# Patient Record
Sex: Female | Born: 1935 | Race: White | Hispanic: No | Smoking: Former smoker
Health system: Southern US, Community
[De-identification: ages and names within clinical notes are randomized; demographics above are authoritative.]

## PROBLEM LIST (undated history)

## (undated) DIAGNOSIS — D751 Secondary polycythemia: Secondary | ICD-10-CM

## (undated) DIAGNOSIS — T4145XA Adverse effect of unspecified anesthetic, initial encounter: Secondary | ICD-10-CM

## (undated) DIAGNOSIS — I499 Cardiac arrhythmia, unspecified: Secondary | ICD-10-CM

## (undated) DIAGNOSIS — K219 Gastro-esophageal reflux disease without esophagitis: Secondary | ICD-10-CM

## (undated) DIAGNOSIS — I219 Acute myocardial infarction, unspecified: Secondary | ICD-10-CM

## (undated) DIAGNOSIS — C801 Malignant (primary) neoplasm, unspecified: Secondary | ICD-10-CM

## (undated) DIAGNOSIS — T8859XA Other complications of anesthesia, initial encounter: Secondary | ICD-10-CM

## (undated) DIAGNOSIS — M1990 Unspecified osteoarthritis, unspecified site: Secondary | ICD-10-CM

## (undated) HISTORY — PX: EYE SURGERY: SHX253

## (undated) HISTORY — PX: SKIN CANCER EXCISION: SHX779

## (undated) HISTORY — PX: COLONOSCOPY W/ POLYPECTOMY: SHX1380

## (undated) HISTORY — PX: MASTECTOMY: SHX3

## (undated) HISTORY — PX: OTHER SURGICAL HISTORY: SHX169

## (undated) HISTORY — PX: ACHILLES TENDON SURGERY: SHX542

## (undated) HISTORY — PX: TONSILLECTOMY: SUR1361

## (undated) NOTE — *Deleted (*Deleted)
Patient is a 31 year old female presenting today for persistent hand pain in the right hand.  Patient reports she had a fall yesterday and was evaluated in the emergency room.  She had plain films of the hands and forearm which were negative for acute fracture.  She was placed in a Velcro wrist splint but reports she removed it because the pain has become severe.  She took a 47-year-old tramadol at home and reports it did not help with her pain.  Patient's hand is significantly swollen and bruised and appears that there may be an occult fracture.  There is no warmth or signs of compartment syndrome.  She has less than 3-second capillary refill in all fingers and palpable radial pulse.  Patient has no signs of injury elsewhere and denies any new falls.  Will attempt pain control.  Ice was applied hand was elevated and patient was given Voltaren gel, tramadol and Tylenol.  At this time do not feel that patient needs repeat imaging.

---
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
## 1992-08-23 HISTORY — PX: LAMINECTOMY: SHX219

## 1998-08-23 HISTORY — PX: ANKLE FRACTURE SURGERY: SHX122

## 1999-08-24 HISTORY — PX: FASCIOTOMY: SHX132

## 2003-08-24 HISTORY — PX: ABDOMINAL HYSTERECTOMY: SHX81

## 2005-08-23 HISTORY — PX: MENISCUS REPAIR: SHX5179

## 2009-12-05 DIAGNOSIS — H02409 Unspecified ptosis of unspecified eyelid: Secondary | ICD-10-CM

## 2009-12-05 HISTORY — DX: Unspecified ptosis of unspecified eyelid: H02.409

## 2010-08-23 HISTORY — PX: HERNIA REPAIR: SHX51

## 2012-07-11 DIAGNOSIS — R55 Syncope and collapse: Secondary | ICD-10-CM

## 2012-07-11 DIAGNOSIS — Z853 Personal history of malignant neoplasm of breast: Secondary | ICD-10-CM

## 2012-07-11 HISTORY — DX: Personal history of malignant neoplasm of breast: Z85.3

## 2012-07-11 HISTORY — DX: Syncope and collapse: R55

## 2012-08-23 HISTORY — PX: HAMMER TOE SURGERY: SHX385

## 2013-08-23 HISTORY — PX: SHOULDER OPEN ROTATOR CUFF REPAIR: SHX2407

## 2013-11-29 DIAGNOSIS — M75120 Complete rotator cuff tear or rupture of unspecified shoulder, not specified as traumatic: Secondary | ICD-10-CM | POA: Insufficient documentation

## 2013-11-29 HISTORY — DX: Complete rotator cuff tear or rupture of unspecified shoulder, not specified as traumatic: M75.120

## 2013-12-06 DIAGNOSIS — M7500 Adhesive capsulitis of unspecified shoulder: Secondary | ICD-10-CM | POA: Insufficient documentation

## 2013-12-06 HISTORY — DX: Adhesive capsulitis of unspecified shoulder: M75.00

## 2016-08-23 HISTORY — PX: FEMUR SURGERY: SHX943

## 2018-07-17 DIAGNOSIS — I4891 Unspecified atrial fibrillation: Secondary | ICD-10-CM

## 2018-07-17 DIAGNOSIS — D45 Polycythemia vera: Secondary | ICD-10-CM | POA: Insufficient documentation

## 2018-07-17 HISTORY — DX: Polycythemia vera: D45

## 2018-07-17 HISTORY — DX: Unspecified atrial fibrillation: I48.91

## 2018-07-24 DIAGNOSIS — I10 Essential (primary) hypertension: Secondary | ICD-10-CM

## 2018-07-24 DIAGNOSIS — L570 Actinic keratosis: Secondary | ICD-10-CM | POA: Insufficient documentation

## 2018-07-24 DIAGNOSIS — Z86006 Personal history of melanoma in-situ: Secondary | ICD-10-CM | POA: Insufficient documentation

## 2018-07-24 DIAGNOSIS — L111 Transient acantholytic dermatosis [Grover]: Secondary | ICD-10-CM | POA: Insufficient documentation

## 2018-07-24 DIAGNOSIS — Z85828 Personal history of other malignant neoplasm of skin: Secondary | ICD-10-CM

## 2018-07-24 DIAGNOSIS — Z9889 Other specified postprocedural states: Secondary | ICD-10-CM

## 2018-07-24 DIAGNOSIS — E785 Hyperlipidemia, unspecified: Secondary | ICD-10-CM

## 2018-07-24 HISTORY — DX: Actinic keratosis: L57.0

## 2018-07-24 HISTORY — DX: Personal history of melanoma in-situ: Z86.006

## 2018-07-24 HISTORY — DX: Other specified postprocedural states: Z98.890

## 2018-07-24 HISTORY — DX: Hyperlipidemia, unspecified: E78.5

## 2018-07-24 HISTORY — DX: Transient acantholytic dermatosis (grover): L11.1

## 2018-07-24 HISTORY — DX: Essential (primary) hypertension: I10

## 2018-07-24 HISTORY — DX: Personal history of other malignant neoplasm of skin: Z85.828

## 2019-01-30 DIAGNOSIS — S62327A Displaced fracture of shaft of fifth metacarpal bone, left hand, initial encounter for closed fracture: Secondary | ICD-10-CM | POA: Insufficient documentation

## 2019-03-06 ENCOUNTER — Ambulatory Visit (INDEPENDENT_AMBULATORY_CARE_PROVIDER_SITE_OTHER): Payer: Medicare Other | Admitting: Osteopathic Medicine

## 2019-03-06 ENCOUNTER — Other Ambulatory Visit: Payer: Self-pay

## 2019-03-06 ENCOUNTER — Encounter: Payer: Self-pay | Admitting: Osteopathic Medicine

## 2019-03-06 VITALS — BP 142/72 | HR 74 | Temp 98.1°F | Wt 139.1 lb

## 2019-03-06 DIAGNOSIS — D45 Polycythemia vera: Secondary | ICD-10-CM

## 2019-03-06 DIAGNOSIS — I252 Old myocardial infarction: Secondary | ICD-10-CM

## 2019-03-06 DIAGNOSIS — L821 Other seborrheic keratosis: Secondary | ICD-10-CM

## 2019-03-06 DIAGNOSIS — L603 Nail dystrophy: Secondary | ICD-10-CM | POA: Insufficient documentation

## 2019-03-06 DIAGNOSIS — L814 Other melanin hyperpigmentation: Secondary | ICD-10-CM | POA: Insufficient documentation

## 2019-03-06 DIAGNOSIS — Z8679 Personal history of other diseases of the circulatory system: Secondary | ICD-10-CM

## 2019-03-06 HISTORY — DX: Old myocardial infarction: I25.2

## 2019-03-06 HISTORY — DX: Other seborrheic keratosis: L82.1

## 2019-03-06 HISTORY — DX: Nail dystrophy: L60.3

## 2019-03-06 HISTORY — DX: Other melanin hyperpigmentation: L81.4

## 2019-03-06 NOTE — Patient Instructions (Signed)
  DR. Redgie Grayer IMPORTANT  INFORMATION FOR NEW PATIENTS!  PLEASE REVIEW CAREFULLY!    FOLLOW-UP!   Let's plan to follow-up in the office in 6 months for Medicare Wellness exam.   Long term, let's plan to follow-up every 6 months for management/monitoring of chronic medical issues, as well as management of your prescription medications.   Please don't hesitate to make an appointment sooner if you're having any acute concerns or problems!  REGARDING PRESCRIPTION MEDICATIONS  Please let your pharmacy know when you are running low on medications/refills (do not wait until you are out of medicines). Your pharmacy will send our office a request for the appropriate medications. Please allow our office 2-3 business days to process the needed refills.    REGARDING ANY CARE YOU RECEIVE OUTSIDE OUR OFFICE  At any visits to any specialists, or if you receive vaccines anywhere outside our office, please provide our clinic information so that they can forward [place] any records, including any tests which are done, changes to your medications, or new vaccinations.   Also, if you are ever treated in an emergency room or if you are admitted to the hospital, please contact our office after your discharge. We encourage our patients to schedule a follow-up visit with their PCP any time they are treated for a serious illness or injury.   This allows all your physicians to communicate effectively, putting your primary care doctor at the center of your medical care and allowing [place] to effectively coordinate your care.  Specialists can forward records to [place] at fax # [phone].    Please let us know if there is anything else we can do for you. Take care! -Dr. Loni Muse.

## 2019-03-06 NOTE — Progress Notes (Signed)
HPI: [name] is a 85 y.o. female who  has no past medical history on file.  she presents to [facility] today, 03/06/19,  for chief complaint of: New to establish care - see headings below    CARDIOVASCULAR Hx:   Atrial fibrillation  Was following w/ Dr Sheppard Plumber in [place], last visit 05/2018, goal BP <150/90, reported patient was in normal sinus rhythm at that point.  She says she has upcoming appointment with a new local cardiologist. 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 Hydroxyurea initiated June 2017 secondary to rising platelet count and leukocytosis. Current dosing 500 mg twice daily Monday through Friday, daily on Saturday and Sunday. Goal hematocrit less than 42%."  S/p bilateral mastectomy for breast cancer  Meds:   Hydroxyurea        Past medical, surgical, social and family history reviewed:  Patient Active Problem List   Diagnosis Date Noted  . Brittle nails 03/06/2019  . Lentigines 03/06/2019  . Past myocardial infarction 03/06/2019  . Seborrheic keratosis 03/06/2019  . Closed fracture of shaft of fifth metacarpal bone of left hand 01/30/2019  . Actinic keratoses 07/24/2018  . Benign essential HTN 07/24/2018  . 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 Hx of basal cell carcinoma excision 07/24/2018  . 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 Hx of squamous cell carcinoma of skin 07/24/2018  . Hyperlipidemia 07/24/2018  . Atrial fibrillation ([place]) 07/17/2018  . Polycythemia vera ([place]) 07/17/2018  . Adhesive capsulitis of shoulder 12/06/2013  . Complete rupture of rotator cuff 11/29/2013  . History of breast cancer 07/11/2012  . 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 Ptosis of eyelid 12/05/2009    History reviewed. No  pertinent surgical history.  Social History   Tobacco Use  . Smoking status: Not on file  Substance Use Topics  . Alcohol use: Not on file    No family history on file.   Current medication list and allergy/intolerance information reviewed:    No current outpatient medications on file.   No current facility-administered medications for this visit.     Not on File    Review of Systems:  Constitutional:  No  fever, no chills, No recent illness, No unintentional weight changes. No significant fatigue.   HEENT: No  headache, no vision change, no hearing change, No sore throat, No  sinus pressure  Cardiac: No  chest pain, No  pressure, No palpitations, No  Orthopnea  Respiratory:  No  shortness of breath. No  Cough  Gastrointestinal: No  abdominal pain, No  nausea, No  vomiting,  No  blood in stool, No  diarrhea, No  constipation   Musculoskeletal: No new myalgia/arthralgia  Skin: No  Rash, No other wounds/concerning lesions  Genitourinary: No  incontinence, No  abnormal genital bleeding, No abnormal genital discharge  Hem/Onc: No  easy bruising/bleeding, No  abnormal lymph node  Endocrine: No cold intolerance,  No heat intolerance. No polyuria/polydipsia/polyphagia   Neurologic: No  weakness, No  dizziness, No  slurred speech/focal weakness/facial droop  Psychiatric: No  concerns with depression, No  concerns with anxiety, No sleep problems, No mood problems  Exam:  BP (!) 142/72 (BP Location: Left Arm, Patient Position: Sitting, Cuff Size: Normal)   Pulse 74   Temp 98.1  F (36.7 C) (Oral)   Wt 139 lb 1.6 oz (63.1 kg)   SpO2 98%   Constitutional: VS see above. General Appearance: alert, well-developed, well-nourished, NAD  Eyes: Normal lids and conjunctive, non-icteric sclera  Neck: No masses, trachea midline. No thyroid enlargement. No tenderness/mass appreciated. No lymphadenopathy  Respiratory: Normal respiratory effort. no wheeze, no rhonchi, no  rales  Cardiovascular: S1/S2 normal, no murmur, no rub/gallop auscultated. RRR. No lower extremity edema.  Gastrointestinal: Nontender, no masses. No hepatomegaly, no splenomegaly. No hernia appreciated. Bowel sounds normal. Rectal exam deferred.   Musculoskeletal: Gait normal. No clubbing/cyanosis of digits.   Neurological: Normal balance/coordination. No tremor. No cranial nerve deficit on limited exam. Motor and sensation intact and symmetric. Cerebellar reflexes intact.   Skin: warm, dry, intact. No rash/ulcer. No concerning nevi or subq nodules on limited exam.    Psychiatric: Normal judgment/insight. Normal mood and affect. Oriented x3.    No results found for this or any previous visit (from the past 72 hour(s)).  No results found.   ASSESSMENT/PLAN: The primary encounter diagnosis was Polycythemia vera ([place]). A diagnosis of History of atrial fibrillation was also pertinent to this visit.  Patient doing fairly well on current medications, following with several different specialists and seems like everything is pretty well under control.  She has no complaints today.  Advised follow-up with me every 6 months, sooner if needed.    Patient Instructions   DR. Octave Montrose'S IMPORTANT  INFORMATION FOR NEW PATIENTS!  PLEASE REVIEW CAREFULLY!    FOLLOW-UP!   Let's plan to follow-up in the office in 6 months for Medicare Wellness exam.   Long term, let's plan to follow-up every 6 months for management/monitoring of chronic medical issues, as well as management of your prescription medications.   Please don't hesitate to make an appointment sooner if you're having any acute concerns or problems!  REGARDING PRESCRIPTION MEDICATIONS  Please let your pharmacy know when you are running low on medications/refills (do not wait until you are out of medicines). Your pharmacy will send our office a request for the appropriate medications. Please allow our office 2-3 business days to  process the needed refills.    REGARDING ANY CARE YOU RECEIVE OUTSIDE OUR OFFICE  At any visits to any specialists, or if you receive vaccines anywhere outside our office, please provide our clinic information so that they can forward [place] any records, including any tests which are done, changes to your medications, or new vaccinations.   Also, if you are ever treated in an emergency room or if you are admitted to the hospital, please contact our office after your discharge. We encourage our patients to schedule a follow-up visit with their PCP any time they are treated for a serious illness or injury.   This allows all your physicians to communicate effectively, putting your primary care doctor at the center of your medical care and allowing [place] to effectively coordinate your care.  Specialists can forward records to [place] at fax # [phone].    Please let us know if there is anything else we can do for you. Take care! -Dr. Loni Muse.        Visit summary with medication list and pertinent instructions was printed for patient to review. All questions at time of visit were answered - patient instructed to contact office with any additional concerns or updates. ER/RTC precautions were reviewed with the patient.   Note: Total time spent 45 minutes, greater than 50% of the visit  was spent face-to-face counseling and coordinating care for the above diagnoses listed in assessment/plan.   Please note: voice recognition software was used to produce this document, and typos may escape review. Please contact Dr. Sheppard Coil for any needed clarifications.     Follow-up plan: Return in about 6 months (around 09/06/2019) for [place] .

## 2019-03-09 ENCOUNTER — Encounter: Payer: Self-pay | Admitting: Osteopathic Medicine

## 2019-06-26 ENCOUNTER — Telehealth: Payer: Self-pay | Admitting: Osteopathic Medicine

## 2019-06-26 NOTE — Telephone Encounter (Signed)
If records are being requested, that's fine, get appropriate releases from patient and ok to send.  If needs surgical clearance, will need appt. Sounds like shes just asking for records.

## 2019-06-26 NOTE — Telephone Encounter (Signed)
Pt called. She is having surgery for a knee replacement on December 1. She is requesting that we send off for her records from her previous pcp. Do you need to do surgical clearance? Thanks.

## 2019-06-27 ENCOUNTER — Other Ambulatory Visit: Payer: Self-pay | Admitting: Orthopaedic Surgery

## 2019-06-27 NOTE — Telephone Encounter (Signed)
I shouldn't need records, usually everything the surgeon would need to know needs to be done within a few weeks of surgery anyway (updated labs etc). She should check with her surgeon - if they are requesting surgical clearance, she will need appointment with me, and we will need forms from her surgeon (she can bring forms with her to appointment or request that her surgeon fax them to [place])

## 2019-06-27 NOTE — Telephone Encounter (Signed)
Okay. Patient is aware.  Thank  You.

## 2019-06-27 NOTE — Telephone Encounter (Signed)
I apologize, I didn't word the message correctly.  She is requesting records so that  you can have them to do her surgical clearance for her knee replacement. My question is, do you need records to do surgical clearance for knee replacement?  Her appointment is on December 1st.

## 2019-06-28 NOTE — Telephone Encounter (Signed)
[facility] Pre-Op form for pt placed in provider's box.

## 2019-06-29 NOTE — Telephone Encounter (Signed)
Great.  Thanks

## 2019-07-11 ENCOUNTER — Other Ambulatory Visit: Payer: Self-pay | Admitting: Orthopaedic Surgery

## 2019-07-11 NOTE — Care Plan (Signed)
Multiple conversations with patient prior to surgery. She plans to discharge to the [place] facility at [facility] for several days prior to returning to her apartment there. I have spoken with Luellen Pucker, admissions, [phone], and arrangements have been made for this. Please contact her for transportation at discharge.  Patient will need a rolling walker but this will be obtained at the facility. Orders for therapy have been sent to [facility] at [facility].  Patient and MD in agreement with plan. Choice offered.    Ladell Heads, [place]

## 2019-07-17 NOTE — Patient Instructions (Signed)
DUE TO COVID-19 ONLY ONE VISITOR IS ALLOWED TO COME WITH YOU AND STAY IN THE WAITING ROOM ONLY DURING PRE OP AND PROCEDURE DAY OF SURGERY. THE 1 VISITOR MAY VISIT WITH YOU AFTER SURGERY IN YOUR PRIVATE ROOM DURING VISITING HOURS ONLY!  YOU NEED TO HAVE A COVID 19 TEST ON___11-27____ @_______ , THIS TEST MUST BE DONE BEFORE SURGERY, COME  [place] , [id number].  ([place]) ONCE YOUR COVID TEST IS COMPLETED, PLEASE BEGIN THE QUARANTINE INSTRUCTIONS AS OUTLINED IN YOUR HANDOUT.                [name]    Your procedure is scheduled on: 12-1   Report to [facility]  Entrance    Report to admitting at 7:30AM     Call this number if you have problems the morning of surgery [phone]    NO SOLID FOOD AFTER MIDNIGHT THE NIGHT PRIOR TO SURGERY. YOU MAY DRINK CLEAR LIQUIDS.   STOP CLEAR LIQUIDS AT ___7:00AM______ AND THEN DRINK THE ENSURE PRE-SURGERY [place]. NOTHING BY MOUTH AFTER THE ENSURE DRINK!    CLEAR LIQUID DIET   Foods Allowed                                                                     Foods Excluded  Coffee and tea, regular and decaf                             liquids that you cannot  Plain Jell-O any favor except red or purple                                           see through such as: Fruit ices (not with fruit pulp)                                     milk, soups, orange juice  Iced Popsicles                                    All solid food Carbonated beverages, regular and diet                                    Cranberry, grape and apple juices Sports drinks like Gatorade Lightly seasoned clear broth or consume(fat free) Sugar, honey syrup  Sample Menu Breakfast                                Lunch                                     Supper Cranberry juice  Beef broth                            Chicken broth Jell-O                                     Grape juice                            Apple juice Coffee or tea                        Jell-O                                      Popsicle                                                Coffee or tea                        Coffee or tea  _____________________________________________________________________  . BRUSH YOUR TEETH MORNING OF SURGERY AND RINSE YOUR MOUTH OUT, NO CHEWING GUM CANDY OR MINTS.     Take these medicines the morning of surgery with A SIP OF WATER:                                  You may not have any metal on your body including hair pins and              piercings  Do not wear jewelry, make-up, lotions, powders or perfumes, deodorant             Do not wear nail polish on your fingernails.  Do not shave  48 hours prior to surgery.              Do not bring valuables to the hospital. [place].  Contacts, dentures or bridgework may not be worn into surgery.  YOU MAY BRING A SMALL OVERNIGHT BAG.                  Please read over the following fact sheets you were given: _____________________________________________________________________             [facility] - Preparing for Surgery Before surgery, you can play an important role.  Because skin is not sterile, your skin needs to be as free of germs as possible.  You can reduce the number of germs on your skin by washing with CHG (chlorahexidine gluconate) soap before surgery.  CHG is an antiseptic cleaner which kills germs and bonds with the skin to continue killing germs even after washing. Please DO NOT use if you have an allergy to CHG or antibacterial soaps.  If your skin becomes reddened/irritated stop using the CHG and inform your nurse when you arrive at Short Stay. Do not shave (including legs and underarms) for at least 48 hours prior to the first  CHG shower.  You may shave your face/neck. Please follow these instructions carefully:  1.  Shower with CHG Soap the night before surgery  and the  morning of Surgery.  2.  If you choose to wash your hair, wash your hair first as usual with your  normal  shampoo.  3.  After you shampoo, rinse your hair and body thoroughly to remove the  shampoo.                           4.  Use CHG as you would any other liquid soap.  You can apply chg directly  to the skin and wash                       Gently with a scrungie or clean washcloth.  5.  Apply the CHG Soap to your body ONLY FROM THE NECK DOWN.   Do not use on face/ open                           Wound or open sores. Avoid contact with eyes, ears mouth and genitals (private parts).                       Wash face,  Genitals (private parts) with your normal soap.             6.  Wash thoroughly, paying special attention to the area where your surgery  will be performed.  7.  Thoroughly rinse your body with warm water from the neck down.  8.  DO NOT shower/wash with your normal soap after using and rinsing off  the CHG Soap.                9.  Pat yourself dry with a clean towel.            10.  Wear clean pajamas.            11.  Place clean sheets on your bed the night of your first shower and do not  sleep with pets. Day of Surgery : Do not apply any lotions/deodorants the morning of surgery.  Please wear clean clothes to the hospital/surgery center.  FAILURE TO FOLLOW THESE INSTRUCTIONS MAY RESULT IN THE CANCELLATION OF YOUR SURGERY PATIENT SIGNATURE_________________________________  NURSE SIGNATURE__________________________________  ________________________________________________________________________   [name]  An incentive spirometer is a tool that can help keep your lungs clear and active. This tool measures how well you are filling your lungs with each breath. Taking long deep breaths may help reverse or decrease the chance of developing breathing (pulmonary) problems (especially infection) following:  A long period of time when you are unable to move or  be active. BEFORE THE PROCEDURE   If the spirometer includes an indicator to show your best effort, your nurse or respiratory therapist will set it to a desired goal.  If possible, sit up straight or lean slightly forward. Try not to slouch.  Hold the incentive spirometer in an upright position. INSTRUCTIONS FOR USE  1. Sit on the edge of your bed if possible, or sit up as far as you can in bed or on a chair. 2. Hold the incentive spirometer in an upright position. 3. Breathe out normally. 4. Place the mouthpiece in your mouth and seal your lips tightly around it. 5. Breathe in slowly and as  deeply as possible, raising the piston or the ball toward the top of the column. 6. Hold your breath for 3-5 seconds or for as long as possible. Allow the piston or ball to fall to the bottom of the column. 7. Remove the mouthpiece from your mouth and breathe out normally. 8. Rest for a few seconds and repeat Steps 1 through 7 at least 10 times every 1-2 hours when you are awake. Take your time and take a few normal breaths between deep breaths. 9. The spirometer may include an indicator to show your best effort. Use the indicator as a goal to work toward during each repetition. 10. After each set of 10 deep breaths, practice coughing to be sure your lungs are clear. If you have an incision (the cut made at the time of surgery), support your incision when coughing by placing a pillow or rolled up towels firmly against it. Once you are able to get out of bed, walk around indoors and cough well. You may stop using the incentive spirometer when instructed by your caregiver.  RISKS AND COMPLICATIONS  Take your time so you do not get dizzy or light-headed.  If you are in pain, you may need to take or ask for pain medication before doing incentive spirometry. It is harder to take a deep breath if you are having pain. AFTER USE  Rest and breathe slowly and easily.  It can be helpful to keep track of a log  of your progress. Your caregiver can provide you with a simple table to help with this. If you are using the spirometer at home, follow these instructions: [place] IF:   You are having difficultly using the spirometer.  You have trouble using the spirometer as often as instructed.  Your pain medication is not giving enough relief while using the spirometer.  You develop fever of 100.5 F (38.1 C) or higher. SEEK IMMEDIATE MEDICAL CARE IF:   You cough up bloody sputum that had not been present before.  You develop fever of 102 F (38.9 C) or greater.  You develop worsening pain at or near the incision site. MAKE SURE YOU:   Understand these instructions.  Will watch your condition.  Will get help right away if you are not doing well or get worse. Document Released: 12/20/2006 Document Revised: 11/01/2011 Document Reviewed: 02/20/2007 [facility] Patient Information 2014 [place], [place].   ________________________________________________________________________

## 2019-07-18 ENCOUNTER — Encounter (HOSPITAL_COMMUNITY)
Admission: RE | Admit: 2019-07-18 | Discharge: 2019-07-18 | Disposition: A | Discharge status: Home / Self Care | Payer: Medicare Other | Source: Ambulatory Visit | Attending: Osteopathic Medicine | Admitting: Osteopathic Medicine

## 2019-07-24 ENCOUNTER — Encounter (HOSPITAL_COMMUNITY): Admission: RE | Payer: Self-pay | Source: Home / Self Care

## 2019-07-24 ENCOUNTER — Ambulatory Visit (HOSPITAL_COMMUNITY): Admission: RE | Admit: 2019-07-24 | Payer: Medicare Other | Source: Home / Self Care | Admitting: Orthopaedic Surgery

## 2019-07-24 SURGERY — ARTHROPLASTY, KNEE, TOTAL
Anesthesia: Spinal | Site: Knee | Laterality: Right

## 2019-08-07 ENCOUNTER — Telehealth: Payer: Self-pay

## 2019-08-07 NOTE — Telephone Encounter (Signed)
NOTES FROM [place]

## 2019-08-07 NOTE — Telephone Encounter (Signed)
NOTES ON FILE FROM [facility] IN [place] , SENT REFERRAL TO SCHEDULING

## 2019-08-09 ENCOUNTER — Telehealth: Payer: Self-pay

## 2019-08-09 NOTE — Telephone Encounter (Signed)
NOTES ON FILE FROM [facility] [phone] SENT REFERRAL TO [place]

## 2019-08-20 ENCOUNTER — Other Ambulatory Visit: Payer: Self-pay | Admitting: Orthopaedic Surgery

## 2019-08-28 ENCOUNTER — Encounter: Payer: Medicare Other | Admitting: Osteopathic Medicine

## 2019-08-28 NOTE — Patient Instructions (Addendum)
DUE TO COVID-19 ONLY ONE VISITOR IS ALLOWED TO COME WITH YOU AND STAY IN THE WAITING ROOM ONLY DURING PRE OP AND PROCEDURE DAY OF SURGERY. THE 1 VISITOR MAY VISIT WITH YOU AFTER SURGERY IN YOUR PRIVATE ROOM DURING VISITING HOURS ONLY!  YOU NEED TO HAVE A COVID 19 TEST ON 08-31-19 @ 11:15 AM, THIS TEST MUST BE DONE BEFORE SURGERY, COME  [name], [name] , [address].  ([place]) ONCE YOUR COVID TEST IS COMPLETED, PLEASE BEGIN THE QUARANTINE INSTRUCTIONS AS OUTLINED IN YOUR HANDOUT.                [name]  08/28/2019   Your procedure is scheduled on: 09-04-19   Report to [facility] Main  Entrance    Report to Admitting at 9:53 AM     Call this number if you have problems the morning of surgery [phone]    Remember: NO SOLID FOOD AFTER MIDNIGHT THE NIGHT PRIOR TO SURGERY. NOTHING BY MOUTH EXCEPT CLEAR LIQUIDS UNTIL 9:23 AM . PLEASE FINISH ENSURE DRINK PER SURGEON ORDER  WHICH NEEDS TO BE COMPLETED AT 9:23 AM .   CLEAR LIQUID DIET   Foods Allowed                                                                     Foods Excluded  Coffee and tea, regular and decaf                             liquids that you cannot  Plain Jell-O any favor except red or purple                                           see through such as: Fruit ices (not with fruit pulp)                                     milk, soups, orange juice  Iced Popsicles                                    All solid food Carbonated beverages, regular and diet                                    Cranberry, grape and apple juices Sports drinks like Gatorade Lightly seasoned clear broth or consume(fat free) Sugar, honey syrup  Sample Menu Breakfast                                Lunch                                     Supper Cranberry juice  Beef broth                            Chicken broth Jell-O                                     Grape juice                            Apple juice Coffee or tea                        Jell-O                                      Popsicle                                                Coffee or tea                        Coffee or tea  _____________________________________________________________________        Take these medicines the morning of surgery with A SIP OF WATER: None  BRUSH YOUR TEETH MORNING OF SURGERY AND RINSE YOUR MOUTH OUT, NO CHEWING GUM CANDY OR MINTS.                              You may not have any metal on your body including hair pins and              piercings     Do not wear jewelry, make-up, lotions, powders or perfumes, deodorant              Do not wear nail polish on your fingernails.  Do not shave  48 hours prior to surgery.              Do not bring valuables to the hospital. [place].  Contacts, dentures or bridgework may not be worn into surgery.  You may bring an overnight bag     Special Instructions: N/A              Please read over the following fact sheets you were given: _____________________________________________________________________             [facility] - Preparing for Surgery Before surgery, you can play an important role.  Because skin is not sterile, your skin needs to be as free of germs as possible.  You can reduce the number of germs on your skin by washing with CHG (chlorahexidine gluconate) soap before surgery.  CHG is an antiseptic cleaner which kills germs and bonds with the skin to continue killing germs even after washing. Please DO NOT use if you have an allergy to CHG or antibacterial soaps.  If your skin becomes reddened/irritated stop using the CHG and inform your nurse when you arrive at Short Stay. Do not shave (including legs and underarms) for at least 48  hours prior to the first CHG shower.  You may shave your face/neck. Please follow these instructions carefully:  1.  Shower with  CHG Soap the night before surgery and the  morning of Surgery.  2.  If you choose to wash your hair, wash your hair first as usual with your  normal  shampoo.  3.  After you shampoo, rinse your hair and body thoroughly to remove the  shampoo.                           4.  Use CHG as you would any other liquid soap.  You can apply chg directly  to the skin and wash                       Gently with a scrungie or clean washcloth.  5.  Apply the CHG Soap to your body ONLY FROM THE NECK DOWN.   Do not use on face/ open                           Wound or open sores. Avoid contact with eyes, ears mouth and genitals (private parts).                       Wash face,  Genitals (private parts) with your normal soap.             6.  Wash thoroughly, paying special attention to the area where your surgery  will be performed.  7.  Thoroughly rinse your body with warm water from the neck down.  8.  DO NOT shower/wash with your normal soap after using and rinsing off  the CHG Soap.                9.  Pat yourself dry with a clean towel.            10.  Wear clean pajamas.            11.  Place clean sheets on your bed the night of your first shower and do not  sleep with pets. Day of Surgery : Do not apply any lotions/deodorants the morning of surgery.  Please wear clean clothes to the hospital/surgery center.  FAILURE TO FOLLOW THESE INSTRUCTIONS MAY RESULT IN THE CANCELLATION OF YOUR SURGERY PATIENT SIGNATURE_________________________________  NURSE SIGNATURE__________________________________  ________________________________________________________________________   [name]  An incentive spirometer is a tool that can help keep your lungs clear and active. This tool measures how well you are filling your lungs with each breath. Taking long deep breaths may help reverse or decrease the chance of developing breathing (pulmonary) problems (especially infection) following:  A long period of  time when you are unable to move or be active. BEFORE THE PROCEDURE   If the spirometer includes an indicator to show your best effort, your nurse or respiratory therapist will set it to a desired goal.  If possible, sit up straight or lean slightly forward. Try not to slouch.  Hold the incentive spirometer in an upright position. INSTRUCTIONS FOR USE  1. Sit on the edge of your bed if possible, or sit up as far as you can in bed or on a chair. 2. Hold the incentive spirometer in an upright position. 3. Breathe out normally. 4. Place the mouthpiece in your mouth and seal your lips tightly around it. 5.  Breathe in slowly and as deeply as possible, raising the piston or the ball toward the top of the column. 6. Hold your breath for 3-5 seconds or for as long as possible. Allow the piston or ball to fall to the bottom of the column. 7. Remove the mouthpiece from your mouth and breathe out normally. 8. Rest for a few seconds and repeat Steps 1 through 7 at least 10 times every 1-2 hours when you are awake. Take your time and take a few normal breaths between deep breaths. 9. The spirometer may include an indicator to show your best effort. Use the indicator as a goal to work toward during each repetition. 10. After each set of 10 deep breaths, practice coughing to be sure your lungs are clear. If you have an incision (the cut made at the time of surgery), support your incision when coughing by placing a pillow or rolled up towels firmly against it. Once you are able to get out of bed, walk around indoors and cough well. You may stop using the incentive spirometer when instructed by your caregiver.  RISKS AND COMPLICATIONS  Take your time so you do not get dizzy or light-headed.  If you are in pain, you may need to take or ask for pain medication before doing incentive spirometry. It is harder to take a deep breath if you are having pain. AFTER USE  Rest and breathe slowly and easily.  It can  be helpful to keep track of a log of your progress. Your caregiver can provide you with a simple table to help with this. If you are using the spirometer at home, follow these instructions: [place] IF:   You are having difficultly using the spirometer.  You have trouble using the spirometer as often as instructed.  Your pain medication is not giving enough relief while using the spirometer.  You develop fever of 100.5 F (38.1 C) or higher. SEEK IMMEDIATE MEDICAL CARE IF:   You cough up bloody sputum that had not been present before.  You develop fever of 102 F (38.9 C) or greater.  You develop worsening pain at or near the incision site. MAKE SURE YOU:   Understand these instructions.  Will watch your condition.  Will get help right away if you are not doing well or get worse. Document Released: 12/20/2006 Document Revised: 11/01/2011 Document Reviewed: 02/20/2007 ExitCare Patient Information 2014 ExitCare, [place].   ________________________________________________________________________  WHAT IS A BLOOD TRANSFUSION? Blood Transfusion Information  A transfusion is the replacement of blood or some of its parts. Blood is made up of multiple cells which provide different functions.  Red blood cells carry oxygen and are used for blood loss replacement.  White blood cells fight against infection.  Platelets control bleeding.  Plasma helps clot blood.  Other blood products are available for specialized needs, such as hemophilia or other clotting disorders. BEFORE THE TRANSFUSION  Who gives blood for transfusions?   Healthy volunteers who are fully evaluated to make sure their blood is safe. This is blood bank blood. Transfusion therapy is the safest it has ever been in the practice of medicine. Before blood is taken from a donor, a complete history is taken to make sure that person has no history of diseases nor engages in risky social behavior (examples are  intravenous drug use or sexual activity with multiple partners). The donor's travel history is screened to minimize risk of transmitting infections, such as malaria. The donated blood is  tested for signs of infectious diseases, such as HIV and hepatitis. The blood is then tested to be sure it is compatible with you in order to minimize the chance of a transfusion reaction. If you or a relative donates blood, this is often done in anticipation of surgery and is not appropriate for emergency situations. It takes many days to process the donated blood. RISKS AND COMPLICATIONS Although transfusion therapy is very safe and saves many lives, the main dangers of transfusion include:   Getting an infectious disease.  Developing a transfusion reaction. This is an allergic reaction to something in the blood you were given. Every precaution is taken to prevent this. The decision to have a blood transfusion has been considered carefully by your caregiver before blood is given. Blood is not given unless the benefits outweigh the risks. AFTER THE TRANSFUSION  Right after receiving a blood transfusion, you will usually feel much better and more energetic. This is especially true if your red blood cells have gotten low (anemic). The transfusion raises the level of the red blood cells which carry oxygen, and this usually causes an energy increase.  The nurse administering the transfusion will monitor you carefully for complications. HOME CARE INSTRUCTIONS  No special instructions are needed after a transfusion. You may find your energy is better. Speak with your caregiver about any limitations on activity for underlying diseases you may have. SEEK MEDICAL CARE IF:   Your condition is not improving after your transfusion.  You develop redness or irritation at the intravenous (IV) site. SEEK IMMEDIATE MEDICAL CARE IF:  Any of the following symptoms occur over the next 12 hours:  Shaking chills.  You have a  temperature by mouth above 102 F (38.9 C), not controlled by medicine.  Chest, back, or muscle pain.  People around you feel you are not acting correctly or are confused.  Shortness of breath or difficulty breathing.  Dizziness and fainting.  You get a rash or develop hives.  You have a decrease in urine output.  Your urine turns a dark color or changes to pink, red, or brown. Any of the following symptoms occur over the next 10 days:  You have a temperature by mouth above 102 F (38.9 C), not controlled by medicine.  Shortness of breath.  Weakness after normal activity.  The white part of the eye turns yellow (jaundice).  You have a decrease in the amount of urine or are urinating less often.  Your urine turns a dark color or changes to pink, red, or brown. Document Released: 08/06/2000 Document Revised: 11/01/2011 Document Reviewed: 03/25/2008 [facility] Patient Information 2014 [place], [place].  _______________________________________________________________________

## 2019-08-29 ENCOUNTER — Encounter (HOSPITAL_COMMUNITY)
Admission: RE | Admit: 2019-08-29 | Discharge: 2019-08-29 | Disposition: A | Discharge status: Home / Self Care | Payer: Medicare Other | Source: Ambulatory Visit | Attending: Orthopaedic Surgery | Admitting: Orthopaedic Surgery

## 2019-08-29 ENCOUNTER — Encounter (HOSPITAL_COMMUNITY): Payer: Self-pay | Admitting: *Deleted

## 2019-08-29 ENCOUNTER — Other Ambulatory Visit: Payer: Self-pay

## 2019-08-29 DIAGNOSIS — Z01812 Encounter for preprocedural laboratory examination: Secondary | ICD-10-CM | POA: Insufficient documentation

## 2019-08-29 HISTORY — DX: Unspecified osteoarthritis, unspecified site: M19.90

## 2019-08-29 HISTORY — DX: Gastro-esophageal reflux disease without esophagitis: K21.9

## 2019-08-29 HISTORY — DX: Secondary polycythemia: D75.1

## 2019-08-29 HISTORY — DX: Cardiac arrhythmia, unspecified: I49.9

## 2019-08-29 HISTORY — DX: Malignant (primary) neoplasm, unspecified: C80.1

## 2019-08-29 HISTORY — DX: Other complications of anesthesia, initial encounter: T88.59XA

## 2019-08-29 HISTORY — DX: Acute myocardial infarction, unspecified: I21.9

## 2019-08-30 ENCOUNTER — Encounter (HOSPITAL_COMMUNITY): Payer: Self-pay | Admitting: Physician Assistant

## 2019-08-31 ENCOUNTER — Other Ambulatory Visit (HOSPITAL_COMMUNITY): Payer: Medicare Other

## 2019-08-31 ENCOUNTER — Encounter (HOSPITAL_COMMUNITY)
Admission: RE | Admit: 2019-08-31 | Payer: Medicare Other | Source: Ambulatory Visit | Attending: Orthopedic Surgery | Admitting: Orthopedic Surgery

## 2019-09-04 ENCOUNTER — Ambulatory Visit (HOSPITAL_COMMUNITY): Admission: RE | Admit: 2019-09-04 | Payer: Medicare Other | Source: Ambulatory Visit | Admitting: Orthopaedic Surgery

## 2019-09-04 ENCOUNTER — Encounter (HOSPITAL_COMMUNITY): Admission: RE | Payer: Self-pay | Source: Ambulatory Visit

## 2019-09-04 SURGERY — ARTHROPLASTY, KNEE, TOTAL
Anesthesia: Spinal | Site: Knee | Laterality: Right

## 2019-10-01 NOTE — Progress Notes (Signed)
CARDIOLOGY CONSULT NOTE       Patient ID: [name] MRN: [id number] DOB/AGE: [birth year] 86 y.o.  Admit date: (Not on file) Referring Physician: Sheppard Coil  Primary Physician: Emeterio Reeve, DO Primary Cardiologist: New Reason for Consultation: Afib  Active Problems:   * No active hospital problems. *   HPI:  86 y.o. history of HLD, HTN and afib. Referred by Dr Sheppard Coil  Previously seen by [facility] in [place] Dr Sheppard Plumber in [place] until 05/2018 She is on chronic anticoagulation with Eliquis She also has polycythemia currently on Hydroxyurea . Breast Cancer post bilateral mastectomy PAF but has been in sinus since 2015 on flecainide [place] VASC 5 on eliquis Echo done 2018 had mild/moderate MR/TR with normal EF and mild/moderate LVH Cath in 2016 with no CAD Her BP can be labile Associated with anxiety.  she has had episodes of vasovagal syncope in past These may have been precipitated by hydralazine   She is widowed 5 years. Husband worked in Charity fundraiser and they enjoyed going to market in [place] they retired here She has a daughter in [place] and on in [place]. She enjoys golf  No cardiac issues Hct around 42 has not needed phlebotomy in 3 years. Had to have right brachial arterial Repair from stray iv stick at that time  Does not note any palpitations while in room with PVCls   ROS All other systems reviewed and negative except as noted above  Past Medical History:  Diagnosis Date  . Actinic keratoses 07/24/2018  . Adhesive capsulitis of shoulder 12/06/2013  . Arthritis   . Atrial fibrillation ([place]) 07/17/2018  . Benign essential HTN 07/24/2018   Last Assessment & Plan:  She is doing better with adjustments in her blood pressure medication and will continue this monitoring. Last Assessment & Plan:  She is doing better with adjustments in her blood pressure medication and will continue this monitoring.  . Brittle nails 03/06/2019  . Cancer ([place])    Breast  . Complete rupture of rotator cuff 11/29/2013  . Complication of anesthesia   . Dysrhythmia    A-Fib  . GERD (gastroesophageal reflux disease)   . 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 History of breast cancer 07/11/2012   Bilateral mastectomies with reconstruction.  Marland Kitchen Hx of basal cell carcinoma excision 07/24/2018  . 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 Hx of squamous cell carcinoma of skin 07/24/2018  . Hyperlipidemia 07/24/2018  . Lentigines 03/06/2019  . Myocardial infarction ([place])    Silent Heart Attack  . Past myocardial infarction 03/06/2019  . Polycythemia   . Polycythemia vera ([place]) 07/17/2018   Last Assessment & Plan:  The patient's blood counts remain in the desired range on her current dose of Hydrea.  She will continue this medication without dose modification.  She is moving to [facility] next week and we will help her arrange for necessary follow-up there.  She will return here on an as-needed basis.  . Ptosis of eyelid 12/05/2009  . Seborrheic keratosis 03/06/2019  . 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    Family History  Problem Relation Age of Onset  . Heart attack Father     Social History   Socioeconomic History  . Marital status: Widowed    Spouse name: Not on file  . Number of children: Not on file  . Years of education: Not on file  . Highest education level: Not on file  Occupational History  . Not on file  Tobacco Use  . Smoking status: Never Smoker  . Smokeless tobacco: Never Used  Substance and Sexual Activity  . Alcohol use: Not Currently  . Drug use: Never  . Sexual activity: Not Currently  Other Topics Concern  . Not on file  Social History Narrative  . Not on file   Social Determinants of Health   Financial Resource Strain:   . Difficulty of Paying Living Expenses: Not on file  Food Insecurity:   . Worried About Charity fundraiser in the Last Year: Not on file  . Ran Out of Food in the Last Year: Not on file  Transportation Needs:   . Lack  of Transportation (Medical): Not on file  . Lack of Transportation (Non-Medical): Not on file  Physical Activity:   . Days of Exercise per Week: Not on file  . Minutes of Exercise per Session: Not on file  Stress:   . Feeling of Stress : Not on file  Social Connections:   . Frequency of Communication with Friends and Family: Not on file  . Frequency of Social Gatherings with Friends and Family: Not on file  . Attends Religious Services: Not on file  . Active Member of Clubs or Organizations: Not on file  . Attends Archivist Meetings: Not on file  . Marital Status: Not on file  Intimate Partner Violence:   . Fear of Current or Ex-Partner: Not on file  . Emotionally Abused: Not on file  . Physically Abused: Not on file  . Sexually Abused: Not on file    Past Surgical History:  Procedure Laterality Date  . ABDOMINAL HYSTERECTOMY  2005  . ACHILLES TENDON SURGERY Right    2008  . ANKLE FRACTURE SURGERY Left 2000  . Breast Recontstruction Right    2004  . COLONOSCOPY W/ POLYPECTOMY    . EYE SURGERY Bilateral    Cataract  . FASCIOTOMY  2001  . FEMUR SURGERY  2018   Crushed Femur,   . HAMMER TOE SURGERY Bilateral 2014  . HERNIA REPAIR  2012   abdominal  . LAMINECTOMY  1994   L3-L4, L4-L5  (1987 C2-C3, C3-C4)  . MASTECTOMY Bilateral    1997-R, L -2004  . MENISCUS REPAIR Right 2007   arthroscopy  . SHOULDER OPEN ROTATOR CUFF REPAIR Right 2015  . SKIN CANCER EXCISION     basal, squamous, melonoma  . TONSILLECTOMY        Current Outpatient Medications:  .  apixaban (ELIQUIS) 5 MG TABS tablet, Take 5 mg by mouth 2 (two) times daily. , Disp: , Rfl:  .  Ascorbic Acid (VITAMIN C PO), Take 3 tablets by mouth daily., Disp: , Rfl:  .  Cholecalciferol (VITAMIN D3) 50 MCG (2000 UT) TABS, Take 2,000 Units by mouth daily., Disp: , Rfl:  .  Coenzyme Q10 (CO Q-10) 50 MG CAPS, Take 50 mg by mouth daily. , Disp: , Rfl:  .  hydroxyurea (HYDREA) 500 MG capsule, Take 500 mg by  mouth See admin instructions. Take 500 mg by mouth twice daily Monday-Friday and once daily on Saturday and Sunday, Disp: , Rfl:  .  losartan (COZAAR) 25 MG tablet, Take 25 mg by mouth as needed., Disp: , Rfl:  .  MAGNESIUM OXIDE 400 PO, Take 400 mg by mouth daily. , Disp: , Rfl:  .  Melatonin 10 MG TABS, Take 10 mg by mouth at bedtime. , Disp: , Rfl:  .  Multiple Vitamins-Minerals (MULTIVITAMIN ADULT PO), Take  1 tablet by mouth daily. , Disp: , Rfl:  .  Omega-3 Fatty Acids (OMEGA-3 EPA FISH OIL PO), Take 1 capsule by mouth 2 (two) times daily. EPA 300 mg DHA 200 mg, Disp: , Rfl:  .  OVER THE COUNTER MEDICATION, Take 300 mg by mouth daily. Green tea , Disp: , Rfl:     Physical Exam: Blood pressure 140/84, pulse 69, height 5\' 8"  (1.727 m), weight 141 lb 6.4 oz (64.1 kg), SpO2 98 %.    Affect appropriate Elderly female  HEENT: normal Neck supple with no adenopathy JVP normal no bruits no thyromegaly Lungs clear with no wheezing and good diaphragmatic motion Heart:  S1/S2 MR murmur, no rub, gallop or click PMI normal post bilateral mastectomy  Abdomen: benighn, BS positve, no tenderness, no AAA no bruit.  No HSM or HJR Distal pulses intact with no bruits No edema Neuro non-focal Skin warm and dry No muscular weakness   Labs:  No results found for: WBC, HGB, HCT, MCV, PLT No results for input(s): NA, K, CL, CO2, BUN, CREATININE, CALCIUM, PROT, BILITOT, ALKPHOS, ALT, AST, GLUCOSE in the last 168 hours.  Invalid input(s): LABALBU No results found for: CKTOTAL, CKMB, CKMBINDEX, TROPONINI No results found for: CHOL No results found for: HDL No results found for: LDLCALC No results found for: TRIG No results found for: CHOLHDL No results found for: LDLDIRECT    Radiology: No results found.  EKG: SR rate 69 PVC nonspecific ST changes QT 406   ASSESSMENT AND PLAN:   1. PAF:  On chronic eliquis for anticoagulation stable  2. HTN:  Well controlled.  Continue current medications  and low sodium Dash type diet.   3. P-vera:  Continue hydroxeurea labs with primary / hematology  4. Breast Cancer:  History of post bilateral mastectomy  5. MR/TR:  Historically mild/moderate will update echo   Signed: Jenkins Rouge 10/10/2019, 9:58 AM

## 2019-10-10 ENCOUNTER — Ambulatory Visit (INDEPENDENT_AMBULATORY_CARE_PROVIDER_SITE_OTHER): Payer: Medicare Other | Admitting: Cardiovascular Disease

## 2019-10-10 ENCOUNTER — Encounter (INDEPENDENT_AMBULATORY_CARE_PROVIDER_SITE_OTHER): Payer: Self-pay

## 2019-10-10 ENCOUNTER — Encounter: Payer: Self-pay | Admitting: Cardiovascular Disease

## 2019-10-10 ENCOUNTER — Other Ambulatory Visit: Payer: Self-pay

## 2019-10-10 VITALS — BP 140/84 | HR 69 | Ht 68.0 in | Wt 141.4 lb

## 2019-10-10 DIAGNOSIS — I493 Ventricular premature depolarization: Secondary | ICD-10-CM | POA: Diagnosis not present

## 2019-10-10 DIAGNOSIS — I340 Nonrheumatic mitral (valve) insufficiency: Secondary | ICD-10-CM

## 2019-10-10 NOTE — Patient Instructions (Addendum)
Medication Instructions:  *If you need a refill on your cardiac medications before your next appointment, please call your pharmacy*  Lab Work: If you have labs (blood work) drawn today and your tests are completely normal, you will receive your results only by: Marland Kitchen MyChart Message (if you have MyChart) OR . A paper copy in the mail If you have any lab test that is abnormal or we need to change your treatment, we will call you to review the results.  Testing/Procedures: Your physician has requested that you have an echocardiogram. Echocardiography is a painless test that uses sound waves to create images of your heart. It provides your doctor with information about the size and shape of your heart and how well your heart's chambers and valves are working. This procedure takes approximately one hour. There are no restrictions for this procedure.  Follow-Up: At [facility], you and your health needs are our priority.  As part of our continuing mission to provide you with exceptional heart care, we have created designated Provider Care Teams.  These Care Teams include your primary Cardiologist (physician) and Advanced Practice Providers (APPs -  Physician Assistants and Nurse Practitioners) who all work together to provide you with the care you need, when you need it.  Your next appointment:   1 year(s)  The format for your next appointment:   In Person  Provider:   You may see Dr. Johnsie Cancel or one of the following Advanced Practice Providers on your designated Care Team:    Truitt Merle, NP  Cecilie Kicks, NP  Kathyrn Drown, NP

## 2019-10-12 ENCOUNTER — Other Ambulatory Visit: Payer: Self-pay | Admitting: Cardiovascular Disease

## 2019-10-12 MED ORDER — LOSARTAN POTASSIUM 25 MG PO TABS: 25.0000 mg | ORAL_TABLET | ORAL | 3 refills | Status: DC | PRN

## 2019-10-12 MED ORDER — APIXABAN 5 MG PO TABS: 5.0000 mg | ORAL_TABLET | Freq: Two times a day (BID) | ORAL | 9 refills | Status: DC

## 2019-10-12 NOTE — Telephone Encounter (Signed)
Eliquis 5mg  refill request received, pt is 86 years old, weight-64.1kg, Crea-0.79 on 07/16/2019 via [facility]Everywhere, Diagnosis-Afib, and last seen by Dr. Johnsie Cancel on 10/10/2019. Dose is appropriate based on dosing criteria. Will send in refill to requested pharmacy.

## 2019-10-23 ENCOUNTER — Ambulatory Visit (HOSPITAL_COMMUNITY): Payer: Medicare Other | Attending: Cardiovascular Disease

## 2019-10-23 ENCOUNTER — Other Ambulatory Visit: Payer: Self-pay

## 2019-10-23 ENCOUNTER — Encounter (INDEPENDENT_AMBULATORY_CARE_PROVIDER_SITE_OTHER): Payer: Self-pay

## 2019-10-23 DIAGNOSIS — I340 Nonrheumatic mitral (valve) insufficiency: Secondary | ICD-10-CM | POA: Insufficient documentation

## 2019-10-23 DIAGNOSIS — I252 Old myocardial infarction: Secondary | ICD-10-CM | POA: Diagnosis not present

## 2019-10-23 DIAGNOSIS — I10 Essential (primary) hypertension: Secondary | ICD-10-CM | POA: Diagnosis not present

## 2019-10-23 DIAGNOSIS — E119 Type 2 diabetes mellitus without complications: Secondary | ICD-10-CM | POA: Insufficient documentation

## 2019-10-23 DIAGNOSIS — Z9013 Acquired absence of bilateral breasts and nipples: Secondary | ICD-10-CM | POA: Insufficient documentation

## 2019-10-23 DIAGNOSIS — I4891 Unspecified atrial fibrillation: Secondary | ICD-10-CM | POA: Diagnosis not present

## 2019-10-23 DIAGNOSIS — I493 Ventricular premature depolarization: Secondary | ICD-10-CM | POA: Insufficient documentation

## 2019-10-25 ENCOUNTER — Telehealth: Payer: Self-pay | Admitting: Cardiovascular Disease

## 2019-10-25 NOTE — Telephone Encounter (Signed)
Medical records requested from [place]. 10/25/19 vlm

## 2019-12-31 ENCOUNTER — Telehealth: Payer: Self-pay | Admitting: Cardiovascular Disease

## 2019-12-31 NOTE — Telephone Encounter (Signed)
Called patient and her daughter back.  Per Daughter patient has history of Polycythemia and A. FIB. Patient takes losartan 25 mg PRN and is on eliquis 5 mg BID.  Patient's daughter stated patient's BP and HR are fluctuating up and down. Patient has been in [place] at an elevation of 7500 feet for 2 weeks. Patient's SBP is in the 150's in the AM and gets down to 113 in the PM. HR ranges from 57 to 83 and O2 from 90 to 93.  Daughter thought it might be the elevation, but patient stated this is happening back in [place]. Patient has not been taking her losartan and daughter is giving patient a salty snack in the evenings for the low BP. Informed patient's daughter that maybe she can just give patient fluids when BP is low. Patient's daughter wants patient to be seen next week in person. Made patient an appointment on Macarthur Critchley PA schedule on next Monday. Will forward to Dr. Johnsie Cancel for further advisement.

## 2019-12-31 NOTE — Telephone Encounter (Signed)
Not clear what is up most important thing is to make sure she is not in afib again. BP not that low Should have Hb/Hct as well when she is seen with ECG

## 2019-12-31 NOTE — Telephone Encounter (Signed)
   Pt's daughter called, she said her mother is visiting her in [place]. Due to high altitude pt's BP HR and o2 put of control she said she seems like her heart is laboring. She doesn't know what to do if she needs to move her or if there's medication Dr. Johnsie Cancel can prescribe  Please advise

## 2019-12-31 NOTE — Telephone Encounter (Signed)
Called patient and her daughter back. Informed them of Dr. Kyla Balzarine message. Daughter stated she might take her to urgent care to get an ekg. Gave daughter our office fax number so they can send report if they go.

## 2020-01-02 NOTE — Progress Notes (Addendum)
CARDIOLOGY OFFICE NOTE  Date:  01/07/2020    [name] Date of Birth: 1930/02/22 Medical Record #[id number]  PCP:  Emeterio Reeve, DO  Cardiologist:  Johnsie Cancel  Chief Complaint  Patient presents with  . Follow-up    History of Present Illness: [name] is a 86 y.o. female who presents today for a work in visit. Seen for Dr. Johnsie Cancel.   She has a history of HLD, HTN and AF. Other issues include polycythemia - on Hydroxyurea, chronic anticoagulation with Eliquis, prior use of Flecainide, noted valvular heart disease, noted no CAD per prior cath in 2016, prior breast cancer with bilateral mastectomy. Noted labile HTN with anxiety. Has had prior vasovagal syncope in the past - may have been precipitated by hydralazine. She is from [place].   Last seen in February by Dr. Johnsie Cancel - felt to be doing ok. PVCs noted but not aware.   Phone call last week - "Called patient and her daughter back.  Per Daughter patient has history of Polycythemia and A. FIB. Patient takes losartan 25 mg PRN and is on eliquis 5 mg BID.  Patient's daughter stated patient's BP and HR are fluctuating up and down. Patient has been in [place] at an elevation of 7500 feet for 2 weeks. Patient's SBP is in the 150's in the AM and gets down to 113 in the PM. HR ranges from 57 to 83 and O2 from 90 to 93.  Daughter thought it might be the elevation, but patient stated this is happening back in [place]. Patient has not been taking her losartan and daughter is giving patient a salty snack in the evenings for the low BP. Informed patient's daughter that maybe she can just give patient fluids when BP is low. Patient's daughter wants patient to be seen next week in person. Made patient an appointment on Macarthur Critchley PA schedule on next Monday. Will forward to Dr. Johnsie Cancel for further advisemen."  Thus added to my schedule for today.    The patient does not have symptoms concerning for COVID-19 infection (fever, chills, cough, or  new shortness of breath).   Comes in today. Here alone. Her daughter [name] is on the phone. History is a little hard to follow. She has noted significant drops in her BP and pulse - thinks this was happening as far back as to her last visit here. She brings in a log over the last 2 weeks - may low BPs - down in the 44'I systolic - highest in the 160's .  HR variable - some readings in the 40's. Daughter feels like she may need PPM. Has been in [place] in the past 2 weeks with significant altitude. Now back home. She has had what sounds like progressive DOE - she thinks it may be worse. She then notes that she has has had several spells of passing out - while in the shower - does not remember all the details - no injury. This has happened thru the years.  Last spell about 3 weeks ago - while in the shower but multiple spells of reported presyncope. Daughter notes they have found her "shaking and laying on the floor". She has "been hiding her symptoms for a while". She denies chest pain. She has chronic edema.   EKG last week in the ER at [facility] with NSR with PACs - anterior Q's. Labs were fine. Normal kidney function, potassium, H&H, and negative Troponin. Mag level and TSH normal as well.  Past Medical History:  Diagnosis Date  . Actinic keratoses 07/24/2018  . Adhesive capsulitis of shoulder 12/06/2013  . Arthritis   . Atrial fibrillation ([place]) 07/17/2018  . Benign essential HTN 07/24/2018   Last Assessment & Plan:  She is doing better with adjustments in her blood pressure medication and will continue this monitoring. Last Assessment & Plan:  She is doing better with adjustments in her blood pressure medication and will continue this monitoring.  . Brittle nails 03/06/2019  . Cancer ([place])    Breast  . Complete rupture of rotator cuff 11/29/2013  . Complication of anesthesia   . Dysrhythmia    A-Fib  . GERD (gastroesophageal reflux disease)   . 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 History of  breast cancer 07/11/2012   Bilateral mastectomies with reconstruction.  Marland Kitchen Hx of basal cell carcinoma excision 07/24/2018  . 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 Hx of squamous cell carcinoma of skin 07/24/2018  . Hyperlipidemia 07/24/2018  . Lentigines 03/06/2019  . Myocardial infarction ([place])    Silent Heart Attack  . Past myocardial infarction 03/06/2019  . Polycythemia   . Polycythemia vera ([place]) 07/17/2018   Last Assessment & Plan:  The patient's blood counts remain in the desired range on her current dose of Hydrea.  She will continue this medication without dose modification.  She is moving to [facility] next week and we will help her arrange for necessary follow-up there.  She will return here on an as-needed basis.  . Ptosis of eyelid 12/05/2009  . Seborrheic keratosis 03/06/2019  . 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 ABDOMINAL HYSTERECTOMY  2005  . ACHILLES TENDON SURGERY Right    2008  . ANKLE FRACTURE SURGERY Left 2000  . Breast Recontstruction Right    2004  . COLONOSCOPY W/ POLYPECTOMY    . EYE SURGERY Bilateral    Cataract  . FASCIOTOMY  2001  . FEMUR SURGERY  2018   Crushed Femur,   . HAMMER TOE SURGERY Bilateral 2014  . HERNIA REPAIR  2012   abdominal  . LAMINECTOMY  1994   L3-L4, L4-L5  (1987 C2-C3, C3-C4)  . MASTECTOMY Bilateral    1997-R, L -2004  . MENISCUS REPAIR Right 2007   arthroscopy  . SHOULDER OPEN ROTATOR CUFF REPAIR Right 2015  . SKIN CANCER EXCISION     basal, squamous, melonoma  . TONSILLECTOMY       Medications: Current Meds  Medication Sig  . apixaban (ELIQUIS) 5 MG TABS tablet Take 1 tablet (5 mg total) by mouth 2 (two) times daily.  . Ascorbic Acid (VITAMIN C PO) Take 3 tablets by mouth daily.  . Cholecalciferol (VITAMIN D3) 50 MCG (2000 UT) TABS Take 2,000 Units by mouth daily.  . Coenzyme Q10 (CO Q-10) 50 MG CAPS Take 50 mg by mouth daily.   . hydroxyurea (HYDREA) 500 MG capsule  Take 500 mg by mouth See admin instructions. Take 500 mg by mouth twice daily Monday-Friday and once daily on Saturday and Sunday  . MAGNESIUM OXIDE 400 PO Take 400 mg by mouth daily.   . Melatonin 10 MG TABS Take 10 mg by mouth at bedtime.   . Multiple Vitamins-Minerals (MULTIVITAMIN ADULT PO) Take 1 tablet by mouth daily.   . Omega-3 Fatty Acids (OMEGA-3 EPA FISH OIL PO) Take 1 capsule by mouth 2 (two) times daily. EPA 300 mg DHA 200 mg  . OVER THE COUNTER  MEDICATION Take 300 mg by mouth daily. Green tea   . [DISCONTINUED] losartan (COZAAR) 25 MG tablet Take 1 tablet (25 mg total) by mouth as needed.     Allergies: Allergies  Allergen Reactions  . Bee Venom Anaphylaxis  . Sulfa Antibiotics Anaphylaxis  . Sulfamethoxazole Anaphylaxis  . Hydrocodone-Acetaminophen Nausea Only  . Meperidine Nausea And Vomiting  . Morphine Nausea And Vomiting  . Oxycodone Hcl Nausea Only  . Oxycodone-Acetaminophen Nausea Only  . Anesthesia S-I-40 [Propofol] Nausea Only  . Hydrocodone Nausea And Vomiting  . Lactose Nausea And Vomiting  . Oxycodone-Aspirin Nausea And Vomiting  . Propoxyphene   . Soy Allergy Other (See Comments)  . Codeine Nausea And Vomiting  . Latex Rash  . Valdecoxib Nausea Only    Social History: The patient  reports that she has never smoked. She has never used smokeless tobacco. She reports previous alcohol use. She reports that she does not use drugs.   Family History: The patient's family history includes Heart attack in her father.   Review of Systems: Please see the history of present illness.   All other systems are reviewed and negative.   Physical Exam: VS:  BP 138/70   Pulse 76   Ht 5\' 8"  (1.727 m)   Wt 142 lb (64.4 kg)   SpO2 97%   BMI 21.59 kg/m  .  BMI Body mass index is 21.59 kg/m.  Wt Readings from Last 3 Encounters:  01/07/20 142 lb (64.4 kg)  10/10/19 141 lb 6.4 oz (64.1 kg)  08/29/19 139 lb (63 kg)    General: Elderly. Little anxious but alert  alert and in no acute distress.   HEENT: Normal.  Neck: Supple, no JVD, carotid bruits, or masses noted.  Cardiac: Regular rate and rhythm. No murmurs, rubs, or gallops. Legs are full with 1+ chronic edema noted.  Respiratory:  Lungs are clear to auscultation bilaterally with normal work of breathing.  GI: Soft and nontender.  MS: No deformity or atrophy. Gait and ROM intact.  Skin: Warm and dry. Color is normal.  Neuro:  Strength and sensation are intact and no gross focal deficits noted.  Psych: Alert, appropriate and with normal affect.   LABORATORY DATA:  EKG:  EKG is not ordered today.  She brings her EKG from May 15 from the ER - this shows sinus rhythm with PACs noted.   Her labs from last week included a normal H&H. Normal potassium and kidney function. Negative troponin.    BNP (last 3 results) No results for input(s): BNP in the last 8760 hours.  ProBNP (last 3 results) No results for input(s): PROBNP in the last 8760 hours.   Other Studies Reviewed Today:  ECHO IMPRESSIONS 10/2019   1. Left ventricular ejection fraction, by estimation, is 60 to 65%. The  left ventricle has normal function. The left ventricle has no regional  wall motion abnormalities. Left ventricular diastolic parameters are  consistent with Grade I diastolic  dysfunction (impaired relaxation).  2. Right ventricular systolic function is normal. The right ventricular  size is normal. There is mildly elevated pulmonary artery systolic  pressure.  3. Left atrial size was moderately dilated.  4. The mitral valve is abnormal. Mild mitral valve regurgitation.  5. The aortic valve is tricuspid. Aortic valve regurgitation is not  visualized.  6. The inferior vena cava is dilated in size with <50% respiratory  variability, suggesting right atrial pressure of 15 mmHg.     Assessment/Plan:  1.  Labile BP and HR - presyncope - ?syncope - will check orthostatics today - needs 14 day Zio - she is  taking her ARB "prn" - will stop this for now.   Orthostatics showed lying BP of 123/68 with HR 62; sitting BP 119/70 with HR 68; standing BP of 125/72 with HR 70 and standing at 3 minutes is BP 130/76 with HR 76.   2. PAF - in sinus by exam and EKG from last week. See #1.   3. Valvular heart disease - her echo was updated in March - this just showed mild MR noted.   4. Polycythemia Vera - on therapy.   5. HTN - see above. Stopping the ARB for now - she has used this "prn". She has not had any worrisome BP readings that were too high. She will continue to monitor.   6. Chronic anticoagulation - recent labs noted. Remains on Eliquis.   7. COVID-19 Education: The signs and symptoms of COVID-19 were discussed with the patient and how to seek care for testing (follow up with PCP or arrange E-visit).  The importance of social distancing, staying at home, hand hygiene and wearing a mask when out in public were discussed today.  Current medicines are reviewed with the patient today.  The patient does not have concerns regarding medicines other than what has been noted above.  The following changes have been made:  See above.  Labs/ tests ordered today include:    Orders Placed This Encounter  Procedures  . LONG TERM MONITOR (3-14 DAYS)     Disposition:   FU with Dr. Johnsie Cancel after her monitor.     Patient is agreeable to this plan and will call if any problems develop in the interim.   SignedTruitt Merle, NP  01/07/2020 10:30 AM  [facility] [address] [place], [place]  [address] Phone: [phone] Fax: [phone]

## 2020-01-05 LAB — PROTIME-INR

## 2020-01-07 ENCOUNTER — Other Ambulatory Visit: Payer: Self-pay

## 2020-01-07 ENCOUNTER — Encounter: Payer: Self-pay | Admitting: Nurse Practitioner

## 2020-01-07 ENCOUNTER — Encounter: Payer: Self-pay | Admitting: *Deleted

## 2020-01-07 ENCOUNTER — Ambulatory Visit (INDEPENDENT_AMBULATORY_CARE_PROVIDER_SITE_OTHER): Payer: Medicare Other | Admitting: Nurse Practitioner

## 2020-01-07 VITALS — BP 138/70 | HR 76 | Ht 68.0 in | Wt 142.0 lb

## 2020-01-07 DIAGNOSIS — I340 Nonrheumatic mitral (valve) insufficiency: Secondary | ICD-10-CM

## 2020-01-07 DIAGNOSIS — I480 Paroxysmal atrial fibrillation: Secondary | ICD-10-CM

## 2020-01-07 DIAGNOSIS — I493 Ventricular premature depolarization: Secondary | ICD-10-CM | POA: Diagnosis not present

## 2020-01-07 DIAGNOSIS — R55 Syncope and collapse: Secondary | ICD-10-CM | POA: Diagnosis not present

## 2020-01-07 DIAGNOSIS — I950 Idiopathic hypotension: Secondary | ICD-10-CM

## 2020-01-07 DIAGNOSIS — Z79899 Other long term (current) drug therapy: Secondary | ICD-10-CM

## 2020-01-07 NOTE — Progress Notes (Signed)
Patient ID: [name], female   DOB: [birth year], 86 y.o.   MRN: [id number] 14 Day ZIO XT shipped to patients home.

## 2020-01-07 NOTE — Patient Instructions (Addendum)
After Visit Summary:  We will be checking the following labs today - NONE   Medication Instructions:    Continue with your current medicines. BUT   Lets stay off the Losartan for now   If you need a refill on your cardiac medications before your next appointment, please call your pharmacy.     Testing/Procedures To Be Arranged:  14 day Zio  Follow-Up:   See Dr. Johnsie Cancel for discussion of your monitor.     At [facility], you and your health needs are our priority.  As part of our continuing mission to provide you with exceptional heart care, we have created designated Provider Care Teams.  These Care Teams include your primary Cardiologist (physician) and Advanced Practice Providers (APPs -  Physician Assistants and Nurse Practitioners) who all work together to provide you with the care you need, when you need it.  Special Instructions:  . Stay safe, stay home, wash your hands for at least 20 seconds and wear a mask when out in public.  . It was good to talk with you today.  ZIO XT- Long Term Monitor Instructions   Your physician has requested you wear your ZIO patch monitor___14____days.   This is a single patch monitor.  [employer] supplies one patch monitor per enrollment.  Additional stickers are not available.   Please do not apply patch if you will be having a Nuclear Stress Test, Echocardiogram, Cardiac CT, MRI, or Chest Xray during the time frame you would be wearing the monitor. The patch cannot be worn during these tests.  You cannot remove and re-apply the ZIO XT patch monitor.   Your ZIO patch monitor will be sent USPS Priority mail from [facility] directly to your home address. The monitor may also be mailed to a PO BOX if home delivery is not available.   It may take 3-5 days to receive your monitor after you have been enrolled.   Once you have received you monitor, please review enclosed instructions.  Your monitor has already been registered assigning  a specific monitor serial # to you.   Applying the monitor   Shave hair from upper left chest.   Hold abrader disc by orange tab.  Rub abrader in 40 strokes over left upper chest as indicated in your monitor instructions.   Clean area with 4 enclosed alcohol pads .  Use all pads to assure are is cleaned thoroughly.  Let dry.   Apply patch as indicated in monitor instructions.  Patch will be place under collarbone on left side of chest with arrow pointing upward.   Rub patch adhesive wings for 2 minutes.Remove white label marked "1".  Remove white label marked "2".  Rub patch adhesive wings for 2 additional minutes.   While looking in a mirror, press and release button in center of patch.  A small green light will flash 3-4 times .  This will be your only indicator the monitor has been turned on.     Do not shower for the first 24 hours.  You may shower after the first 24 hours.   Press button if you feel a symptom. You will hear a small click.  Record Date, Time and Symptom in the Patient Log Book.   When you are ready to remove patch, follow instructions on last 2 pages of Patient Log Book.  Stick patch monitor onto last page of Patient Log Book.   Place Patient Log Book in [place] box.  Use locking  tab on box and tape box closed securely.  The [employer] and [employer] has [employer] on it.  Please place in mailbox as soon as possible.  Your physician should have your test results approximately 7 days after the monitor has been mailed back to [facility].   Call [place] at [phone] if you have questions regarding your ZIO XT patch monitor.  Call them immediately if you see an orange light blinking on your monitor.   If your monitor falls off in less than 4 days contact our Monitor department at [phone].  If your monitor becomes loose or falls off after 4 days call [employer] at [phone] for suggestions on securing your monitor.    Call the [place] office at [phone] if you have any questions, problems or concerns.

## 2020-01-10 ENCOUNTER — Telehealth: Payer: Self-pay | Admitting: Nurse Practitioner

## 2020-01-10 NOTE — Telephone Encounter (Signed)
   Pt said she received a zio patch and she said she's allergic to the adhesive. She would like to speak with Cecille Rubin gerhardt about it

## 2020-01-10 NOTE — Telephone Encounter (Signed)
Patient has not applied monitor yet. Explained to patient the ZIO patch is considered hypoallergenic.  There is a small chance she could still have reaction, but the patch uses a gel, not an adhesive to stick. Explained instructions to apply monitor. We will attempt to obtain as much data as possible.   Instructed patient, if she does have a reaction, to remove patch and mail it back to [place], and call our office.

## 2020-01-10 NOTE — Telephone Encounter (Signed)
Patient scheduled to come into office 01/15/2020 at 1:00PM to have monitor applied.

## 2020-01-10 NOTE — Telephone Encounter (Signed)
Patient is calling to follow up in regards to ZIO patch. She states she is having trouble applying it. Patient is requesting to speak with [facility]. Please call.

## 2020-01-15 ENCOUNTER — Ambulatory Visit (INDEPENDENT_AMBULATORY_CARE_PROVIDER_SITE_OTHER): Payer: Medicare Other

## 2020-01-15 ENCOUNTER — Other Ambulatory Visit: Payer: Self-pay

## 2020-01-15 DIAGNOSIS — R55 Syncope and collapse: Secondary | ICD-10-CM | POA: Diagnosis not present

## 2020-02-12 NOTE — Progress Notes (Signed)
CARDIOLOGY OFFICE NOTE  Date:  02/14/2020    [name] Date of Birth: March 20, 1930 Medical Record #[id number]  PCP:  Emeterio Reeve, DO  Cardiologist:  Johnsie Cancel  No chief complaint on file.   History of Present Illness: [name] is a 86 y.o. female  With a history of HLD, HTN and AF. Other issues include polycythemia - on Hydroxyurea, chronic anticoagulation with Eliquis, prior use of Flecainide,  Mild MR ,  no CAD per prior cath in 2016, prior breast cancer with bilateral mastectomy. Noted labile HTN with anxiety. Has had prior vasovagal syncope in the past - may have been precipitated by hydralazine. She is from [place].   Traveled to [place] in May at altitude and daughter complained that her BP and HR fluctuating Seen by NP 01/07/20 and ARB stopped ). Daughter [name] concerned about pre syncope in shower pulse in 27'X and some systolic BP's in 70 mmhg systolic. Was not postural during NP visit 01/07/20 Labs in [facility] including TSH , Hct, K, Troponin normal ECG with SR PAC no significant arrhythmia , heart block or PAF.   Echo 10/23/19 normal EF just mild MR  Monitor 02/12/20 SR average Hr 68 5% PAF longest 15 hours   She is widowed for 5 years She has a daughter in [place] and one in [place]  Had interview today with daughter from [place] on phone she works in Adult nurse at [employer] Discussed PAF and Rx strategies. I think a lot of her symptoms are from this as well as her labile BP  She does not want COVID vaccine Long discussion about this   Past Medical History:  Diagnosis Date  . Actinic keratoses 07/24/2018  . Adhesive capsulitis of shoulder 12/06/2013  . Arthritis   . Atrial fibrillation ([place]) 07/17/2018  . Benign essential HTN 07/24/2018   Last Assessment & Plan:  She is doing better with adjustments in her blood pressure medication and will continue this monitoring. Last Assessment & Plan:  She is doing better with adjustments in her blood  pressure medication and will continue this monitoring.  . Brittle nails 03/06/2019  . Cancer ([place])    Breast  . Complete rupture of rotator cuff 11/29/2013  . Complication of anesthesia   . Dysrhythmia    A-Fib  . GERD (gastroesophageal reflux disease)   . 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 History of breast cancer 07/11/2012   Bilateral mastectomies with reconstruction.  Marland Kitchen Hx of basal cell carcinoma excision 07/24/2018  . 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 Hx of squamous cell carcinoma of skin 07/24/2018  . Hyperlipidemia 07/24/2018  . Lentigines 03/06/2019  . Myocardial infarction ([place])    Silent Heart Attack  . Past myocardial infarction 03/06/2019  . Polycythemia   . Polycythemia vera ([place]) 07/17/2018   Last Assessment & Plan:  The patient's blood counts remain in the desired range on her current dose of Hydrea.  She will continue this medication without dose modification.  She is moving to [facility] next week and we will help her arrange for necessary follow-up there.  She will return here on an as-needed basis.  . Ptosis of eyelid 12/05/2009  . Seborrheic keratosis 03/06/2019  . 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 ABDOMINAL HYSTERECTOMY  2005  . ACHILLES TENDON SURGERY Right    2008  . ANKLE FRACTURE SURGERY Left 2000  . Breast Recontstruction Right  2004  . COLONOSCOPY W/ POLYPECTOMY    . EYE SURGERY Bilateral    Cataract  . FASCIOTOMY  2001  . FEMUR SURGERY  2018   Crushed Femur,   . HAMMER TOE SURGERY Bilateral 2014  . HERNIA REPAIR  2012   abdominal  . LAMINECTOMY  1994   L3-L4, L4-L5  (1987 C2-C3, C3-C4)  . MASTECTOMY Bilateral    1997-R, L -2004  . MENISCUS REPAIR Right 2007   arthroscopy  . SHOULDER OPEN ROTATOR CUFF REPAIR Right 2015  . SKIN CANCER EXCISION     basal, squamous, melonoma  . TONSILLECTOMY       Medications: Current Meds  Medication Sig  . apixaban (ELIQUIS) 5 MG TABS tablet Take 1  tablet (5 mg total) by mouth 2 (two) times daily.  . Ascorbic Acid (VITAMIN C PO) Take 3 tablets by mouth daily.  . Cholecalciferol (VITAMIN D3) 50 MCG (2000 UT) TABS Take 2,000 Units by mouth daily.  . Coenzyme Q10 (CO Q-10) 50 MG CAPS Take 50 mg by mouth daily.   . hydroxyurea (HYDREA) 500 MG capsule Take 500 mg by mouth See admin instructions. Take 500 mg by mouth twice daily Monday-Friday and once daily on Saturday and Sunday  . MAGNESIUM OXIDE 400 PO Take 400 mg by mouth daily.   . Melatonin 10 MG TABS Take 10 mg by mouth at bedtime.   . Multiple Vitamins-Minerals (MULTIVITAMIN ADULT PO) Take 1 tablet by mouth daily.   . Omega-3 Fatty Acids (OMEGA-3 EPA FISH OIL PO) Take 1 capsule by mouth 2 (two) times daily. EPA 300 mg DHA 200 mg  . OVER THE COUNTER MEDICATION Take 300 mg by mouth daily. Green tea      Allergies: Allergies  Allergen Reactions  . Bee Venom Anaphylaxis  . Sulfa Antibiotics Anaphylaxis  . Sulfamethoxazole Anaphylaxis  . Hydrocodone-Acetaminophen Nausea Only  . Meperidine Nausea And Vomiting  . Morphine Nausea And Vomiting  . Oxycodone Hcl Nausea Only  . Oxycodone-Acetaminophen Nausea Only  . Anesthesia S-I-40 [Propofol] Nausea Only  . Hydrocodone Nausea And Vomiting  . Lactose Nausea And Vomiting  . Oxycodone-Aspirin Nausea And Vomiting  . Propoxyphene   . Soy Allergy Other (See Comments)  . Codeine Nausea And Vomiting  . Latex Rash  . Valdecoxib Nausea Only    Social History: The patient  reports that she has never smoked. She has never used smokeless tobacco. She reports previous alcohol use. She reports that she does not use drugs.   Family History: The patient's family history includes Heart attack in her father.   Review of Systems: Please see the history of present illness.   All other systems are reviewed and negative.   Physical Exam: VS:  BP (!) 146/72   Pulse 63   Ht 5\' 8"  (1.727 m)   Wt 141 lb (64 kg)   SpO2 97%   BMI 21.44 kg/m  .   BMI Body mass index is 21.44 kg/m.  Wt Readings from Last 3 Encounters:  02/14/20 141 lb (64 kg)  01/07/20 142 lb (64.4 kg)  10/10/19 141 lb 6.4 oz (64.1 kg)    Affect appropriate Healthy:  appears stated age 35: normal Neck supple with no adenopathy JVP normal no bruits no thyromegaly Lungs clear with no wheezing and good diaphragmatic motion Heart:  S1/S2 no murmur, no rub, gallop or click PMI normal  Post bilateral mastectomy Abdomen: benighn, BS positve, no tenderness, no AAA no bruit.  No HSM or  HJR Distal pulses intact with no bruits No edema Neuro non-focal Skin warm and dry No muscular weakness    LABORATORY DATA:  EKG:  01/05/20  from the ER - this shows sinus rhythm with PACs noted.   Labs:  01/05/20  normal H&H. Normal potassium and kidney function. Negative troponin.    Other Studies Reviewed Today:  ECHO IMPRESSIONS 10/2019   1. Left ventricular ejection fraction, by estimation, is 60 to 65%. The  left ventricle has normal function. The left ventricle has no regional  wall motion abnormalities. Left ventricular diastolic parameters are  consistent with Grade I diastolic  dysfunction (impaired relaxation).  2. Right ventricular systolic function is normal. The right ventricular  size is normal. There is mildly elevated pulmonary artery systolic  pressure.  3. Left atrial size was moderately dilated.  4. The mitral valve is abnormal. Mild mitral valve regurgitation.  5. The aortic valve is tricuspid. Aortic valve regurgitation is not  visualized.  6. The inferior vena cava is dilated in size with <50% respiratory  variability, suggesting right atrial pressure of 15 mmHg.     Assessment/Plan:  1. Labile BP and HR - presyncope - not orthostatic ARB held see below  2. PAF - more episodes by monitor  On eliquis start flecainide and low dose cardizem at night  F/U ETT 2 weeks r/o QRS widening and pro arrhythmia. F/U with EP and Afib clinic   3.  Valvular heart disease - TTE March just mild MR   4. Polycythemia Vera - continue hydroxyurea f/u hematology     Current medicines are reviewed with the patient today.  The patient does not have concerns regarding medicines other than what has been noted above.  The following changes have been made:  See above.  Labs/ tests ordered today include:    No orders of the defined types were placed in this encounter.    Disposition:   Refer to EP/Afib clinic  Start cardizem 120 mg daily and flecainide 50 mg bid    Patient is agreeable to this plan and will call if any problems develop in the interim.   Signed: Jenkins Rouge, MD  02/14/2020 10:57 AM  [facility] [address] [place], [place]  [address] Phone: [phone] Fax: [phone]

## 2020-02-14 ENCOUNTER — Ambulatory Visit (INDEPENDENT_AMBULATORY_CARE_PROVIDER_SITE_OTHER): Payer: Medicare Other | Admitting: Cardiovascular Disease

## 2020-02-14 ENCOUNTER — Encounter: Payer: Self-pay | Admitting: Cardiovascular Disease

## 2020-02-14 ENCOUNTER — Telehealth: Payer: Self-pay | Admitting: Internal Medicine

## 2020-02-14 ENCOUNTER — Other Ambulatory Visit: Payer: Self-pay

## 2020-02-14 VITALS — BP 146/72 | HR 63 | Ht 68.0 in | Wt 141.0 lb

## 2020-02-14 DIAGNOSIS — I4891 Unspecified atrial fibrillation: Secondary | ICD-10-CM

## 2020-02-14 DIAGNOSIS — Z79899 Other long term (current) drug therapy: Secondary | ICD-10-CM

## 2020-02-14 MED ORDER — DILTIAZEM HCL ER COATED BEADS 120 MG PO CP24: 120.0000 mg | ORAL_CAPSULE | Freq: Every day | ORAL | 3 refills | Status: DC

## 2020-02-14 MED ORDER — FLECAINIDE ACETATE 50 MG PO TABS: 50.0000 mg | ORAL_TABLET | Freq: Two times a day (BID) | ORAL | 3 refills | Status: DC

## 2020-02-14 NOTE — Telephone Encounter (Signed)
New message  Patient returning a call for an appt with Dr. Rayann Heman. Transferred call to [employer]

## 2020-02-14 NOTE — Patient Instructions (Addendum)
Medication Instructions:  Your physician has recommended you make the following change in your medication:  1-START Fleciniade 50 mg by mouth twice daily 2-START Cardiazem 120 mg by mouth daily at night.  *If you need a refill on your cardiac medications before your next appointment, please call your pharmacy*  Lab Work: If you have labs (blood work) drawn today and your tests are completely normal, you will receive your results only by: Marland Kitchen MyChart Message (if you have MyChart) OR . A paper copy in the mail If you have any lab test that is abnormal or we need to change your treatment, we will call you to review the results.  Testing/Procedures: Your physician has requested that you have an exercise tolerance test in 2 weeks. For further information please visit [email or web address]. Please also follow instruction sheet, as given.  Follow-Up:  You have been referred to Dr. Rayann Heman for Atrial Fib or the [place] [facility], you and your health needs are our priority.  As part of our continuing mission to provide you with exceptional heart care, we have created designated Provider Care Teams.  These Care Teams include your primary Cardiologist (physician) and Advanced Practice Providers (APPs -  Physician Assistants and Nurse Practitioners) who all work together to provide you with the care you need, when you need it.  We recommend signing up for the patient portal called "MyChart".  Sign up information is provided on this After Visit Summary.  MyChart is used to connect with patients for Virtual Visits (Telemedicine).  Patients are able to view lab/test results, encounter notes, upcoming appointments, etc.  Non-urgent messages can be sent to your provider as well.   To learn more about what you can do with MyChart, go to [email or web address].    Your next appointment:   6 month(s)  The format for your next appointment:   In Person  Provider:   You may see Dr. Johnsie Cancel or  one of the following Advanced Practice Providers on your designated Care Team:    Truitt Merle, NP  Cecilie Kicks, NP  Kathyrn Drown, NP

## 2020-02-21 ENCOUNTER — Telehealth (HOSPITAL_COMMUNITY): Payer: Self-pay

## 2020-02-21 NOTE — Telephone Encounter (Signed)
Encounter complete. 

## 2020-02-22 ENCOUNTER — Telehealth (HOSPITAL_COMMUNITY): Payer: Self-pay

## 2020-02-22 NOTE — Telephone Encounter (Signed)
Encounter complete. 

## 2020-02-26 ENCOUNTER — Ambulatory Visit (HOSPITAL_COMMUNITY)
Admission: RE | Admit: 2020-02-26 | Discharge: 2020-02-26 | Disposition: A | Discharge status: Home / Self Care | Payer: Medicare Other | Source: Ambulatory Visit | Attending: Internal Medicine | Admitting: Internal Medicine

## 2020-02-26 ENCOUNTER — Other Ambulatory Visit: Payer: Self-pay

## 2020-02-26 DIAGNOSIS — I4891 Unspecified atrial fibrillation: Secondary | ICD-10-CM

## 2020-02-26 DIAGNOSIS — Z79899 Other long term (current) drug therapy: Secondary | ICD-10-CM

## 2020-02-28 ENCOUNTER — Telehealth: Payer: Self-pay | Admitting: Cardiovascular Disease

## 2020-02-28 LAB — EXERCISE TOLERANCE TEST
Estimated workload: 4.6 METS
Exercise duration (min): 3 min
Exercise duration (sec): 28 s
MPHR: 133 {beats}/min
Peak HR: 120 {beats}/min
Percent HR: 90 %
Rest HR: 62 {beats}/min

## 2020-02-28 NOTE — Telephone Encounter (Signed)
Informed patient of ETT results.

## 2020-02-28 NOTE — Telephone Encounter (Signed)
    Pt is returning call from [facility] about ETT results. She said will be out today and to call her back tomorrow morning.

## 2020-03-25 ENCOUNTER — Telehealth: Payer: Self-pay

## 2020-03-25 ENCOUNTER — Other Ambulatory Visit: Payer: Self-pay

## 2020-03-25 ENCOUNTER — Ambulatory Visit (HOSPITAL_COMMUNITY)
Admission: RE | Admit: 2020-03-25 | Discharge: 2020-03-25 | Disposition: A | Discharge status: Home / Self Care | Payer: Medicare Other | Source: Ambulatory Visit | Attending: Physician Assistant | Admitting: Physician Assistant

## 2020-03-25 VITALS — BP 140/66 | HR 56 | Ht 68.0 in | Wt 139.6 lb

## 2020-03-25 DIAGNOSIS — Z7901 Long term (current) use of anticoagulants: Secondary | ICD-10-CM | POA: Diagnosis not present

## 2020-03-25 DIAGNOSIS — I480 Paroxysmal atrial fibrillation: Secondary | ICD-10-CM | POA: Diagnosis present

## 2020-03-25 DIAGNOSIS — E785 Hyperlipidemia, unspecified: Secondary | ICD-10-CM | POA: Diagnosis not present

## 2020-03-25 DIAGNOSIS — D6869 Other thrombophilia: Secondary | ICD-10-CM | POA: Insufficient documentation

## 2020-03-25 DIAGNOSIS — I252 Old myocardial infarction: Secondary | ICD-10-CM | POA: Diagnosis not present

## 2020-03-25 DIAGNOSIS — I10 Essential (primary) hypertension: Secondary | ICD-10-CM | POA: Insufficient documentation

## 2020-03-25 DIAGNOSIS — Z79899 Other long term (current) drug therapy: Secondary | ICD-10-CM | POA: Diagnosis not present

## 2020-03-25 NOTE — Progress Notes (Signed)
Primary Care Physician: Emeterio Reeve, DO Primary Cardiologist: Dr Johnsie Cancel Primary Electrophysiologist: Dr Rayann Heman Referring Physician: Dr Johnsie Cancel   [name] is a 86 y.o. female with a history of HLD, HTN, breast cancer, mild MR, and paroxysmal atrial fibrillation who presents for consultation in the [facility].  The patient was initially diagnosed with atrial fibrillation remotely and had previously been on flecainide. Patient was seen on 01/07/20 for symptoms of an elevated heart rate, presyncope, and labile BP. She wore a Zio patch which showed 5% afib burden and she was restarted on flecainide. Patient is on Eliquis for a CHADS2VASC score of 4. Patient reports she has done well since starting flecainide with no further presyncope. She denies bleeding issues on anticoagulation.   Today, she denies symptoms of palpitations, chest pain, shortness of breath, orthopnea, PND, lower extremity edema, dizziness, presyncope, syncope, snoring, daytime somnolence, bleeding, or neurologic sequela. The patient is tolerating medications without difficulties and is otherwise without complaint today.    Atrial Fibrillation Risk Factors:  she does not have symptoms or diagnosis of sleep apnea. she does not have a history of rheumatic fever.   she has a BMI of Body mass index is 21.23 kg/m.Marland Kitchen Filed Weights   03/25/20 1103  Weight: 63.3 kg    Family History  Problem Relation Age of Onset  . Heart attack Father      Atrial Fibrillation Management history:  Previous antiarrhythmic drugs: flecainide Previous cardioversions: none Previous ablations: none CHADS2VASC score: 4 Anticoagulation history: Eliquis   Past Medical History:  Diagnosis Date  . Actinic keratoses 07/24/2018  . Adhesive capsulitis of shoulder 12/06/2013  . Arthritis   . Atrial fibrillation ([place]) 07/17/2018  . Benign essential HTN 07/24/2018   Last Assessment & Plan:  She is doing  better with adjustments in her blood pressure medication and will continue this monitoring. Last Assessment & Plan:  She is doing better with adjustments in her blood pressure medication and will continue this monitoring.  . Brittle nails 03/06/2019  . Cancer ([place])    Breast  . Complete rupture of rotator cuff 11/29/2013  . Complication of anesthesia   . Dysrhythmia    A-Fib  . GERD (gastroesophageal reflux disease)   . 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 History of breast cancer 07/11/2012   Bilateral mastectomies with reconstruction.  Marland Kitchen Hx of basal cell carcinoma excision 07/24/2018  . 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 Hx of squamous cell carcinoma of skin 07/24/2018  . Hyperlipidemia 07/24/2018  . Lentigines 03/06/2019  . Myocardial infarction ([place])    Silent Heart Attack  . Past myocardial infarction 03/06/2019  . Polycythemia   . Polycythemia vera ([place]) 07/17/2018   Last Assessment & Plan:  The patient's blood counts remain in the desired range on her current dose of Hydrea.  She will continue this medication without dose modification.  She is moving to [facility] next week and we will help her arrange for necessary follow-up there.  She will return here on an as-needed basis.  . Ptosis of eyelid 12/05/2009  . Seborrheic keratosis 03/06/2019  . 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 ABDOMINAL HYSTERECTOMY  2005  . ACHILLES TENDON SURGERY Right    2008  . ANKLE FRACTURE SURGERY Left 2000  . Breast Recontstruction Right    2004  . COLONOSCOPY W/ POLYPECTOMY    . EYE SURGERY Bilateral    Cataract  .  FASCIOTOMY  2001  . FEMUR SURGERY  2018   Crushed Femur,   . HAMMER TOE SURGERY Bilateral 2014  . HERNIA REPAIR  2012   abdominal  . LAMINECTOMY  1994   L3-L4, L4-L5  (1987 C2-C3, C3-C4)  . MASTECTOMY Bilateral    1997-R, L -2004  . MENISCUS REPAIR Right 2007   arthroscopy  . SHOULDER OPEN ROTATOR CUFF REPAIR Right 2015  . SKIN CANCER  EXCISION     basal, squamous, melonoma  . TONSILLECTOMY      Current Outpatient Medications  Medication Sig Dispense Refill  . apixaban (ELIQUIS) 5 MG TABS tablet Take 1 tablet (5 mg total) by mouth 2 (two) times daily. 60 tablet 9  . Ascorbic Acid (VITAMIN C PO) Take 3 tablets by mouth daily.    . Cholecalciferol (VITAMIN D3) 50 MCG (2000 UT) TABS Take 2,000 Units by mouth daily.    . Coenzyme Q10 (CO Q-10) 50 MG CAPS Take 50 mg by mouth daily.     Marland Kitchen diltiazem (CARDIZEM CD) 120 MG 24 hr capsule Take 1 capsule (120 mg total) by mouth daily. 90 each 3  . flecainide (TAMBOCOR) 50 MG tablet Take 1 tablet (50 mg total) by mouth 2 (two) times daily. 180 tablet 3  . hydroxyurea (HYDREA) 500 MG capsule Take 500 mg by mouth See admin instructions. Take 500 mg by mouth twice daily Monday-Friday and once daily on Saturday and Sunday    . MAGNESIUM OXIDE 400 PO Take 400 mg by mouth daily.     . Melatonin 10 MG TABS Take 10 mg by mouth at bedtime.     . Multiple Vitamins-Minerals (MULTIVITAMIN ADULT PO) Take 1 tablet by mouth daily.     . Omega-3 Fatty Acids (OMEGA-3 EPA FISH OIL PO) Take 1 capsule by mouth daily. EPA 300 mg DHA 200 mg    . OVER THE COUNTER MEDICATION Take 300 mg by mouth daily. Green tea      No current facility-administered medications for this encounter.    Allergies  Allergen Reactions  . Bee Venom Anaphylaxis  . Sulfa Antibiotics Anaphylaxis  . Sulfamethoxazole Anaphylaxis  . Hydrocodone-Acetaminophen Nausea Only  . Meperidine Nausea And Vomiting  . Morphine Nausea And Vomiting  . Oxycodone Hcl Nausea Only  . Oxycodone-Acetaminophen Nausea Only  . Anesthesia S-I-40 [Propofol] Nausea Only  . Hydrocodone Nausea And Vomiting  . Lactose Nausea And Vomiting  . Oxycodone-Aspirin Nausea And Vomiting  . Propoxyphene   . Soy Allergy Other (See Comments)  . Codeine Nausea And Vomiting  . Latex Rash  . Valdecoxib Nausea Only    Social History   Socioeconomic History  .  Marital status: Widowed    Spouse name: Not on file  . Number of children: Not on file  . Years of education: Not on file  . Highest education level: Not on file  Occupational History  . Not on file  Tobacco Use  . Smoking status: Never Smoker  . Smokeless tobacco: Never Used  Vaping Use  . Vaping Use: Never used  Substance and Sexual Activity  . Alcohol use: Not Currently  . Drug use: Never  . Sexual activity: Not Currently  Other Topics Concern  . Not on file  Social History Narrative  . Not on file   Social Determinants of Health   Financial Resource Strain:   . Difficulty of Paying Living Expenses:   Food Insecurity:   . Worried About Charity fundraiser in the  Last Year:   . [place] in the Last Year:   Transportation Needs:   . Film/video editor (Medical):   Marland Kitchen Lack of Transportation (Non-Medical):   Physical Activity:   . Days of Exercise per Week:   . Minutes of Exercise per Session:   Stress:   . Feeling of Stress :   Social Connections:   . Frequency of Communication with Friends and Family:   . Frequency of Social Gatherings with Friends and Family:   . Attends Religious Services:   . Active Member of Clubs or Organizations:   . Attends Archivist Meetings:   Marland Kitchen Marital Status:   Intimate Partner Violence:   . Fear of Current or Ex-Partner:   . Emotionally Abused:   Marland Kitchen Physically Abused:   . Sexually Abused:      ROS- All systems are reviewed and negative except as per the HPI above.  Physical Exam: Vitals:   03/25/20 1103  BP: 140/66  Pulse: (!) 56  Weight: 63.3 kg  Height: 5\' 8"  (1.727 m)    GEN- The patient is well appearing elderly female, alert and oriented x 3 today.   Head- normocephalic, atraumatic Eyes-  Sclera clear, conjunctiva pink Ears- hearing intact Oropharynx- clear Neck- supple  Lungs- Clear to ausculation bilaterally, normal work of breathing Heart- Regular rate and rhythm, no murmurs, rubs or gallops   GI- soft, NT, ND, + BS Extremities- no clubbing, cyanosis, or edema MS- no significant deformity or atrophy Skin- no rash or lesion Psych- euthymic mood, full affect Neuro- strength and sensation are intact  Wt Readings from Last 3 Encounters:  03/25/20 63.3 kg  02/14/20 64 kg  01/07/20 64.4 kg    EKG today demonstrates SB HR 56, 1st degree AV block, PR 226, QRS 98, QTc 422  Echo 10/23/19 demonstrated  1. Left ventricular ejection fraction, by estimation, is 60 to 65%. The  left ventricle has normal function. The left ventricle has no regional  wall motion abnormalities. Left ventricular diastolic parameters are  consistent with Grade I diastolic  dysfunction (impaired relaxation).  2. Right ventricular systolic function is normal. The right ventricular  size is normal. There is mildly elevated pulmonary artery systolic  pressure.  3. Left atrial size was moderately dilated.  4. The mitral valve is abnormal. Mild mitral valve regurgitation.  5. The aortic valve is tricuspid. Aortic valve regurgitation is not  visualized.  6. The inferior vena cava is dilated in size with <50% respiratory  variability, suggesting right atrial pressure of 15 mmHg.   Epic records are reviewed at length today  CHA2DS2-VASc Score = 4  The patient's score is based upon: CHF History: 0 HTN History: 1 Age : 4 Diabetes History: 0 Stroke History: 0 Vascular Disease History: 0 Gender: 1      ASSESSMENT AND PLAN: 1. Paroxysmal Atrial Fibrillation (ICD10:  I48.0) The patient's CHA2DS2-VASc score is 4, indicating a 4.8% annual risk of stroke.   Appears to be maintaining SR. Continue flecainide 50 mg BID Continue diltiazem 120 mg daily Continue Eliquis 5 mg BID We discussed the role of an ILR given her h/o presyncope and variable heart rates. This would also help with her afib management. She is interested and has an appt with Dr Rayann Heman.   2. Secondary Hypercoagulable State (ICD10:   D68.69) The patient is at significant risk for stroke/thromboembolism based upon her CHA2DS2-VASc Score of 4.  Continue Apixaban (Eliquis).    Follow up  with Dr Rayann Heman and Dr Johnsie Cancel as scheduled. AF clinic in 6 months.    [facility] [address] [place], [place] [address] [phone] 03/25/2020 11:46 AM

## 2020-03-25 NOTE — Telephone Encounter (Signed)
Called pt to confirm virtual visit on 03/26/20. Pt was advised to check her vitals prior to appt. Pt agreed and confirmed virtual visit.

## 2020-03-26 ENCOUNTER — Telehealth (INDEPENDENT_AMBULATORY_CARE_PROVIDER_SITE_OTHER): Payer: Medicare Other | Admitting: Internal Medicine

## 2020-03-26 DIAGNOSIS — I480 Paroxysmal atrial fibrillation: Secondary | ICD-10-CM | POA: Diagnosis not present

## 2020-03-26 DIAGNOSIS — R55 Syncope and collapse: Secondary | ICD-10-CM | POA: Diagnosis not present

## 2020-03-26 DIAGNOSIS — D6869 Other thrombophilia: Secondary | ICD-10-CM

## 2020-03-26 NOTE — Progress Notes (Signed)
Electrophysiology TeleHealth Note   Due to national recommendations of social distancing due to [place] 19, Audio/video telehealth visit is felt to be most appropriate for this patient at this time.  See MyChart message from today for patient consent regarding telehealth for [name].   Date:  03/26/2020   ID:  [name], DOB 01-Jul-1930, MRN [id number]  Location: home Provider location: [place] Evaluation Performed: New patient consult  PCP:  Emeterio Reeve, DO  Cardiologist:  Dr Johnsie Cancel Electrophysiologist:  None   Chief Complaint:  afib  History of Present Illness:    [name] is a 86 y.o. female who presents via audio/video conferencing for a telehealth visit today.   The patient is referred for new consultation regarding afib by Dr Johnsie Cancel. She has a h/o afib.  She has recently been started on flecainide by Dr Johnsie Cancel.  She is asymptomatic with her afib.  She has done well since starting flecainide. Her primary concern is with "low blood pressure".  She has fallen and had episodic presyncope.  She attributes this to low BP.  She has had no episodes since starting flecainide.  Today, she denies symptoms of palpitations, chest pain, shortness of breath, orthopnea, PND, lower extremity edema, claudication, dizziness, presyncope, syncope, bleeding, or neurologic sequela. The patient is tolerating medications without difficulties and is otherwise without complaint today.     Past Medical History:  Diagnosis Date  . Actinic keratoses 07/24/2018  . Adhesive capsulitis of shoulder 12/06/2013  . Arthritis   . Atrial fibrillation ([place]) 07/17/2018  . Benign essential HTN 07/24/2018   Last Assessment & Plan:  She is doing better with adjustments in her blood pressure medication and will continue this monitoring. Last Assessment & Plan:  She is doing better with adjustments in her blood pressure medication and will continue this monitoring.  . Brittle nails  03/06/2019  . Cancer ([place])    Breast  . Complete rupture of rotator cuff 11/29/2013  . Complication of anesthesia   . Dysrhythmia    A-Fib  . GERD (gastroesophageal reflux disease)   . 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 History of breast cancer 07/11/2012   Bilateral mastectomies with reconstruction.  Marland Kitchen Hx of basal cell carcinoma excision 07/24/2018  . 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 Hx of squamous cell carcinoma of skin 07/24/2018  . Hyperlipidemia 07/24/2018  . Lentigines 03/06/2019  . Myocardial infarction ([place])    Silent Heart Attack  . Past myocardial infarction 03/06/2019  . Polycythemia   . Polycythemia vera ([place]) 07/17/2018   Last Assessment & Plan:  The patient's blood counts remain in the desired range on her current dose of Hydrea.  She will continue this medication without dose modification.  She is moving to [facility] next week and we will help her arrange for necessary follow-up there.  She will return here on an as-needed basis.  . Ptosis of eyelid 12/05/2009  . Seborrheic keratosis 03/06/2019  . 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 ABDOMINAL HYSTERECTOMY  2005  . ACHILLES TENDON SURGERY Right    2008  . ANKLE FRACTURE SURGERY Left 2000  . Breast Recontstruction Right    2004  . COLONOSCOPY W/ POLYPECTOMY    . EYE SURGERY Bilateral    Cataract  . FASCIOTOMY  2001  . FEMUR SURGERY  2018   Crushed Femur,   . HAMMER TOE SURGERY Bilateral 2014  . HERNIA REPAIR  2012   abdominal  . LAMINECTOMY  1994   L3-L4, L4-L5  (1987 C2-C3, C3-C4)  . MASTECTOMY Bilateral    1997-R, L -2004  . MENISCUS REPAIR Right 2007   arthroscopy  . SHOULDER OPEN ROTATOR CUFF REPAIR Right 2015  . SKIN CANCER EXCISION     basal, squamous, melonoma  . TONSILLECTOMY      Current Outpatient Medications  Medication Sig Dispense Refill  . apixaban (ELIQUIS) 5 MG TABS tablet Take 1 tablet (5 mg total) by mouth 2 (two) times daily. 60 tablet  9  . Ascorbic Acid (VITAMIN C PO) Take 3 tablets by mouth daily.    . Cholecalciferol (VITAMIN D3) 50 MCG (2000 UT) TABS Take 2,000 Units by mouth daily.    . Coenzyme Q10 (CO Q-10) 50 MG CAPS Take 50 mg by mouth daily.     Marland Kitchen diltiazem (CARDIZEM CD) 120 MG 24 hr capsule Take 1 capsule (120 mg total) by mouth daily. 90 each 3  . flecainide (TAMBOCOR) 50 MG tablet Take 1 tablet (50 mg total) by mouth 2 (two) times daily. 180 tablet 3  . hydroxyurea (HYDREA) 500 MG capsule Take 500 mg by mouth See admin instructions. Take 500 mg by mouth twice daily Monday-Friday and once daily on Saturday and Sunday    . MAGNESIUM OXIDE 400 PO Take 400 mg by mouth daily.     . Melatonin 10 MG TABS Take 10 mg by mouth at bedtime.     . Multiple Vitamins-Minerals (MULTIVITAMIN ADULT PO) Take 1 tablet by mouth daily.     . Omega-3 Fatty Acids (OMEGA-3 EPA FISH OIL PO) Take 1 capsule by mouth daily. EPA 300 mg DHA 200 mg    . OVER THE COUNTER MEDICATION Take 300 mg by mouth daily. Green tea      No current facility-administered medications for this visit.    Allergies:   Bee venom, Sulfa antibiotics, Sulfamethoxazole, Hydrocodone-acetaminophen, Meperidine, Morphine, Oxycodone hcl, Oxycodone-acetaminophen, Anesthesia s-i-40 [propofol], Hydrocodone, Lactose, Oxycodone-aspirin, Propoxyphene, Soy allergy, Codeine, Latex, and Valdecoxib   Social History:  The patient  reports that she has never smoked. She has never used smokeless tobacco. She reports previous alcohol use. She reports that she does not use drugs.   Family History:  The patient's  family history includes Heart attack in her father.    ROS:  Please see the history of present illness.   All other systems are personally reviewed and negative.    Exam:    Vital Signs:    Elderly but well appearing, alert and conversant, regular work of breathing,  good skin color Eyes- anicteric, neuro- grossly intact, skin- no apparent rash or lesions or cyanosis,  mouth- oral mucosa is pink   Labs/Other Tests and Data Reviewed:    Recent Labs: No results found for requested labs within last 8760 hours.   Wt Readings from Last 3 Encounters:  03/25/20 139 lb 9.6 oz (63.3 kg)  02/14/20 141 lb (64 kg)  01/07/20 142 lb (64.4 kg)     Other studies personally reviewed: Additional studies/ records that were reviewed today include: prior echo, ekgs, AF clinic notes, Dr Mariana Arn notes  Review of the above records today demonstrates: as above   ASSESSMENT & PLAN:    1.  Presyncope Unclear etiology Though it could be due to hypotension, I share Carmie Kanner concern for brady arrhythmia as a possible cause.  I agree that ILR is indicated.  I have offered to the patient today  but she is very clear in her decision to decline.  She states that if episodes recur, she may reconsider.  2. afib (paroxysmal) Minimally symptomatic Doing well with flecainide Given first degree AV block and advanced age, I would not advise that we increase flecainide or diltiazem in the future. chads2vasc score is 4.  Continue eliquis.  Will need to reduce dose f her weightr decreases below 60 kg Given advanced age, I would not advise ablation    Follow-up:  AF clinic every 3 months and with Dr Johnsie Cancel I will see as needed  Patient Risk:  after full review of this patients clinical status, I feel that they are at moderate risk at this time.   Today, I have spent 15 minutes with the patient with telehealth technology discussing afib and presyncope .    SignedThompson Grayer MD, [place] 03/26/2020 10:19 AM   [facility] [address] [place]  [address] [phone] (office) [phone] (fax)

## 2020-05-22 ENCOUNTER — Other Ambulatory Visit: Payer: Self-pay

## 2020-05-22 DIAGNOSIS — I4891 Unspecified atrial fibrillation: Secondary | ICD-10-CM

## 2020-05-22 MED ORDER — FLECAINIDE ACETATE 50 MG PO TABS: 50.0000 mg | ORAL_TABLET | Freq: Two times a day (BID) | ORAL | 2 refills | Status: DC

## 2020-06-01 ENCOUNTER — Other Ambulatory Visit: Payer: Self-pay

## 2020-06-01 ENCOUNTER — Emergency Department (HOSPITAL_BASED_OUTPATIENT_CLINIC_OR_DEPARTMENT_OTHER): Payer: Medicare Other

## 2020-06-01 ENCOUNTER — Emergency Department (HOSPITAL_BASED_OUTPATIENT_CLINIC_OR_DEPARTMENT_OTHER)
Admission: EM | Admit: 2020-06-01 | Discharge: 2020-06-01 | Disposition: A | Discharge status: Home / Self Care | Payer: Medicare Other | Attending: Emergency Medicine | Admitting: Emergency Medicine

## 2020-06-01 ENCOUNTER — Encounter (HOSPITAL_BASED_OUTPATIENT_CLINIC_OR_DEPARTMENT_OTHER): Payer: Self-pay | Admitting: Emergency Medicine

## 2020-06-01 DIAGNOSIS — Z7901 Long term (current) use of anticoagulants: Secondary | ICD-10-CM | POA: Insufficient documentation

## 2020-06-01 DIAGNOSIS — Z91040 Latex allergy status: Secondary | ICD-10-CM | POA: Diagnosis not present

## 2020-06-01 DIAGNOSIS — R42 Dizziness and giddiness: Secondary | ICD-10-CM | POA: Diagnosis not present

## 2020-06-01 DIAGNOSIS — S51011A Laceration without foreign body of right elbow, initial encounter: Secondary | ICD-10-CM | POA: Insufficient documentation

## 2020-06-01 DIAGNOSIS — W182XXA Fall in (into) shower or empty bathtub, initial encounter: Secondary | ICD-10-CM | POA: Insufficient documentation

## 2020-06-01 DIAGNOSIS — Y92002 Bathroom of unspecified non-institutional (private) residence single-family (private) house as the place of occurrence of the external cause: Secondary | ICD-10-CM | POA: Diagnosis not present

## 2020-06-01 DIAGNOSIS — I10 Essential (primary) hypertension: Secondary | ICD-10-CM | POA: Diagnosis not present

## 2020-06-01 DIAGNOSIS — Y93E1 Activity, personal bathing and showering: Secondary | ICD-10-CM | POA: Diagnosis not present

## 2020-06-01 DIAGNOSIS — Z853 Personal history of malignant neoplasm of breast: Secondary | ICD-10-CM | POA: Diagnosis not present

## 2020-06-01 DIAGNOSIS — S59911A Unspecified injury of right forearm, initial encounter: Secondary | ICD-10-CM | POA: Diagnosis present

## 2020-06-01 DIAGNOSIS — S63501A Unspecified sprain of right wrist, initial encounter: Secondary | ICD-10-CM | POA: Diagnosis not present

## 2020-06-01 DIAGNOSIS — W19XXXA Unspecified fall, initial encounter: Secondary | ICD-10-CM

## 2020-06-01 LAB — CBG MONITORING, ED: Glucose-Capillary: 89 mg/dL (ref 70–99)

## 2020-06-01 NOTE — ED Provider Notes (Signed)
[facility] EMERGENCY DEPARTMENT Provider Note   CSN: [id number] Arrival date & time: 06/01/20  1746     History Chief Complaint  Patient presents with  . Fall  . Hand Pain    [name] is a 86 y.o. female past medical history of A. fib (on Eliquis), hypertension, GERD, who presents for evaluation of right hand pain after a fall. She reports that this AM she was in the shower when she felt lightheaded and fell.  She states that she has this problem where her blood pressure fluctuates whenever she is in the shower which has been an ongoing issue for several years.  She reports she has to take very short showers to compensate with this.  She states that today, she started feeling lightheaded and was going to get out before she could, she had a syncopal episode.  She did not have any chest pain.  She is unsure of how she fell.  She does not think she hit her head.  She reports that when she woke up, she noticed pain, swelling noted to her right hand and wrist.  She is on Eliquis for A. fib.  She has not any nausea/vomiting, numbness/weakness.  Denies any chest pain, difficulty breathing, neck pain, back pain. Her Tetanus is UTD.   The history is provided by the patient.       Past Medical History:  Diagnosis Date  . Actinic keratoses 07/24/2018  . Adhesive capsulitis of shoulder 12/06/2013  . Arthritis   . Atrial fibrillation ([place]) 07/17/2018  . Benign essential HTN 07/24/2018   Last Assessment & Plan:  She is doing better with adjustments in her blood pressure medication and will continue this monitoring. Last Assessment & Plan:  She is doing better with adjustments in her blood pressure medication and will continue this monitoring.  . Brittle nails 03/06/2019  . Cancer ([place])    Breast  . Complete rupture of rotator cuff 11/29/2013  . Complication of anesthesia   . Dysrhythmia    A-Fib  . GERD (gastroesophageal reflux disease)   . 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 History  of breast cancer 07/11/2012   Bilateral mastectomies with reconstruction.  Marland Kitchen Hx of basal cell carcinoma excision 07/24/2018  . 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 Hx of squamous cell carcinoma of skin 07/24/2018  . Hyperlipidemia 07/24/2018  . Lentigines 03/06/2019  . Myocardial infarction ([place])    Silent Heart Attack  . Past myocardial infarction 03/06/2019  . Polycythemia   . Polycythemia vera ([place]) 07/17/2018   Last Assessment & Plan:  The patient's blood counts remain in the desired range on her current dose of Hydrea.  She will continue this medication without dose modification.  She is moving to [facility] next week and we will help her arrange for necessary follow-up there.  She will return here on an as-needed basis.  . Ptosis of eyelid 12/05/2009  . Seborrheic keratosis 03/06/2019  . 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 Secondary hypercoagulable state ([place]) 03/25/2020  . Brittle nails 03/06/2019  . Lentigines 03/06/2019  . Past myocardial infarction 03/06/2019  . Seborrheic keratosis 03/06/2019  . Closed fracture of shaft of fifth metacarpal bone of left hand 01/30/2019  . Actinic keratoses 07/24/2018  . Benign essential HTN 07/24/2018  . 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 Hx of basal cell carcinoma excision 07/24/2018  . 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  Hx of squamous cell carcinoma of skin 07/24/2018  . Hyperlipidemia 07/24/2018  . Atrial fibrillation ([place]) 07/17/2018  . Polycythemia vera ([place]) 07/17/2018  . Adhesive capsulitis of shoulder 12/06/2013  . Complete rupture of rotator cuff 11/29/2013  . History of breast cancer 07/11/2012  . 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 Ptosis of eyelid 12/05/2009    Past Surgical History:  Procedure Laterality Date  . ABDOMINAL HYSTERECTOMY  2005  . ACHILLES TENDON SURGERY Right    2008  . ANKLE FRACTURE SURGERY Left 2000  . Breast Recontstruction Right    2004  .  COLONOSCOPY W/ POLYPECTOMY    . EYE SURGERY Bilateral    Cataract  . FASCIOTOMY  2001  . FEMUR SURGERY  2018   Crushed Femur,   . HAMMER TOE SURGERY Bilateral 2014  . HERNIA REPAIR  2012   abdominal  . LAMINECTOMY  1994   L3-L4, L4-L5  (1987 C2-C3, C3-C4)  . MASTECTOMY Bilateral    1997-R, L -2004  . MENISCUS REPAIR Right 2007   arthroscopy  . SHOULDER OPEN ROTATOR CUFF REPAIR Right 2015  . SKIN CANCER EXCISION     basal, squamous, melonoma  . TONSILLECTOMY       OB History   No obstetric history on file.     Family History  Problem Relation Age of Onset  . Heart attack Father     Social History   Tobacco Use  . Smoking status: Never Smoker  . Smokeless tobacco: Never Used  Vaping Use  . Vaping Use: Never used  Substance Use Topics  . Alcohol use: Not Currently  . Drug use: Never    Home Medications Prior to Admission medications   Medication Sig Start Date End Date Taking? Authorizing Provider  apixaban (ELIQUIS) 5 MG TABS tablet Take 1 tablet (5 mg total) by mouth 2 (two) times daily. 10/12/19   Josue Hector, MD  Ascorbic Acid (VITAMIN C PO) Take 3 tablets by mouth daily.    [provider]  Cholecalciferol (VITAMIN D3) 50 MCG (2000 UT) TABS Take 2,000 Units by mouth daily.    [provider]  Coenzyme Q10 (CO Q-10) 50 MG CAPS Take 50 mg by mouth daily.  02/02/10   [provider]  diltiazem (CARDIZEM CD) 120 MG 24 hr capsule Take 1 capsule (120 mg total) by mouth daily. 02/14/20   Josue Hector, MD  flecainide (TAMBOCOR) 50 MG tablet Take 1 tablet (50 mg total) by mouth 2 (two) times daily. 05/22/20   Josue Hector, MD  hydroxyurea (HYDREA) 500 MG capsule Take 500 mg by mouth See admin instructions. Take 500 mg by mouth twice daily Monday-Friday and once daily on Saturday and Sunday 04/03/18   [provider]  MAGNESIUM OXIDE 400 PO Take 400 mg by mouth daily.     [provider]  Melatonin 10 MG TABS Take 10 mg  by mouth at bedtime.     [provider]  Multiple Vitamins-Minerals (MULTIVITAMIN ADULT PO) Take 1 tablet by mouth daily.  11/01/11   [provider]  Omega-3 Fatty Acids (OMEGA-3 EPA FISH OIL PO) Take 1 capsule by mouth daily. EPA 300 mg DHA 200 mg    [provider]  OVER THE COUNTER MEDICATION Take 300 mg by mouth daily. Green tea     [provider]    Allergies    Bee venom, Sulfa antibiotics, Sulfamethoxazole, Hydrocodone-acetaminophen, Meperidine, Morphine, Oxycodone hcl, Oxycodone-acetaminophen, Anesthesia s-i-40 [propofol], Hydrocodone, Lactose,  Oxycodone-aspirin, Propoxyphene, Soy allergy, Codeine, Latex, and Valdecoxib  Review of Systems   Review of Systems  Constitutional: Negative for fever.  Respiratory: Negative for cough and shortness of breath.   Cardiovascular: Negative for chest pain.  Gastrointestinal: Negative for abdominal pain, nausea and vomiting.  Genitourinary: Negative for dysuria and hematuria.  Musculoskeletal:       RUE pain  Skin: Positive for wound.  Neurological: Negative for weakness, numbness and headaches.  All other systems reviewed and are negative.   Physical Exam Updated Vital Signs BP 111/86 (BP Location: Right Arm)   Pulse (!) 58   Temp 98.2 F (36.8 C) (Oral)   Resp 17   SpO2 99%   Physical Exam Vitals and nursing note reviewed.  Constitutional:      Appearance: Normal appearance. She is well-developed.  HENT:     Head: Normocephalic and atraumatic.     Comments: No tenderness to palpation of skull. No deformities or crepitus noted. No open wounds, abrasions or lacerations.  Eyes:     General: Lids are normal.     Conjunctiva/sclera: Conjunctivae normal.     Pupils: Pupils are equal, round, and reactive to light.     Comments: PERRL. EOMs intact. No nystagmus. No neglect.   Neck:     Comments: Full flexion/extension and lateral movement of neck fully intact. No bony midline tenderness. No  deformities or crepitus.  Cardiovascular:     Rate and Rhythm: Normal rate and regular rhythm.     Pulses: Normal pulses.          Radial pulses are 2+ on the right side and 2+ on the left side.       Dorsalis pedis pulses are 2+ on the right side and 2+ on the left side.     Heart sounds: Normal heart sounds. No murmur heard.  No friction rub. No gallop.   Pulmonary:     Effort: Pulmonary effort is normal.     Breath sounds: Normal breath sounds.     Comments: Lungs clear to auscultation bilaterally.  Symmetric chest rise.  No wheezing, rales, rhonchi. Chest:     Comments: No anterior chest wall tenderness.  No deformity or crepitus noted.  No evidence of flail chest. Abdominal:     Palpations: Abdomen is soft. Abdomen is not rigid.     Tenderness: There is no abdominal tenderness. There is no guarding.     Comments: Abdomen is soft, non-distended, non-tender. No rigidity, No guarding. No peritoneal signs.  Musculoskeletal:        General: Normal range of motion.     Cervical back: Full passive range of motion without pain.     Comments: No midline T or L-spine tenderness.  No pelvic instability.  Tenderness palpation noted to the right wrist with overlying soft tissue swelling.  She can wiggle all 5 digits but does report some pain to the dorsal aspect of the hand.  Bony tenderness noted to the mid right forearm.  No no crepitus or deformity noted.  No bony tenderness of the right elbow, right shoulder.  No tenderness palpation left upper extremity.  Skin:    General: Skin is warm and dry.     Capillary Refill: Capillary refill takes less than 2 seconds.     Comments: Small V-shaped skin tear noted to the dorsal aspect of the right hand just proximal to the base of the thumb.  Neurological:     Mental Status: She is alert and  oriented to person, place, and time.  Psychiatric:        Speech: Speech normal.     ED Results / Procedures / Treatments   Labs (all labs ordered are  listed, but only abnormal results are displayed) Labs Reviewed  CBG MONITORING, ED    EKG EKG Interpretation  Date/Time:  Sunday June 01 2020 17:56:19 EDT Ventricular Rate:  59 PR Interval:    QRS Duration: 101 QT Interval:  446 QTC Calculation: 442 R Axis:   -16 Text Interpretation: Sinus rhythm Prolonged PR interval Borderline left axis deviation Anteroseptal infarct, old Confirmed by Madalyn Rob [phone]) on 06/01/2020 7:47:15 PM   Radiology DG Forearm Right  Result Date: 06/01/2020 CLINICAL DATA:  Pain EXAM: RIGHT FOREARM - 2 VIEW COMPARISON:  None. FINDINGS: There are degenerative changes of the radiocarpal joint. There is no acute displaced fracture or dislocation. No radiopaque foreign body. No elbow joint effusion. IMPRESSION: No acute displaced fracture or dislocation. Degenerative changes of the radiocarpal joint. Electronically Signed   By: Constance Holster M.D.   On: 06/01/2020 19:27   CT Head Wo Contrast  Result Date: 06/01/2020 CLINICAL DATA:  86 year old female with facial trauma. EXAM: CT HEAD WITHOUT CONTRAST TECHNIQUE: Contiguous axial images were obtained from the base of the skull through the vertex without intravenous contrast. COMPARISON:  Head CT dated 07/19/2018. FINDINGS: Brain: Moderate age-related atrophy and chronic microvascular ischemic changes. There is no acute intracranial hemorrhage. No mass effect midline shift. No extra-axial fluid collection. Vascular: No hyperdense vessel or unexpected calcification. Skull: Normal. Negative for fracture or focal lesion. Sinuses/Orbits: No acute finding. Other: None IMPRESSION: 1. No acute intracranial pathology. 2. Moderate age-related atrophy and chronic microvascular ischemic changes. Electronically Signed   By: Anner Crete M.D.   On: 06/01/2020 19:20   CT Cervical Spine Wo Contrast  Result Date: 06/01/2020 CLINICAL DATA:  Fall, C-spine injury EXAM: CT CERVICAL SPINE WITHOUT CONTRAST TECHNIQUE:  Multidetector CT imaging of the cervical spine was performed without intravenous contrast. Multiplanar CT image reconstructions were also generated. COMPARISON:  07/19/2018 FINDINGS: Alignment: 2 mm anterolisthesis of C4 upon C5 is stable from prior examination. Otherwise normal cervical lordosis. Skull base and vertebrae: The craniocervical junction is unremarkable. Advanced degenerative changes are noted at the atlantodental articulation. A subcortical lucencies seen within the dens anteriorly, similar to that noted on prior examination, likely representing a subchondral cyst secondary to synovial overgrowth and erosion at the atlantodental articulation. No other lytic or blastic bone lesion. Left C7 laminotomy has been performed, unchanged. No acute fracture of the cervical spine. Soft tissues and spinal canal: No prevertebral fluid or swelling. No visible canal hematoma. Disc levels: Review of the sagittal reformats demonstrates diffuse intervertebral disc space narrowing and endplate remodeling throughout the cervical spine in keeping with changes of diffuse moderate to severe degenerative disc disease. This is most severe at C5-6 and C6-7 which appears progressive since prior examination with progressive disc height loss. Vertebral body height has been preserved. Review of the axial images demonstrates multilevel uncovertebral and facet arthrosis resulting in multilevel neural foraminal narrowing, most severe on the right at C4-5 and, to a lesser extent bilaterally at C5-6. The spinal canal is widely patent. Upper chest: The visualized lung apices are unremarkable. Other: None significant IMPRESSION: No acute fracture of the cervical spine. Advanced multilevel degenerative disc and degenerative joint disease resulting in severe neural foraminal narrowing on the right at C4-5. Electronically Signed   By: Fidela Salisbury MD  On: 06/01/2020 19:29   DG Hand Complete Right  Result Date: 06/01/2020 CLINICAL  DATA:  Pain EXAM: RIGHT HAND - COMPLETE 3+ VIEW COMPARISON:  None. FINDINGS: There is no acute displaced fracture or dislocation. There are advanced degenerative changes at the first [facility]. There is osteoarthritis involving the interphalangeal joints. IMPRESSION: 1. No acute displaced fracture or dislocation. 2. Advanced degenerative changes at the first [facility]. Electronically Signed   By: Constance Holster M.D.   On: 06/01/2020 19:26    Procedures .Marland KitchenLaceration Repair  Date/Time: 06/01/2020 9:08 PM Performed by: Volanda Napoleon, PA-C Authorized by: Volanda Napoleon, PA-C   Consent:    Consent obtained:  Verbal   Consent given by:  Patient   Risks discussed:  Infection, need for additional repair, pain, poor cosmetic result and poor wound healing   Alternatives discussed:  No treatment and delayed treatment Universal protocol:    Procedure explained and questions answered to patient or proxy's satisfaction: yes     Relevant documents present and verified: yes     Test results available and properly labeled: yes     Imaging studies available: yes     Required blood products, implants, devices, and special equipment available: yes     Site/side marked: yes     Immediately prior to procedure, a time out was called: yes     Patient identity confirmed:  Verbally with patient Anesthesia (see MAR for exact dosages):    Anesthesia method:  None Laceration details:    Location:  Hand   Hand location:  R hand, dorsum   Length (cm):  1.5 Repair type:    Repair type:  Simple Pre-procedure details:    Preparation:  Patient was prepped and draped in usual sterile fashion Exploration:    Hemostasis achieved with:  Direct pressure   Wound exploration: wound explored through full range of motion   Treatment:    Area cleansed with:  Betadine   Amount of cleaning:  Standard   Irrigation solution:  Sterile saline   Visualized foreign bodies/material removed: no   Skin repair:    Repair method:   Tissue adhesive Post-procedure details:    Patient tolerance of procedure:  Tolerated well, no immediate complications   (including critical care time)  Medications Ordered in ED Medications - No data to display  ED Course  I have reviewed the triage vital signs and the nursing notes.  Pertinent labs & imaging results that were available during my care of the patient were reviewed by me and considered in my medical decision making (see chart for details).    MDM Rules/Calculators/A&P                          86 y.o. F with PMH/o via EMS for evaluation of fall.  Patient reports she was in the shower this morning and states that her blood pressure fluctuated which is happened before.  She states this caused her to fall.  No chest pain.  She does not know if she hit her head.  She is on Eliquis.  She denies any vomiting, numbness/weakness.  She reports pain noted to her right upper extremity.  On initial arrival, she is afebrile, nontoxic-appearing.  Vital signs are stable.  On exam, she has tenderness palpation in the right upper extremity.  No obvious head injury but given that she was on Eliquis, will plan to scan her head.  She has a small V-shaped skin  tear noted to the dorsal aspect of the right hand this will likely require Dermabond and not need suturing.  Her Tdap is up-to-date.  Patient states this happens to her because of her issues with blood pressure.  She states that is not a new occurrence for her.  She denies any chest pain.  I do not suspect this is ACS etiology.  Additionally, patient does not have any complaints of chest pain or difficulty breathing.  History/physical exam not concerning for dissection.  We will plan to obtain imaging of her arm and CT head, CT C-spine given that she is on Eliquis.  CT head negative for any acute abnormality.  CT C-spine negative for any acute cervical fracture.  Hand x-ray shows no evidence of acute fracture.  X-ray forearm shows no acute  fracture or dislocation.  Skin tear repaired as documented above with Dermabond.  We will plan to treat as a sprain with splint.  Patient encouraged at home supportive care measures.  At this time, patient has been ambulatory in the ED.  She does not have any complaints.  CBG is 89.  Patient reports that she feels good and denies any concerns. At this time, patient exhibits no emergent life-threatening condition that require further evaluation in ED. Discussed patient with Dr. Roslynn Amble who is agreeable to plan. Patient had ample opportunity for questions and discussion. All patient's questions were answered with full understanding. Strict return precautions discussed. Patient expresses understanding and agreement to plan.   Portions of this note were generated with [employer]. Dictation errors may occur despite best attempts at proofreading.   Final Clinical Impression(s) / ED Diagnoses Final diagnoses:  Fall, initial encounter  Skin tear of right elbow without complication, initial encounter  Sprain of right wrist, initial encounter    Rx / DC Orders ED Discharge Orders    None       Volanda Napoleon, PA-C 06/01/20 2200    Lucrezia Starch, MD 06/02/20 1239

## 2020-06-01 NOTE — ED Notes (Signed)
Crackers, cheese and water provided to pt

## 2020-06-01 NOTE — ED Notes (Signed)
[place] to be called when ready for [place]. [phone].

## 2020-06-01 NOTE — Discharge Instructions (Signed)
As we discussed, the Dermabond on your hand will come off when it is ready.  Follow the RICE (Rest, Ice, Compression, Elevation) protocol as directed.   Wear the brace for support and stabilization.  I provided you up with an orthopedic referral they can follow-up with you if your wrist continues to have pain, swelling.  Return the emergency department for any worsening pain, vomiting, difficulty walking or any other worsening concerning symptoms.

## 2020-06-01 NOTE — ED Triage Notes (Addendum)
Pt to ED via [facility] with s/p fall at 8am today; c/o RT hand pain and lac; sts fell in the shower and thinks she might have passed out

## 2020-06-02 ENCOUNTER — Other Ambulatory Visit: Payer: Self-pay

## 2020-06-02 ENCOUNTER — Emergency Department (HOSPITAL_COMMUNITY)
Admission: EM | Admit: 2020-06-02 | Discharge: 2020-06-02 | Disposition: A | Discharge status: Home / Self Care | Payer: Medicare Other | Attending: Emergency Medicine | Admitting: Emergency Medicine

## 2020-06-02 ENCOUNTER — Encounter (HOSPITAL_COMMUNITY): Payer: Self-pay | Admitting: Emergency Medicine

## 2020-06-02 DIAGNOSIS — Z853 Personal history of malignant neoplasm of breast: Secondary | ICD-10-CM | POA: Diagnosis not present

## 2020-06-02 DIAGNOSIS — I10 Essential (primary) hypertension: Secondary | ICD-10-CM | POA: Insufficient documentation

## 2020-06-02 DIAGNOSIS — W182XXD Fall in (into) shower or empty bathtub, subsequent encounter: Secondary | ICD-10-CM | POA: Insufficient documentation

## 2020-06-02 DIAGNOSIS — Z7901 Long term (current) use of anticoagulants: Secondary | ICD-10-CM | POA: Insufficient documentation

## 2020-06-02 DIAGNOSIS — S6991XD Unspecified injury of right wrist, hand and finger(s), subsequent encounter: Secondary | ICD-10-CM

## 2020-06-02 DIAGNOSIS — M79641 Pain in right hand: Secondary | ICD-10-CM | POA: Insufficient documentation

## 2020-06-02 DIAGNOSIS — S60921D Unspecified superficial injury of right hand, subsequent encounter: Secondary | ICD-10-CM | POA: Diagnosis not present

## 2020-06-02 DIAGNOSIS — W182XXA Fall in (into) shower or empty bathtub, initial encounter: Secondary | ICD-10-CM | POA: Diagnosis not present

## 2020-06-02 DIAGNOSIS — Z91040 Latex allergy status: Secondary | ICD-10-CM | POA: Diagnosis not present

## 2020-06-02 MED ORDER — TRAMADOL HCL 50 MG PO TABS
50.0000 mg | ORAL_TABLET | Freq: Four times a day (QID) | ORAL | Status: DC
  Administered 2020-06-02: 50 mg via ORAL
  Filled 2020-06-02: qty 1

## 2020-06-02 MED ORDER — ACETAMINOPHEN 325 MG PO TABS
650.0000 mg | ORAL_TABLET | Freq: Once | ORAL | Status: AC
  Administered 2020-06-02: 650 mg via ORAL
  Filled 2020-06-02: qty 2

## 2020-06-02 MED ORDER — DICLOFENAC SODIUM 1 % EX GEL
2.0000 g | Freq: Four times a day (QID) | CUTANEOUS | Status: DC
  Filled 2020-06-02 (×2): qty 100

## 2020-06-02 MED ORDER — ACETAMINOPHEN 500 MG PO TABS: 1000.0000 mg | ORAL_TABLET | Freq: Three times a day (TID) | ORAL | 0 refills | Status: AC | PRN

## 2020-06-02 MED ORDER — TRAMADOL HCL 50 MG PO TABS: 50.0000 mg | ORAL_TABLET | Freq: Four times a day (QID) | ORAL | 0 refills | Status: AC | PRN

## 2020-06-02 NOTE — ED Provider Notes (Addendum)
[facility] DEPT Provider Note   CSN: [id number] Arrival date & time: 06/02/20  2297     History Chief Complaint  Patient presents with  . Fall    [name] is a 86 y.o. female with past medical history of A. fib on Eliquis, polycythemia vera, hypertension who presents to the ED for worsening pain of her right hand after a fall yesterday.  Patient was evaluated in the ED at [facility] and had imaging of her right hand and arm and CT cervical spine CT head which were all unremarkable.  Dermabond was applied to the dorsal aspect of the right hand. Serous discharge with a splint. Patient states that pain started early this morning that woke her up from sleep. She took off the splint due to feeling tight. She also complains of hand swelling. She has not taking anything for pain.  HPI     Past Medical History:  Diagnosis Date  . Actinic keratoses 07/24/2018  . Adhesive capsulitis of shoulder 12/06/2013  . Arthritis   . Atrial fibrillation ([place]) 07/17/2018  . Benign essential HTN 07/24/2018   Last Assessment & Plan:  She is doing better with adjustments in her blood pressure medication and will continue this monitoring. Last Assessment & Plan:  She is doing better with adjustments in her blood pressure medication and will continue this monitoring.  . Brittle nails 03/06/2019  . Cancer ([place])    Breast  . Complete rupture of rotator cuff 11/29/2013  . Complication of anesthesia   . Dysrhythmia    A-Fib  . GERD (gastroesophageal reflux disease)   . 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 History of breast cancer 07/11/2012   Bilateral mastectomies with reconstruction.  Marland Kitchen Hx of basal cell carcinoma excision 07/24/2018  . 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 Hx of squamous cell carcinoma of skin 07/24/2018  . Hyperlipidemia 07/24/2018  . Lentigines 03/06/2019  . Myocardial infarction ([place])    Silent Heart Attack  . Past myocardial infarction 03/06/2019  .  Polycythemia   . Polycythemia vera ([place]) 07/17/2018   Last Assessment & Plan:  The patient's blood counts remain in the desired range on her current dose of Hydrea.  She will continue this medication without dose modification.  She is moving to [facility] next week and we will help her arrange for necessary follow-up there.  She will return here on an as-needed basis.  . Ptosis of eyelid 12/05/2009  . Seborrheic keratosis 03/06/2019  . 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 Hand injury, right, subsequent encounter 06/02/2020  . Secondary hypercoagulable state ([place]) 03/25/2020  . Brittle nails 03/06/2019  . Lentigines 03/06/2019  . Past myocardial infarction 03/06/2019  . Seborrheic keratosis 03/06/2019  . Closed fracture of shaft of fifth metacarpal bone of left hand 01/30/2019  . Actinic keratoses 07/24/2018  . Benign essential HTN 07/24/2018  . 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 Hx of basal cell carcinoma excision 07/24/2018  . 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 Hx of squamous cell carcinoma of skin 07/24/2018  . Hyperlipidemia 07/24/2018  . Atrial fibrillation ([place]) 07/17/2018  . Polycythemia vera ([place]) 07/17/2018  . Adhesive capsulitis of shoulder 12/06/2013  . Complete rupture of rotator cuff 11/29/2013  . History of breast cancer 07/11/2012  . 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 Ptosis of eyelid 12/05/2009    Past Surgical History:  Procedure Laterality Date  .  ABDOMINAL HYSTERECTOMY  2005  . ACHILLES TENDON SURGERY Right    2008  . ANKLE FRACTURE SURGERY Left 2000  . Breast Recontstruction Right    2004  . COLONOSCOPY W/ POLYPECTOMY    . EYE SURGERY Bilateral    Cataract  . FASCIOTOMY  2001  . FEMUR SURGERY  2018   Crushed Femur,   . HAMMER TOE SURGERY Bilateral 2014  . HERNIA REPAIR  2012   abdominal  . LAMINECTOMY  1994   L3-L4, L4-L5  (1987 C2-C3, C3-C4)  . MASTECTOMY Bilateral    1997-R, L -2004  .  MENISCUS REPAIR Right 2007   arthroscopy  . SHOULDER OPEN ROTATOR CUFF REPAIR Right 2015  . SKIN CANCER EXCISION     basal, squamous, melonoma  . TONSILLECTOMY       OB History   No obstetric history on file.     Family History  Problem Relation Age of Onset  . Heart attack Father     Social History   Tobacco Use  . Smoking status: Never Smoker  . Smokeless tobacco: Never Used  Vaping Use  . Vaping Use: Never used  Substance Use Topics  . Alcohol use: Not Currently  . Drug use: Never    Home Medications Prior to Admission medications   Medication Sig Start Date End Date Taking? Authorizing Provider  apixaban (ELIQUIS) 5 MG TABS tablet Take 1 tablet (5 mg total) by mouth 2 (two) times daily. 10/12/19  Yes Josue Hector, MD  Ascorbic Acid (VITAMIN C PO) Take 3,000 mg by mouth daily.    Yes [provider]  Cholecalciferol (VITAMIN D3) 50 MCG (2000 UT) TABS Take 2,000 Units by mouth daily.   Yes [provider]  Coenzyme Q10 (CO Q-10) 50 MG CAPS Take 50 mg by mouth daily.  02/02/10  Yes [provider]  diltiazem (CARDIZEM CD) 120 MG 24 hr capsule Take 1 capsule (120 mg total) by mouth daily. 02/14/20  Yes Josue Hector, MD  flecainide (TAMBOCOR) 50 MG tablet Take 1 tablet (50 mg total) by mouth 2 (two) times daily. 05/22/20  Yes Josue Hector, MD  hydroxyurea (HYDREA) 500 MG capsule Take 500 mg by mouth See admin instructions. Take 500 mg by mouth twice daily Monday-Friday and once daily on Saturday and Sunday 04/03/18  Yes [provider]  MAGNESIUM OXIDE 400 PO Take 400 mg by mouth daily.    Yes [provider]  Melatonin 10 MG TABS Take 10 mg by mouth at bedtime.    Yes [provider]  Multiple Vitamins-Minerals (MULTIVITAMIN ADULT PO) Take 1 tablet by mouth daily.  11/01/11  Yes [provider]  Omega-3 Fatty Acids (OMEGA-3 EPA FISH OIL PO) Take 1 capsule by mouth daily. EPA 300 mg DHA 200 mg   Yes [provider]  OVER THE COUNTER MEDICATION Take 300 mg by mouth daily. Green tea    Yes [provider]  acetaminophen (TYLENOL) 500 MG tablet Take 2 tablets (1,000 mg total) by mouth every 8 (eight) hours as needed for up to 7 days. 06/02/20 06/09/20  Gaylan Gerold, DO  traMADol (ULTRAM) 50 MG tablet Take 1 tablet (50 mg total) by mouth every 6 (six) hours as needed for up to 15 days for moderate pain or severe pain. 06/02/20 06/17/20  Gaylan Gerold, DO    Allergies    Bee venom, Sulfa antibiotics, Sulfamethoxazole, Hydrocodone-acetaminophen, Meperidine, Morphine, Oxycodone hcl, Oxycodone-acetaminophen, Anesthesia s-i-40 [propofol], Hydrocodone, Lactose,  Oxycodone-aspirin, Propoxyphene, Soy allergy, Codeine, Latex, and Valdecoxib  Review of Systems   Review of Systems  Constitutional: Negative for fever.  Musculoskeletal: Positive for joint swelling.       Hand swelling   Skin: Negative.   Psychiatric/Behavioral: The patient is nervous/anxious.     Physical Exam Updated Vital Signs BP (!) 131/94   Pulse (!) 58   Temp 97.7 F (36.5 C)   SpO2 98%   Physical Exam Constitutional:      General: She is in acute distress.  HENT:     Head: Normocephalic.  Eyes:     General:        Right eye: No discharge.        Left eye: No discharge.  Cardiovascular:     Rate and Rhythm: Normal rate and regular rhythm.  Pulmonary:     Effort: No respiratory distress.  Musculoskeletal:     Comments: Swelling of the dorsal aspect of her hand. Normal radial pulse palpated. Very tender to palpation of her hand and distal forearm. No tenderness to palpation of right shoulder. Limited range of motion of right elbow.  Skin:    General: Skin is warm.  Neurological:     Mental Status: She is alert.  Psychiatric:     Comments: Anxious     ED Results / Procedures / Treatments   Labs (all labs ordered are listed, but only abnormal results are displayed) Labs Reviewed - No data to  display  EKG None  Radiology DG Forearm Right  Result Date: 06/01/2020 CLINICAL DATA:  Pain EXAM: RIGHT FOREARM - 2 VIEW COMPARISON:  None. FINDINGS: There are degenerative changes of the radiocarpal joint. There is no acute displaced fracture or dislocation. No radiopaque foreign body. No elbow joint effusion. IMPRESSION: No acute displaced fracture or dislocation. Degenerative changes of the radiocarpal joint. Electronically Signed   By: Constance Holster M.D.   On: 06/01/2020 19:27   CT Head Wo Contrast  Result Date: 06/01/2020 CLINICAL DATA:  86 year old female with facial trauma. EXAM: CT HEAD WITHOUT CONTRAST TECHNIQUE: Contiguous axial images were obtained from the base of the skull through the vertex without intravenous contrast. COMPARISON:  Head CT dated 07/19/2018. FINDINGS: Brain: Moderate age-related atrophy and chronic microvascular ischemic changes. There is no acute intracranial hemorrhage. No mass effect midline shift. No extra-axial fluid collection. Vascular: No hyperdense vessel or unexpected calcification. Skull: Normal. Negative for fracture or focal lesion. Sinuses/Orbits: No acute finding. Other: None IMPRESSION: 1. No acute intracranial pathology. 2. Moderate age-related atrophy and chronic microvascular ischemic changes. Electronically Signed   By: Anner Crete M.D.   On: 06/01/2020 19:20   CT Cervical Spine Wo Contrast  Result Date: 06/01/2020 CLINICAL DATA:  Fall, C-spine injury EXAM: CT CERVICAL SPINE WITHOUT CONTRAST TECHNIQUE: Multidetector CT imaging of the cervical spine was performed without intravenous contrast. Multiplanar CT image reconstructions were also generated. COMPARISON:  07/19/2018 FINDINGS: Alignment: 2 mm anterolisthesis of C4 upon C5 is stable from prior examination. Otherwise normal cervical lordosis. Skull base and vertebrae: The craniocervical junction is unremarkable. Advanced degenerative changes are noted at the atlantodental  articulation. A subcortical lucencies seen within the dens anteriorly, similar to that noted on prior examination, likely representing a subchondral cyst secondary to synovial overgrowth and erosion at the atlantodental articulation. No other lytic or blastic bone lesion. Left C7 laminotomy has been performed, unchanged. No acute fracture of the cervical spine. Soft tissues and spinal canal: No prevertebral fluid or swelling. No visible  canal hematoma. Disc levels: Review of the sagittal reformats demonstrates diffuse intervertebral disc space narrowing and endplate remodeling throughout the cervical spine in keeping with changes of diffuse moderate to severe degenerative disc disease. This is most severe at C5-6 and C6-7 which appears progressive since prior examination with progressive disc height loss. Vertebral body height has been preserved. Review of the axial images demonstrates multilevel uncovertebral and facet arthrosis resulting in multilevel neural foraminal narrowing, most severe on the right at C4-5 and, to a lesser extent bilaterally at C5-6. The spinal canal is widely patent. Upper chest: The visualized lung apices are unremarkable. Other: None significant IMPRESSION: No acute fracture of the cervical spine. Advanced multilevel degenerative disc and degenerative joint disease resulting in severe neural foraminal narrowing on the right at C4-5. Electronically Signed   By: Fidela Salisbury MD   On: 06/01/2020 19:29   DG Hand Complete Right  Result Date: 06/01/2020 CLINICAL DATA:  Pain EXAM: RIGHT HAND - COMPLETE 3+ VIEW COMPARISON:  None. FINDINGS: There is no acute displaced fracture or dislocation. There are advanced degenerative changes at the first [facility]. There is osteoarthritis involving the interphalangeal joints. IMPRESSION: 1. No acute displaced fracture or dislocation. 2. Advanced degenerative changes at the first [facility]. Electronically Signed   By: Constance Holster M.D.   On: 06/01/2020 19:26     Procedures Procedures (including critical care time)  Medications Ordered in ED Medications  traMADol (ULTRAM) tablet 50 mg (50 mg Oral Given 06/02/20 0813)  diclofenac Sodium (VOLTAREN) 1 % topical gel 2 g (has no administration in time range)  acetaminophen (TYLENOL) tablet 650 mg (650 mg Oral Given 06/02/20 0813)    ED Course  I have reviewed the triage vital signs and the nursing notes.  Pertinent labs & imaging results that were available during my care of the patient were reviewed by me and considered in my medical decision making (see chart for details).  Patient seen and examined. She is very tender to palpation of her right hand. Swelling noticed at dorsal right hand. Normal pulses palpated. X-ray of right hand obtained 1 day prior did not show any fracture. However she may have an undiagnosed fracture. Tramadol and Tylenol was given for pain with Voltaren gel and ice pack.  BP (!) 114/93   Pulse 65   Temp 97.7 F (36.5 C)   SpO2 100%   Pain improved with Tylenol and tramadol.  Patient will be discharged home with pain medications.  We will change the splint to a thumb spica splint.  Patient is emphasized to keep the splint on.  Hand surgery PA was consulted and does not recommend any immediate interventions at this time.  She will follow up with an orthopedics outpatient.  Come back to the ED for worsening symptoms.  Patient agrees with the plan   MDM Rules/Calculators/A&P                          Patient presents to the ED for acute worsening of right hand pain. She had a fall the date 1 day prior and was evaluated in [facility] ED. X-ray of right hand and arm was negative for fracture or dislocation. CT cervical spine and head were also negative. Patient was placed on a splint which she took up last night. Her right hand is very tender to palpation with swelling. Pulses palpated with normal sensation of hand which makes compartment syndrome unlikely. She may  have an hidden fracture that was not seen on x-ray.  Her pain is well controlled with tramadol and Tylenol.  Patient will be discharged with pain management with tramadol and Tylenol.  Change the splint to a thumb spica splint.  Patient will call and follow-up with orthopedic surgeon, phone number provided.  Come back to the ED for worsening symptoms.    Final Clinical Impression(s) / ED Diagnoses Final diagnoses:  Hand injury, right, subsequent encounter    Rx / DC Orders ED Discharge Orders         Ordered    traMADol (ULTRAM) 50 MG tablet  Every 6 hours PRN        06/02/20 1033    acetaminophen (TYLENOL) 500 MG tablet  Every 8 hours PRN        06/02/20 1033           Gaylan Gerold, DO 06/02/20 [place], [place], DO 06/02/20 1131    Blanchie Dessert, MD 06/04/20 1329

## 2020-06-02 NOTE — ED Notes (Addendum)
[facility] contacted regarding transportation. Reported the will call back with a time

## 2020-06-02 NOTE — ED Notes (Signed)
[facility] will be here in 25 mins to transport patient home.

## 2020-06-02 NOTE — ED Triage Notes (Signed)
[facility] transferred pt from [facility] and reports the following:  Fell yesterday morning in shower. Painful and swollen right wrist and hand. AOx4.

## 2020-06-02 NOTE — Discharge Instructions (Addendum)
Ms. Hollars, it is a pleasure getting to know you today.  You came in for pain of right hand after the fall yesterday.  We will prescribe Tylenol and tramadol for pain, please take them as instructed.  I also change your splint to a thumb spica splint that helps better with immobilization.  Please call Dr. Grandville Silos, the orthopedic surgeon at [phone].  His address is [address], [place], [place] [address].  Take care

## 2020-06-03 ENCOUNTER — Telehealth: Payer: Self-pay | Admitting: Cardiovascular Disease

## 2020-06-03 NOTE — Telephone Encounter (Signed)
She does not need to see me Monday should f/u with her primary Can hold cardizem and should have CBC/BMET

## 2020-06-03 NOTE — Telephone Encounter (Signed)
Patient has passed out in the past, a couple of years ago **

## 2020-06-03 NOTE — Telephone Encounter (Signed)
Pt c/o Syncope: STAT if syncope occurred within 30 minutes and pt complains of lightheadedness High Priority if episode of passing out, completely, today or in last 24 hours   1. Did you pass out today? no  2. When is the last time you passed out? Sunday 06/01/20 - passed out in the shower  3. Has this occurred multiple times? Has happened in the past copule  4. Did you have any symptoms prior to passing out? Low BP - 99/57 HR 69 and weakness. Patient went to the ED after her fall in the shower.

## 2020-06-03 NOTE — Telephone Encounter (Signed)
Called [name] back about her message. [name] complaining of getting very weak and BP dropping. [name] stated this passed Sunday she was in the shower and passed out. Her only injury was to the right hand. [name] stated sometimes her BP is fine at 126/62, but lately it's been really low 99/61 and 99/57. [name] stated she cannot function when BP is this low.  [name] stated she wants to see Dr. Johnsie Cancel to discuss this. [name] stated she wants to see Dr. Johnsie Cancel to know why this is happening. Made [name] an appointment on Monday to discuss her issue. [name] verbalized understanding.

## 2020-06-03 NOTE — Telephone Encounter (Signed)
Called patient with Dr. Johnsie Cancel recommendations. Patient will hold her diltiazem for now and call her PCP to get an appointment. Patient will call back and get a lab appointment when she can come in.

## 2020-06-05 NOTE — Progress Notes (Signed)
CARDIOLOGY OFFICE NOTE  Date:  06/10/2020    [name] Date of Birth: Nov 19, 1929 Medical Record #[id number]  PCP:  [name], MD  Cardiologist:  Johnsie Cancel  No chief complaint on file.   History of Present Illness: [name] is a 86 y.o. female  With a history of HLD, HTN and AF. Other issues include polycythemia - on Hydroxyurea, chronic anticoagulation with Eliquis, prior use of Flecainide,  Mild MR ,  no CAD per prior cath in 2016, prior breast cancer with bilateral mastectomy. Noted labile HTN with anxiety. Has had prior vasovagal syncope in the past - may have been precipitated by hydralazine. She is from [place].   Traveled to [place] in May 2021 at altitude and daughter complained that her BP and HR fluctuating Seen by NP 01/07/20 and ARB stopped ). Daughter [name] concerned about pre syncope in shower pulse in 27'C and some systolic BP's in 70 mmhg systolic. Was not postural during NP visit 01/07/20 Labs in [facility] including TSH , Hct, K, Troponin normal ECG with SR PAC no significant arrhythmia , heart block or PAF.   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 Passed out in shower Hurt her right hand  Xray 06/01/20 negative She indicates BP can drop to 95-100 mmHg range systolic She was told to hold her cardizem   Better with this Not postural in office today   She does not want COVID vaccine Long discussion about this   Past Medical History:  Diagnosis Date   Actinic keratoses 07/24/2018   Adhesive capsulitis of shoulder 12/06/2013   Arthritis    Atrial fibrillation ([place]) 07/17/2018   Benign essential HTN  07/24/2018   Last Assessment & Plan:  She is doing better with adjustments in her blood pressure medication and will continue this monitoring. Last Assessment & Plan:  She is doing better with adjustments in her blood pressure medication and will continue this monitoring.   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   Hx of basal cell carcinoma excision 07/24/2018   Hx of melanoma in situ 07/24/2018   Hx of squamous cell carcinoma of skin 07/24/2018   Hyperlipidemia 07/24/2018   Lentigines 03/06/2019   Myocardial infarction [facility])    Silent Heart Attack   Past myocardial infarction 03/06/2019   Polycythemia    Polycythemia vera ([place]) 07/17/2018   Last Assessment & Plan:  The patient's blood counts remain in the desired range on her current dose of Hydrea.  She will continue this medication without dose modification.  She is moving to [facility] next week and we will help her arrange for necessary follow-up there.  She will return here on an as-needed basis.   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  Past Surgical History:  Procedure Laterality Date   ABDOMINAL HYSTERECTOMY  2005   ACHILLES TENDON SURGERY Right    2008   ANKLE FRACTURE SURGERY Left 2000   Breast Recontstruction Right    2004   COLONOSCOPY W/ POLYPECTOMY     EYE SURGERY Bilateral    Cataract   FASCIOTOMY  2001   FEMUR SURGERY  2018   Crushed Femur,    HAMMER TOE SURGERY Bilateral 2014   HERNIA REPAIR  2012   abdominal   LAMINECTOMY  1994   L3-L4, L4-L5  (1987 C2-C3, C3-C4)   MASTECTOMY Bilateral    1997-R, L -2004   MENISCUS REPAIR Right 2007   arthroscopy    SHOULDER OPEN ROTATOR CUFF REPAIR Right 2015   SKIN CANCER EXCISION     basal, squamous, melonoma   TONSILLECTOMY       Medications: Current Meds  Medication Sig   apixaban (ELIQUIS) 5 MG TABS tablet Take 1 tablet (5 mg total) by mouth 2 (two) times daily.   Ascorbic Acid (VITAMIN C PO) Take 3,000 mg by mouth daily.    Cholecalciferol (VITAMIN D3) 50 MCG (2000 UT) TABS Take 2,000 Units by mouth daily.   Coenzyme Q10 (CO Q-10) 50 MG CAPS Take 50 mg by mouth daily.    flecainide (TAMBOCOR) 50 MG tablet Take 1 tablet (50 mg total) by mouth 2 (two) times daily.   hydroxyurea (HYDREA) 500 MG capsule Take 500 mg by mouth See admin instructions. Take 500 mg by mouth twice daily Monday-Friday and once daily on Saturday and Sunday   MAGNESIUM OXIDE 400 PO Take 400 mg by mouth daily.    Melatonin 10 MG TABS Take 10 mg by mouth at bedtime.    Multiple Vitamins-Minerals (MULTIVITAMIN ADULT PO) Take 1 tablet by mouth daily.    Omega-3 Fatty Acids (OMEGA-3 EPA FISH OIL PO) Take 1 capsule by mouth daily. EPA 300 mg DHA 200 mg   OVER THE COUNTER MEDICATION Take 300 mg by mouth daily. Green tea    traMADol (ULTRAM) 50 MG tablet Take 1 tablet (50 mg total) by mouth every 6 (six) hours as needed for up to 15 days for moderate pain or severe pain.     Allergies: Allergies  Allergen Reactions   Bee Venom Anaphylaxis   Sulfa Antibiotics Anaphylaxis   Sulfamethoxazole Anaphylaxis   Hydrocodone-Acetaminophen Nausea Only   Meperidine Nausea And Vomiting   Morphine Nausea And Vomiting   Oxycodone Hcl Nausea Only   Oxycodone-Acetaminophen Nausea Only   Anesthesia S-I-40 [Propofol] Nausea Only   Hydrocodone Nausea And Vomiting   Lactose Nausea And Vomiting   Oxycodone-Aspirin Nausea And Vomiting   Propoxyphene    Soy Allergy Other (See Comments)   Codeine Nausea And Vomiting   Latex Rash   Valdecoxib Nausea Only    Social History: The patient  reports that she has  never smoked. She has never used smokeless tobacco. She reports previous alcohol use. She reports that she does not use drugs.   Family History: The patient's family history includes Heart attack in her father.   Review of Systems: Please see the history of present illness.   All other systems are reviewed and negative.   Physical Exam: VS:  BP 136/72    Pulse 74    Ht 5\' 8"  (1.727 m)    Wt 142 lb 9.6 oz (64.7 kg)    SpO2 96%    BMI 21.68 kg/m  .  BMI Body mass  index is 21.68 kg/m.  Wt Readings from Last 3 Encounters:  06/10/20 142 lb 9.6 oz (64.7 kg)  03/25/20 139 lb 9.6 oz (63.3 kg)  02/14/20 141 lb (64 kg)    Affect appropriate Healthy:  appears stated age 13: normal Neck supple with no adenopathy JVP normal no bruits no thyromegaly Lungs clear with no wheezing and good diaphragmatic motion Heart:  S1/S2 no murmur, no rub, gallop or click PMI normal  Post bilateral mastectomy Abdomen: benighn, BS positve, no tenderness, no AAA no bruit.  No HSM or HJR Distal pulses intact with no bruits No edema Neuro non-focal Skin warm and dry No muscular weakness    LABORATORY DATA:  EKG:  01/05/20  from the ER - this shows sinus rhythm with PACs noted.   Labs:  01/05/20  normal H&H. Normal potassium and kidney function. Negative troponin.    Other Studies Reviewed Today:  ECHO IMPRESSIONS 10/2019   1. Left ventricular ejection fraction, by estimation, is 60 to 65%. The  left ventricle has normal function. The left ventricle has no regional  wall motion abnormalities. Left ventricular diastolic parameters are  consistent with Grade I diastolic  dysfunction (impaired relaxation).  2. Right ventricular systolic function is normal. The right ventricular  size is normal. There is mildly elevated pulmonary artery systolic  pressure.  3. Left atrial size was moderately dilated.  4. The mitral valve is abnormal. Mild mitral valve regurgitation.  5. The aortic valve is  tricuspid. Aortic valve regurgitation is not  visualized.  6. The inferior vena cava is dilated in size with <50% respiratory  variability, suggesting right atrial pressure of 15 mmHg.     Assessment/Plan:  1. Labile BP and HR - presyncope - not orthostatic ARB held see below  2. PAF:  Started on flecainide 05/22/20 with normal f/u ETT On eliquis for stroke prevention       3. Valvular heart disease - TTE March just mild MR   4. Polycythemia Vera - continue hydroxyurea f/u hematology   She will stay off all BP meds and tolerate higher systolic BP's as she tends to have Vagal episodes with intermittent low BP's    Current medicines are reviewed with the patient today.  The patient does not have concerns regarding medicines other than what has been noted above.  The following changes have been made:  See above.  Labs/ tests ordered today include:    No orders of the defined types were placed in this encounter.    Disposition:   Refer to EP/Afib clinic  Start cardizem 120 mg daily and flecainide 50 mg bid    Patient is agreeable to this plan and will call if any problems develop in the interim.   Signed: Jenkins Rouge, MD  06/10/2020 11:33 AM  [facility] [address] [place],   [address] Phone: [phone] Fax: [phone]

## 2020-06-09 ENCOUNTER — Ambulatory Visit: Payer: Medicare Other | Admitting: Cardiovascular Disease

## 2020-06-10 ENCOUNTER — Ambulatory Visit (INDEPENDENT_AMBULATORY_CARE_PROVIDER_SITE_OTHER): Payer: Medicare Other | Admitting: Cardiovascular Disease

## 2020-06-10 ENCOUNTER — Encounter: Payer: Self-pay | Admitting: Cardiovascular Disease

## 2020-06-10 ENCOUNTER — Other Ambulatory Visit: Payer: Self-pay

## 2020-06-10 VITALS — BP 136/72 | HR 74 | Ht 68.0 in | Wt 142.6 lb

## 2020-06-10 DIAGNOSIS — I480 Paroxysmal atrial fibrillation: Secondary | ICD-10-CM | POA: Diagnosis not present

## 2020-06-10 NOTE — Patient Instructions (Addendum)
Medication Instructions:  Your physician recommends that you continue on your current medications as directed. Please refer to the Current Medication list given to you today.  *If you need a refill on your cardiac medications before your next appointment, please call your pharmacy*  Lab Work: If you have labs (blood work) drawn today and your tests are completely normal, you will receive your results only by: Marland Kitchen MyChart Message (if you have MyChart) OR . A paper copy in the mail If you have any lab test that is abnormal or we need to change your treatment, we will call you to review the results.  Testing/Procedures: None ordered today.   Follow-Up: At [facility], you and your health needs are our priority.  As part of our continuing mission to provide you with exceptional heart care, we have created designated Provider Care Teams.  These Care Teams include your primary Cardiologist (physician) and Advanced Practice Providers (APPs -  Physician Assistants and Nurse Practitioners) who all work together to provide you with the care you need, when you need it.  We recommend signing up for the patient portal called "MyChart".  Sign up information is provided on this After Visit Summary.  MyChart is used to connect with patients for Virtual Visits (Telemedicine).  Patients are able to view lab/test results, encounter notes, upcoming appointments, etc.  Non-urgent messages can be sent to your provider as well.   To learn more about what you can do with MyChart, go to [email or web address].    Your next appointment:   12 month(s)  The format for your next appointment:   In Person  Provider:   You may see Dr. Johnsie Cancel or one of the following Advanced Practice Providers on your designated Care Team:    Truitt Merle, NP  Cecilie Kicks, NP  Kathyrn Drown, NP

## 2020-06-18 ENCOUNTER — Other Ambulatory Visit: Payer: Self-pay

## 2020-06-18 MED ORDER — APIXABAN 5 MG PO TABS
5.0000 mg | ORAL_TABLET | Freq: Two times a day (BID) | ORAL | 5 refills | Status: DC
Start: 2020-06-18 — End: 2020-06-20

## 2020-06-18 NOTE — Telephone Encounter (Signed)
Prescription refill request for Eliquis received.  Last office visit: Nishan 06/10/2020 Scr: 0.75, 01/05/2020 Age: 86 y.o. Weight: 64.7 kg   Prescription refill sent.

## 2020-06-20 ENCOUNTER — Other Ambulatory Visit: Payer: Self-pay | Admitting: *Deleted

## 2020-06-20 MED ORDER — APIXABAN 5 MG PO TABS
5.0000 mg | ORAL_TABLET | Freq: Two times a day (BID) | ORAL | 2 refills | Status: DC
Start: 2020-06-20 — End: 2021-04-16

## 2020-06-20 NOTE — Telephone Encounter (Signed)
Eliquis 5mg  refill request received. Patient is 86 years old, weight-64.7kg, Crea-0.76 on 05/19/2020 via [place] at [place], and last seen by Dr. Johnsie Cancel on 06/10/2020. Dose is appropriate based on dosing criteria. Will send in refill to requested pharmacy.  Please note that refill was sent to the same mail order pharmacy on 06/18/2020 but not for 90 day supply, it had 30 day supply with refills so the mail order pharmacy resent request.

## 2020-08-08 ENCOUNTER — Ambulatory Visit: Payer: Medicare Other | Admitting: Cardiovascular Disease

## 2020-08-14 ENCOUNTER — Ambulatory Visit: Payer: Medicare Other | Admitting: Cardiovascular Disease

## 2020-09-29 ENCOUNTER — Ambulatory Visit (HOSPITAL_COMMUNITY): Payer: Medicare Other | Admitting: Physician Assistant

## 2020-12-29 ENCOUNTER — Ambulatory Visit
Admission: RE | Admit: 2020-12-29 | Discharge: 2020-12-29 | Disposition: A | Discharge status: Home / Self Care | Payer: Medicare Other | Source: Ambulatory Visit | Attending: Internal Medicine | Admitting: Internal Medicine

## 2020-12-29 ENCOUNTER — Other Ambulatory Visit: Payer: Self-pay | Admitting: Internal Medicine

## 2020-12-29 DIAGNOSIS — R9389 Abnormal findings on diagnostic imaging of other specified body structures: Secondary | ICD-10-CM

## 2020-12-31 ENCOUNTER — Other Ambulatory Visit: Payer: Self-pay | Admitting: Internal Medicine

## 2020-12-31 DIAGNOSIS — R9389 Abnormal findings on diagnostic imaging of other specified body structures: Secondary | ICD-10-CM

## 2021-01-13 ENCOUNTER — Ambulatory Visit
Admission: RE | Admit: 2021-01-13 | Discharge: 2021-01-13 | Disposition: A | Discharge status: Home / Self Care | Payer: Medicare Other | Source: Ambulatory Visit | Attending: Internal Medicine | Admitting: Internal Medicine

## 2021-01-13 ENCOUNTER — Other Ambulatory Visit: Payer: Medicare Other

## 2021-01-13 DIAGNOSIS — R9389 Abnormal findings on diagnostic imaging of other specified body structures: Secondary | ICD-10-CM

## 2021-01-13 MED ORDER — IOPAMIDOL (ISOVUE-300) INJECTION 61%
75.0000 mL | Freq: Once | INTRAVENOUS | Status: AC | PRN
  Administered 2021-01-13: 75 mL via INTRAVENOUS

## 2021-01-15 ENCOUNTER — Encounter: Payer: Self-pay | Admitting: Pulmonary Disease

## 2021-01-27 ENCOUNTER — Other Ambulatory Visit: Payer: Self-pay

## 2021-01-27 ENCOUNTER — Ambulatory Visit (INDEPENDENT_AMBULATORY_CARE_PROVIDER_SITE_OTHER): Payer: Medicare Other | Admitting: Emergency Medicine

## 2021-01-27 ENCOUNTER — Encounter: Payer: Self-pay | Admitting: Emergency Medicine

## 2021-01-27 DIAGNOSIS — R918 Other nonspecific abnormal finding of lung field: Secondary | ICD-10-CM | POA: Insufficient documentation

## 2021-01-27 NOTE — Patient Instructions (Signed)
We will work on obtaining copies of your chest from [place], [place] and Dr. Lysle Rubens so that we can compare to your current CT scan of the chest.  This will help [place] know how the right middle lobe opacity is changing over time. We talked about the different options for evaluation of your right lung including serial CT scans, possibly even biopsy at some point in the future if we feel that it would be helpful. We will plan to repeat your CT scan of the chest in late August 2022 locally. Follow Dr. Lamonte Sakai in approximately 1 month so that we can review the old films.

## 2021-01-27 NOTE — Assessment & Plan Note (Signed)
Irregularly shaped almost linear opacity at the fissure, question rounded atelectasis.  She apparently had a suspected aspiration event, aspiration pneumonia earlier this year at the same time she was admitted with a fall and hip fracture.  I did discuss with her that this could be a benign finding but could also represent malignancy.  Our decision as to whether to perform biopsy will need to include assessment of risk and benefit.  I think it would be helpful to try to obtain all of her past imaging so that we can see if there is been interval change.  It would be especially helpful if this opacity has become smaller over time, would support a benign cause.  She has had previous imaging in [place], [place] with the hospitalization mentioned above and also locally with Dr. Lysle Rubens.  We will try to get all of these films and review in 1 month  We will work on obtaining copies of your chest from [place], [place] and Dr. Lysle Rubens so that we can compare to your current CT scan of the chest.  This will help [place] know how the right middle lobe opacity is changing over time. We talked about the different options for evaluation of your right lung including serial CT scans, possibly even biopsy at some point in the future if we feel that it would be helpful. We will plan to repeat your CT scan of the chest in late August 2022 locally. Follow Dr. Lamonte Sakai in approximately 1 month so that we can review the old films.

## 2021-01-27 NOTE — Progress Notes (Signed)
Subjective:    Patient ID: [name], female    DOB: [birth year], 87 y.o.   MRN: [id number]  HPI 87 year old former smoker (~15 pk-yrs) with a history of bilateral breast cancer and mastectomies, CAD/MI, atrial fibrillation, hypertension, polycythemia vera, GERD.  She had a right hip fracture earlier this year after a fall. She may have had an aspiration PNA in February - was hospitalized at the time.   She denies any dyspnea, cough. She has good functional capacity. No CP.   She had a chest x-ray done in outside institution that reportedly showed possible right middle lobe opacity.  Prompted a chest x-ray 12/29/2020 locally which I have reviewed, shows a right lobe opacity 4.3 x 1.7 cm, clear on the left.  CT scan of the chest 01/13/2021 reviewed by me, shows a 4.1 cm right middle lobe mass, no evidence of mediastinal adenopathy, 7 mm right lower lobe groundglass pulmonary nodule of unclear significance.  Care everywhere report 10/02/2020 [facility] health reviewed, showed an elongated right middle lobe opacity or consolidation 3.8 x 1.3 x 1.8 cm   Review of Systems As per HPI  Past Medical History:  Diagnosis Date  . Actinic keratoses 07/24/2018  . Adhesive capsulitis of shoulder 12/06/2013  . Arthritis   . Atrial fibrillation ([place]) 07/17/2018  . Benign essential HTN 07/24/2018   Last Assessment & Plan:  She is doing better with adjustments in her blood pressure medication and will continue this monitoring. Last Assessment & Plan:  She is doing better with adjustments in her blood pressure medication and will continue this monitoring.  . Brittle nails 03/06/2019  . Cancer ([place])    Breast  . Complete rupture of rotator cuff 11/29/2013  . Complication of anesthesia   . Dysrhythmia    A-Fib  . GERD (gastroesophageal reflux disease)   . 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 History of breast cancer 07/11/2012   Bilateral mastectomies with reconstruction.  Marland Kitchen Hx of basal cell  carcinoma excision 07/24/2018  . 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 Hx of squamous cell carcinoma of skin 07/24/2018  . Hyperlipidemia 07/24/2018  . Lentigines 03/06/2019  . Myocardial infarction ([place])    Silent Heart Attack  . Past myocardial infarction 03/06/2019  . Polycythemia   . Polycythemia vera ([place]) 07/17/2018   Last Assessment & Plan:  The patient's blood counts remain in the desired range on her current dose of Hydrea.  She will continue this medication without dose modification.  She is moving to [facility] next week and we will help her arrange for necessary follow-up there.  She will return here on an as-needed basis.  . Ptosis of eyelid 12/05/2009  . Seborrheic keratosis 03/06/2019  . 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     Family History  Problem Relation Age of Onset  . Heart attack Father      Social History   Socioeconomic History  . Marital status: Widowed    Spouse name: Not on file  . Number of children: Not on file  . Years of education: Not on file  . Highest education level: Not on file  Occupational History  . Not on file  Tobacco Use  . Smoking status: Never Smoker  . Smokeless tobacco: Never Used  Vaping Use  . Vaping Use: Never used  Substance and Sexual Activity  . Alcohol use: Not Currently  . Drug use: Never  . Sexual activity: Not Currently  Other Topics Concern  . Not on file  Social History Narrative  . Not on file   Social Determinants of Health   Financial Resource Strain: Not on file  Food Insecurity: Not on file  Transportation Needs: Not on file  Physical Activity: Not on file  Stress: Not on file  Social Connections: Not on file  Intimate Partner Violence: Not on file     Allergies  Allergen Reactions  . Bee Venom Anaphylaxis  . Sulfa Antibiotics Anaphylaxis  . Sulfamethoxazole Anaphylaxis  . Hydrocodone-Acetaminophen Nausea Only  . Meperidine Nausea And Vomiting  . Morphine Nausea And Vomiting  . Oxycodone Hcl Nausea  Only  . Oxycodone-Acetaminophen Nausea Only  . Anesthesia S-I-40 [Propofol] Nausea Only  . Hydrocodone Nausea And Vomiting  . Lactose Nausea And Vomiting  . Oxycodone-Aspirin Nausea And Vomiting  . Propoxyphene   . Soy Allergy Other (See Comments)  . Codeine Nausea And Vomiting  . Latex Rash  . Valdecoxib Nausea Only     Outpatient Medications Prior to Visit  Medication Sig Dispense Refill  . apixaban (ELIQUIS) 5 MG TABS tablet Take 1 tablet (5 mg total) by mouth 2 (two) times daily. 180 tablet 2  . Ascorbic Acid (VITAMIN C PO) Take 3,000 mg by mouth daily.     . Cholecalciferol (VITAMIN D3) 50 MCG (2000 UT) TABS Take 2,000 Units by mouth daily.    . Coenzyme Q10 (CO Q-10) 50 MG CAPS Take 50 mg by mouth daily.     . flecainide (TAMBOCOR) 50 MG tablet Take 1 tablet (50 mg total) by mouth 2 (two) times daily. 180 tablet 2  . hydroxyurea (HYDREA) 500 MG capsule Take 500 mg by mouth See admin instructions. Take 500 mg by mouth twice daily Monday-Friday and once daily on Saturday and Sunday    . MAGNESIUM OXIDE 400 PO Take 400 mg by mouth daily.     . Multiple Vitamins-Minerals (MULTIVITAMIN ADULT PO) Take 1 tablet by mouth daily.     . Omega-3 Fatty Acids (OMEGA-3 EPA FISH OIL PO) Take 1 capsule by mouth daily. EPA 300 mg DHA 200 mg    . OVER THE COUNTER MEDICATION Take 300 mg by mouth daily. Green tea    . Melatonin 10 MG TABS Take 10 mg by mouth at bedtime.      No facility-administered medications prior to visit.         Objective:   Physical Exam Vitals:   01/27/21 1503  BP: 130/72  Pulse: 74  Temp: 98.2 F (36.8 C)  TempSrc: Temporal  SpO2: 99%  Weight: 136 lb 3.2 oz (61.8 kg)  Height: 5\' 8"  (1.727 m)   Gen: Pleasant, thin elderly woman, in no distress,  normal affect  ENT: No lesions,  mouth clear,  oropharynx clear, no postnasal drip  Neck: No JVD, no stridor  Lungs: No use of accessory muscles, no crackles or wheezing on normal respiration, no wheeze on forced  expiration  Cardiovascular: RRR, heart sounds normal, no murmur or gallops, 1+ LE peripheral edema  Musculoskeletal: No deformities, no cyanosis or clubbing  Neuro: alert, awake, non focal  Skin: Warm, no lesions or rash     Assessment & Plan:  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  She apparently had a suspected aspiration event, aspiration pneumonia earlier this year at the same time she was admitted with a fall and hip fracture.  I did discuss with her that this could be a benign finding  but could also represent malignancy.  Our decision as to whether to perform biopsy will need to include assessment of risk and benefit.  I think it would be helpful to try to obtain all of her past imaging so that we can see if there is been interval change.  It would be especially helpful if this opacity has become smaller over time, would support a benign cause.  She has had previous imaging in [place], [place] with the hospitalization mentioned above and also locally with Dr. Lysle Rubens.  We will try to get all of these films and review in 1 month  We will work on obtaining copies of your chest from [place], [place] and Dr. Lysle Rubens so that we can compare to your current CT scan of the chest.  This will help [place] know how the right middle lobe opacity is changing over time. We talked about the different options for evaluation of your right lung including serial CT scans, possibly even biopsy at some point in the future if we feel that it would be helpful. We will plan to repeat your CT scan of the chest in late August 2022 locally. Follow Dr. Lamonte Sakai in approximately 1 month so that we can review the old films.    Baltazar Apo, MD, PhD 01/27/2021, 3:44 PM [facility] [phone] or if no answer before 7:00PM call [phone] For any issues after 7:00PM please call eLink [phone]

## 2021-02-05 ENCOUNTER — Institutional Professional Consult (permissible substitution): Payer: Medicare Other | Admitting: Pulmonary Disease

## 2021-02-21 ENCOUNTER — Other Ambulatory Visit: Payer: Self-pay | Admitting: Cardiovascular Disease

## 2021-02-21 DIAGNOSIS — I4891 Unspecified atrial fibrillation: Secondary | ICD-10-CM

## 2021-02-24 NOTE — Telephone Encounter (Signed)
   *  STAT* If patient is at the pharmacy, call can be transferred to refill team.   1. Which medications need to be refilled? (please list name of each medication and dose if known)   flecainide (TAMBOCOR) 50 MG tablet    2. Which pharmacy/location (including street and city if local pharmacy) is medication to be sent to? [facility] ([facility] - [place], [place]  3. Do they need a 30 day or 90 day supply? 90 days  Pt is running out of meds and [employer] need to send it to her mail

## 2021-02-25 ENCOUNTER — Telehealth: Payer: Self-pay | Admitting: Cardiovascular Disease

## 2021-02-25 NOTE — Telephone Encounter (Signed)
Pt c/o medication issue:  1. Name of Medication:  Flecainide  2. How are you currently taking this medication (dosage and times per day)?  1 tablet (50 mg total) by mouth 2 (two) times daily  3. Are you having a reaction (difficulty breathing--STAT)?  No   4. What is your medication issue?  Patient states her pharmacy notified her that her prescription is ready, but it is for Flecainide Acetate. She would like to clarify that this is correct as she states she has never been prescribed this version before. She states will be leaving home in the next 30 minutes and if her call is returned during that time she would like a detailed voice message regarding the Flecainide Acetate. Specifically, confirm the difference and whether or not it is alright to take.

## 2021-02-25 NOTE — Telephone Encounter (Signed)
Called patient back about message. Patient stated her flecainide prescription from pharmacy shows flecainide acetate and she was wondering if this is the same medication. Consulted with Pharm D, and they said it is the same medication. Called patient back with answer.

## 2021-03-03 ENCOUNTER — Ambulatory Visit (INDEPENDENT_AMBULATORY_CARE_PROVIDER_SITE_OTHER): Payer: Medicare Other | Admitting: Emergency Medicine

## 2021-03-03 ENCOUNTER — Encounter: Payer: Self-pay | Admitting: Emergency Medicine

## 2021-03-03 ENCOUNTER — Other Ambulatory Visit: Payer: Self-pay

## 2021-03-03 DIAGNOSIS — R918 Other nonspecific abnormal finding of lung field: Secondary | ICD-10-CM | POA: Diagnosis not present

## 2021-03-03 NOTE — Progress Notes (Signed)
Subjective:    Patient ID: [name], female    DOB: [birth year], 87 y.o.   MRN: [id number]  HPI 87 year old former smoker (~15 pk-yrs) with a history of bilateral breast cancer and mastectomies, CAD/MI, atrial fibrillation, hypertension, polycythemia vera, GERD.  She had a right hip fracture earlier this year after a fall. She may have had an aspiration PNA in February - was hospitalized at the time.   She denies any dyspnea, cough. She has good functional capacity. No CP.   She had a chest x-ray done in outside institution that reportedly showed possible right middle lobe opacity.  Prompted a chest x-ray 12/29/2020 locally which I have reviewed, shows a right lobe opacity 4.3 x 1.7 cm, clear on the left.  CT scan of the chest 01/13/2021 reviewed by me, shows a 4.1 cm right middle lobe mass, no evidence of mediastinal adenopathy, 7 mm right lower lobe groundglass pulmonary nodule of unclear significance.  Care everywhere report 10/02/2020 [facility] health reviewed, showed an elongated right middle lobe opacity or consolidation 3.8 x 1.3 x 1.8 cm  ROV 03/03/21 --87 year old woman, former smoker, with a history of breast cancer.  She has been found to have abnormal chest imaging with a right middle lobe opacity noted on CT chest 10/02/2020 as well as locally 12/29/2020, measured 4.3 x 1.7 cm.    Review of Systems As per HPI  Past Medical History:  Diagnosis Date   Actinic keratoses 07/24/2018   Adhesive capsulitis of shoulder 12/06/2013   Arthritis    Atrial fibrillation ([place]) 07/17/2018   Benign essential HTN 07/24/2018   Last Assessment & Plan:  She is doing better with adjustments in her blood pressure medication and will continue this monitoring. Last Assessment & Plan:  She is doing better with adjustments in her blood pressure medication and will continue this monitoring.   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   Hx of basal cell carcinoma excision 07/24/2018   Hx of melanoma in situ 07/24/2018   Hx of squamous cell carcinoma of skin 07/24/2018   Hyperlipidemia 07/24/2018   Lentigines 03/06/2019   Myocardial infarction [facility])    Silent Heart Attack   Past myocardial infarction 03/06/2019   Polycythemia    Polycythemia vera ([place]) 07/17/2018   Last Assessment & Plan:  The patient's blood counts remain in the desired range on her current dose of Hydrea.  She will continue this medication without dose modification.  She is moving to [facility] next week and we will help her arrange for necessary follow-up there.  She will return here on an as-needed basis.   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     Family History  Problem Relation Age of Onset   Heart attack Father      Social History   Socioeconomic History   Marital status: Widowed    Spouse name: Not on file   Number of children: Not on file   Years of education: Not on file   Highest education level: Not on file  Occupational History   Not on file  Tobacco Use   Smoking status: Never  Smokeless tobacco: Never  Vaping Use   Vaping Use: Never used  Substance and Sexual Activity   Alcohol use: Not Currently   Drug use: Never   Sexual activity: Not Currently  Other Topics Concern   Not on file  Social History Narrative   Not on file   Social Determinants of Health   Financial Resource Strain: Not on file  Food Insecurity: Not on file  Transportation Needs: Not on file  Physical Activity: Not on file  Stress: Not on file  Social Connections: Not on file  Intimate Partner Violence: Not on file     Allergies  Allergen Reactions   Bee Venom Anaphylaxis   Sulfa  Antibiotics Anaphylaxis   Sulfamethoxazole Anaphylaxis   Hydrocodone-Acetaminophen Nausea Only   Meperidine Nausea And Vomiting   Morphine Nausea And Vomiting   Oxycodone Hcl Nausea Only   Oxycodone-Acetaminophen Nausea Only   Anesthesia S-I-40 [Propofol] Nausea Only   Hydrocodone Nausea And Vomiting   Lactose Nausea And Vomiting   Oxycodone-Aspirin Nausea And Vomiting   Propoxyphene    Soy Allergy Other (See Comments)   Codeine Nausea And Vomiting   Latex Rash   Valdecoxib Nausea Only     Outpatient Medications Prior to Visit  Medication Sig Dispense Refill   apixaban (ELIQUIS) 5 MG TABS tablet Take 1 tablet (5 mg total) by mouth 2 (two) times daily. 180 tablet 2   Ascorbic Acid (VITAMIN C PO) Take 3,000 mg by mouth daily.      Cholecalciferol (VITAMIN D3) 50 MCG (2000 UT) TABS Take 2,000 Units by mouth daily.     Coenzyme Q10 (CO Q-10) 50 MG CAPS Take 50 mg by mouth daily.      flecainide (TAMBOCOR) 50 MG tablet Take 1 tablet (50 mg total) by mouth 2 (two) times daily. Please make yearly appt with Dr. Johnsie Cancel for October 2022 for future refills. Thank you 1st attempt 180 tablet 0   hydroxyurea (HYDREA) 500 MG capsule Take 500 mg by mouth See admin instructions. Take 500 mg by mouth twice daily Monday-Friday and once daily on Saturday and Sunday     MAGNESIUM OXIDE 400 PO Take 400 mg by mouth daily.      Multiple Vitamins-Minerals (MULTIVITAMIN ADULT PO) Take 1 tablet by mouth daily.      Omega-3 Fatty Acids (OMEGA-3 EPA FISH OIL PO) Take 1 capsule by mouth daily. EPA 300 mg DHA 200 mg     OVER THE COUNTER MEDICATION Take 300 mg by mouth daily. Green tea     Melatonin 10 MG TABS Take 10 mg by mouth at bedtime.      No facility-administered medications prior to visit.         Objective:   Physical Exam Vitals:   03/03/21 1335  BP: 130/76  Pulse: 69  Temp: 98.1 F (36.7 C)  TempSrc: Oral  SpO2: 98%  Weight: 143 lb 9.6 oz (65.1 kg)  Height: 5\' 8"  (1.727 m)   Gen:  Pleasant, thin elderly woman, in no distress,  normal affect  ENT: No lesions,  mouth clear,  oropharynx clear, no postnasal drip  Neck: No JVD, no stridor  Lungs: No use of accessory muscles, no crackles or wheezing on normal respiration, no wheeze on forced expiration  Cardiovascular: RRR, heart sounds normal, no murmur or gallops  Musculoskeletal: No deformities, no cyanosis or clubbing  Neuro: alert, awake, non focal  Skin: Warm, no lesions or rash     Assessment & Plan:  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  I do not have her old films that predate February but we will try to obtain them.  The right middle lobe linear opacity is for the most part stable.  We have planned to repeat her CT chest to look for interval change in September.  If there is growth she would be open to discussing possible bronchoscopy and biopsies.  We reviewed your CT scans today.  We will plan to repeat your CT chest in August 2022 Follow Dr. Lamonte Sakai in August after your CT scan so that we can review the results together.  Depending on the results we will discuss the potential benefits of additional work-up.    Baltazar Apo, MD, PhD 03/03/2021, 2:02 PM [facility] [phone] or if no answer before 7:00PM call [phone] For any issues after 7:00PM please call eLink [phone]

## 2021-03-03 NOTE — Patient Instructions (Addendum)
We reviewed your CT scans today.  We will plan to repeat your CT chest in August 2022 Follow Dr. Lamonte Sakai in August after your CT scan so that we can review the results together.  Depending on the results we will discuss the potential benefits of additional work-up.

## 2021-03-03 NOTE — Assessment & Plan Note (Signed)
Compared her CT chest from 09/2020 to her most recent in May.  I do not have her old films that predate February but we will try to obtain them.  The right middle lobe linear opacity is for the most part stable.  We have planned to repeat her CT chest to look for interval change in September.  If there is growth she would be open to discussing possible bronchoscopy and biopsies.  We reviewed your CT scans today.  We will plan to repeat your CT chest in August 2022 Follow Dr. Lamonte Sakai in August after your CT scan so that we can review the results together.  Depending on the results we will discuss the potential benefits of additional work-up.

## 2021-03-24 ENCOUNTER — Telehealth: Payer: Self-pay | Admitting: Emergency Medicine

## 2021-03-24 DIAGNOSIS — R918 Other nonspecific abnormal finding of lung field: Secondary | ICD-10-CM

## 2021-03-24 NOTE — Telephone Encounter (Signed)
03/03/2021 OV:   We reviewed your CT scans today. We will plan to repeat your CT chest in August 2022 Follow Dr. Lamonte Sakai in August after your CT scan so that we can review the results together.  Depending on the results we will discuss the potential benefits of additional work-up.   The pt stated that she is free anytime for her CT scan and appt with RB except these days:  AUGUST  12,15,17.    She stated that she would like to get this done as soon as possible since she is going to [place] for her grandson's wedding and leaving September 21.

## 2021-03-24 NOTE — Telephone Encounter (Signed)
RB please advise if you want to repeat the CT chest with contrast.  And will she need labs?  Her last CMP was on 02/24/2021.  thanks

## 2021-03-25 NOTE — Telephone Encounter (Signed)
CT chest is without contrast. No labs needed

## 2021-03-25 NOTE — Telephone Encounter (Signed)
Attempted to call pt but unable to reach. Left message for her to return call. 

## 2021-03-26 NOTE — Telephone Encounter (Signed)
CT sched for 8/13 at 9:20am; 9:00am arrival at [facility].  Sched f/u w/  RB for 8/24 @ 9:30am.

## 2021-03-26 NOTE — Telephone Encounter (Signed)
ATC patient at [phone] to go over information, [facility]

## 2021-03-26 NOTE — Telephone Encounter (Signed)
*  Pt aware of appt info. Nothing further needed.

## 2021-03-26 NOTE — Telephone Encounter (Signed)
Called and spoke to pt. Informed her of the recs. Pt states she cannot be scheduled for the CT on 8/12 and 8/15.    PCCs please advise when the scan is scheduled (first available) so we can schedule OV with RB after.Thanks.

## 2021-04-04 ENCOUNTER — Ambulatory Visit
Admission: RE | Admit: 2021-04-04 | Discharge: 2021-04-04 | Disposition: A | Discharge status: Home / Self Care | Payer: Medicare Other | Source: Ambulatory Visit | Attending: Emergency Medicine | Admitting: Emergency Medicine

## 2021-04-04 ENCOUNTER — Other Ambulatory Visit: Payer: Self-pay

## 2021-04-04 DIAGNOSIS — R918 Other nonspecific abnormal finding of lung field: Secondary | ICD-10-CM

## 2021-04-15 ENCOUNTER — Other Ambulatory Visit: Payer: Self-pay

## 2021-04-15 ENCOUNTER — Encounter: Payer: Self-pay | Admitting: Emergency Medicine

## 2021-04-15 ENCOUNTER — Ambulatory Visit (INDEPENDENT_AMBULATORY_CARE_PROVIDER_SITE_OTHER): Payer: Medicare Other | Admitting: Emergency Medicine

## 2021-04-15 DIAGNOSIS — R918 Other nonspecific abnormal finding of lung field: Secondary | ICD-10-CM

## 2021-04-15 NOTE — Addendum Note (Signed)
Addended by: Gavin Potters R on: 04/15/2021 12:04 PM   Modules accepted: Orders

## 2021-04-15 NOTE — Patient Instructions (Addendum)
We will perform a PET scan.  Follow with Dr Lamonte Sakai after the PET to review together.

## 2021-04-15 NOTE — Progress Notes (Signed)
Subjective:    Patient ID: [name], female    DOB: [birth year], 87 y.o.   MRN: [id number]  HPI 87 year old former smoker (~15 pk-yrs) with a history of bilateral breast cancer and mastectomies, CAD/MI, atrial fibrillation, hypertension, polycythemia vera, GERD.  She had a right hip fracture earlier this year after a fall. She may have had an aspiration PNA in February - was hospitalized at the time.   She denies any dyspnea, cough. She has good functional capacity. No CP.   She had a chest x-ray done in outside institution that reportedly showed possible right middle lobe opacity.  Prompted a chest x-ray 12/29/2020 locally which I have reviewed, shows a right lobe opacity 4.3 x 1.7 cm, clear on the left.  CT scan of the chest 01/13/2021 reviewed by me, shows a 4.1 cm right middle lobe mass, no evidence of mediastinal adenopathy, 7 mm right lower lobe groundglass pulmonary nodule of unclear significance.  Care everywhere report 10/02/2020 [facility] health reviewed, showed an elongated right middle lobe opacity or consolidation 3.8 x 1.3 x 1.8 cm  ROV 03/03/21 --87 year old woman, former smoker, with a history of breast cancer.  She has been found to have abnormal chest imaging with a right upper lobe opacity noted on CT chest 10/02/2020 as well as locally 12/29/2020, measured 4.3 x 1.7 cm.   ROV 04/15/21 --this is a follow-up visit for 87 year old former smoker (15 pack years) with a history of bilateral breast cancer.  She also has past history of A. fib, CAD/MI, hypertension, polycythemia vera, GERD.  She has been found to have a 4.3 x 1.7 cm right middle lobe opacity on chest imaging going back to 09/2020 at outside hospital. She is having a new cough, non-productive, started since last time.  She is having weakness, has had some orthostasis - following w Cardiology.   Repeat scan of the chest 04/04/2021 reviewed by me, shows a stable perifissural 4 cm mass over the right midlung  without any mediastinal or hilar lymphadenopathy, stable 6 mm groundglass nodule posterior right lower lobe   Review of Systems As per HPI  Past Medical History:  Diagnosis Date   Actinic keratoses 07/24/2018   Adhesive capsulitis of shoulder 12/06/2013   Arthritis    Atrial fibrillation ([place]) 07/17/2018   Benign essential HTN 07/24/2018   Last Assessment & Plan:  She is doing better with adjustments in her blood pressure medication and will continue this monitoring. Last Assessment & Plan:  She is doing better with adjustments in her blood pressure medication and will continue this monitoring.   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   Hx of basal cell carcinoma excision 07/24/2018   Hx of melanoma in situ 07/24/2018   Hx of squamous cell carcinoma of skin 07/24/2018   Hyperlipidemia 07/24/2018   Lentigines 03/06/2019   Myocardial infarction [facility])    Silent Heart Attack   Past myocardial infarction 03/06/2019   Polycythemia    Polycythemia vera ([place]) 07/17/2018   Last Assessment & Plan:  The patient's blood counts remain in the desired range on her current dose of Hydrea.  She will continue this medication without dose modification.  She is moving  to [facility] next week and we will help her arrange for necessary follow-up there.  She will return here on an as-needed basis.   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     Family History  Problem Relation Age of Onset   Heart attack Father      Social History   Socioeconomic History   Marital status: Widowed    Spouse name: Not on file   Number of children: Not on file   Years of education: Not on file   Highest  education level: Not on file  Occupational History   Not on file  Tobacco Use   Smoking status: Never   Smokeless tobacco: Never  Vaping Use   Vaping Use: Never used  Substance and Sexual Activity   Alcohol use: Not Currently   Drug use: Never   Sexual activity: Not Currently  Other Topics Concern   Not on file  Social History Narrative   Not on file   Social Determinants of Health   Financial Resource Strain: Not on file  Food Insecurity: Not on file  Transportation Needs: Not on file  Physical Activity: Not on file  Stress: Not on file  Social Connections: Not on file  Intimate Partner Violence: Not on file     Allergies  Allergen Reactions   Bee Venom Anaphylaxis   Sulfa Antibiotics Anaphylaxis   Sulfamethoxazole Anaphylaxis   Hydrocodone-Acetaminophen Nausea Only   Meperidine Nausea And Vomiting   Morphine Nausea And Vomiting   Oxycodone Hcl Nausea Only   Oxycodone-Acetaminophen Nausea Only   Anesthesia S-I-40 [Propofol] Nausea Only   Hydrocodone Nausea And Vomiting   Lactose Nausea And Vomiting   Oxycodone-Aspirin Nausea And Vomiting   Propoxyphene    Soy Allergy Other (See Comments)   Codeine Nausea And Vomiting   Latex Rash   Valdecoxib Nausea Only     Outpatient Medications Prior to Visit  Medication Sig Dispense Refill   apixaban (ELIQUIS) 5 MG TABS tablet Take 1 tablet (5 mg total) by mouth 2 (two) times daily. 180 tablet 2   Ascorbic Acid (VITAMIN C PO) Take 3,000 mg by mouth daily.      Cholecalciferol (VITAMIN D3) 50 MCG (2000 UT) TABS Take 2,000 Units by mouth daily.     Coenzyme Q10 (CO Q-10) 50 MG CAPS Take 50 mg by mouth daily.      flecainide (TAMBOCOR) 50 MG tablet Take 1 tablet (50 mg total) by mouth 2 (two) times daily. Please make yearly appt with Dr. Johnsie Cancel for October 2022 for future refills. Thank you 1st attempt 180 tablet 0   hydroxyurea (HYDREA) 500 MG capsule Take 500 mg by mouth See admin instructions. Take 500 mg by mouth twice  daily Monday-Friday and once daily on Saturday and Sunday     MAGNESIUM OXIDE 400 PO Take 400 mg by mouth daily.      Multiple Vitamins-Minerals (MULTIVITAMIN ADULT PO) Take 1 tablet by mouth daily.      Omega-3 Fatty Acids (OMEGA-3 EPA FISH OIL PO) Take 1 capsule by mouth daily. EPA 300 mg DHA 200 mg     OVER THE COUNTER MEDICATION Take 300 mg by mouth daily. Green tea     Melatonin 10 MG TABS Take 10 mg by mouth at bedtime.      No facility-administered medications prior to visit.         Objective:   Physical Exam Vitals:   04/15/21  1594 04/15/21 0929  BP: 140/74   Pulse: 73   Temp: 98.6 F (37 C)   TempSrc: Oral   SpO2:  98%  Weight: 140 lb 9.6 oz (63.8 kg)   Height: 5\' 7"  (1.702 m)    Gen: Pleasant, thin elderly woman, in no distress,  normal affect  ENT: No lesions,  mouth clear,  oropharynx clear, no postnasal drip  Neck: No JVD, no stridor  Lungs: No use of accessory muscles, no crackles or wheezing on normal respiration, no wheeze on forced expiration  Cardiovascular: RRR, heart sounds normal, no murmur or gallops  Musculoskeletal: No deformities, no cyanosis or clubbing  Neuro: alert, awake, non focal  Skin: Warm, no lesions or rash     Assessment & Plan:  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  We talked about possibly following this with serial imaging.  Still some suspicion for possible malignancy so we will check a PET scan now.  If hypermetabolic we will need to revisit possible options for bronchoscopy and biopsy.  This would be risky given her age and history of syncope, orthostasis.  I will follow-up with her after the PET scan to talk about our options.  Reviewed her CTs going back to 09/2020 (outside hospital) today  Time spent 42 minutes  Baltazar Apo, MD, PhD 04/15/2021, 10:15 AM [facility] [phone] or if no answer before 7:00PM call [phone] For any issues after 7:00PM please call eLink [phone]

## 2021-04-15 NOTE — Assessment & Plan Note (Signed)
Stable 4 cm right midlung mass at the fissure, either inferior right upper lobe or in the right middle lobe, no change going back to 09/2020.  We talked about possibly following this with serial imaging.  Still some suspicion for possible malignancy so we will check a PET scan now.  If hypermetabolic we will need to revisit possible options for bronchoscopy and biopsy.  This would be risky given her age and history of syncope, orthostasis.  I will follow-up with her after the PET scan to talk about our options.  Reviewed her CTs going back to 09/2020 (outside hospital) today

## 2021-04-16 ENCOUNTER — Other Ambulatory Visit: Payer: Self-pay | Admitting: Cardiovascular Disease

## 2021-04-16 NOTE — Telephone Encounter (Signed)
Prescription refill request for Eliquis received. Indication: Afib Last office visit: 06/10/20 ([facility])  Scr: 0.75  Age: 87 Weight: 63.8kg  Appropriate dose and refill sent to requested pharmacy.

## 2021-04-30 ENCOUNTER — Encounter (HOSPITAL_COMMUNITY): Payer: Medicare Other

## 2021-05-01 ENCOUNTER — Other Ambulatory Visit: Payer: Self-pay

## 2021-05-01 ENCOUNTER — Encounter (HOSPITAL_COMMUNITY)
Admission: RE | Admit: 2021-05-01 | Discharge: 2021-05-01 | Disposition: A | Discharge status: Home / Self Care | Payer: Medicare Other | Source: Ambulatory Visit | Attending: Emergency Medicine | Admitting: Emergency Medicine

## 2021-05-01 DIAGNOSIS — I2510 Atherosclerotic heart disease of native coronary artery without angina pectoris: Secondary | ICD-10-CM | POA: Diagnosis not present

## 2021-05-01 DIAGNOSIS — R918 Other nonspecific abnormal finding of lung field: Secondary | ICD-10-CM | POA: Diagnosis not present

## 2021-05-01 DIAGNOSIS — I700 Atherosclerosis of aorta: Secondary | ICD-10-CM | POA: Insufficient documentation

## 2021-05-01 LAB — GLUCOSE, CAPILLARY: Glucose-Capillary: 112 mg/dL — ABNORMAL HIGH (ref 70–99)

## 2021-05-01 MED ORDER — FLUDEOXYGLUCOSE F - 18 (FDG) INJECTION
7.0700 | Freq: Once | INTRAVENOUS | Status: AC
  Administered 2021-05-01: 7.07 via INTRAVENOUS

## 2021-05-05 ENCOUNTER — Telehealth: Payer: Self-pay | Admitting: Emergency Medicine

## 2021-05-05 NOTE — Telephone Encounter (Signed)
Pt calling back [phone]

## 2021-05-05 NOTE — Telephone Encounter (Signed)
RB the pt is calling about the PET scan results.  Please advise. Thanks

## 2021-05-06 NOTE — Telephone Encounter (Signed)
ATC LVMTCB x 1  

## 2021-05-06 NOTE — Telephone Encounter (Signed)
Reviewed the PET scan results with the patient by phone today.  The right midlung mass has not changed in size but is hypermetabolic.  Consistent with slow-growing primary lung cancer.  Further there is some nonspecific activity in the thoracic spine of unclear significance, no lytic lesions that correspond on associated imaging.  Explained both of these findings to her. Next steps for [place] will be to determine whether we feel like it makes sense to pursue tissue diagnosis and possible SBRT.  This will depend some on her philosophy for her care and how aggressive she wants to be.  Further we may want to perform an MRI of the thoracic spine to better characterize the nonspecific areas that were positive on the PET scan.  We can talk about this next time.  She will to be out of town until September 26.  I will put her in a nodule slot after that date so we can review together.

## 2021-05-08 NOTE — Telephone Encounter (Signed)
Call made to patient, confirmed DOB. Appt made.   Nothing further needed at this time.  

## 2021-05-21 ENCOUNTER — Encounter: Payer: Self-pay | Admitting: Emergency Medicine

## 2021-05-21 ENCOUNTER — Other Ambulatory Visit: Payer: Self-pay

## 2021-05-21 ENCOUNTER — Ambulatory Visit (INDEPENDENT_AMBULATORY_CARE_PROVIDER_SITE_OTHER): Payer: Medicare Other | Admitting: Emergency Medicine

## 2021-05-21 VITALS — BP 126/66 | HR 69 | Temp 97.9°F | Ht 68.0 in | Wt 142.8 lb

## 2021-05-21 DIAGNOSIS — R918 Other nonspecific abnormal finding of lung field: Secondary | ICD-10-CM | POA: Diagnosis not present

## 2021-05-21 DIAGNOSIS — R942 Abnormal results of pulmonary function studies: Secondary | ICD-10-CM | POA: Diagnosis not present

## 2021-05-21 NOTE — Progress Notes (Signed)
Subjective:    Patient ID: [name], female    DOB: 1929/11/04, 87 y.o.   MRN: [id number]  HPI 87 year old former smoker (~15 pk-yrs) with a history of bilateral breast cancer and mastectomies, CAD/MI, atrial fibrillation, hypertension, polycythemia vera, GERD.  She had a right hip fracture earlier this year after a fall. She may have had an aspiration PNA in February - was hospitalized at the time.   She denies any dyspnea, cough. She has good functional capacity. No CP.   She had a chest x-ray done in outside institution that reportedly showed possible right middle lobe opacity.  Prompted a chest x-ray 12/29/2020 locally which I have reviewed, shows a right lobe opacity 4.3 x 1.7 cm, clear on the left.  CT scan of the chest 01/13/2021 reviewed by me, shows a 4.1 cm right middle lobe mass, no evidence of mediastinal adenopathy, 7 mm right lower lobe groundglass pulmonary nodule of unclear significance.  Care everywhere report 10/02/2020 [facility] health reviewed, showed an elongated right middle lobe opacity or consolidation 3.8 x 1.3 x 1.8 cm  ROV 03/03/21 --87 year old woman, former smoker, with a history of breast cancer.  She has been found to have abnormal chest imaging with a right upper lobe opacity noted on CT chest 10/02/2020 as well as locally 12/29/2020, measured 4.3 x 1.7 cm.   ROV 04/15/21 --this is a follow-up visit for 87 year old former smoker (15 pack years) with a history of bilateral breast cancer.  She also has past history of A. fib, CAD/MI, hypertension, polycythemia vera, GERD.  She has been found to have a 4.3 x 1.7 cm right middle lobe opacity on chest imaging going back to 09/2020 at outside hospital. She is having a new cough, non-productive, started since last time.  She is having weakness, has had some orthostasis - following w Cardiology.   Repeat scan of the chest 04/04/2021 reviewed by me, shows a stable perifissural 4 cm mass over the right midlung  without any mediastinal or hilar lymphadenopathy, stable 6 mm groundglass nodule posterior right lower lobe  ROV 05/21/21 --[name] follows up today to review her CT chest and PET scan.  She is 40, former smoker (15 pack years) with history of bilateral breast cancer, A. fib, CAD/MI, hypertension, polycythemia vera.  We have been evaluating a 4.3 x 1.7 right middle lobe opacity initially identified 09/2020 and consistent with a probable lung cancer. She feels very well. She began to have some morning cough a few weeks ago. She reports that since last time she has been sleep walking.   PET scan done on 05/01/2021 reviewed by me shows that the right middle lobe mass is hypermetabolic.  There was some nonspecific mild FDG uptake at T10 but no corresponding lytic or sclerotic bone lesions on CT   Review of Systems As per HPI  Past Medical History:  Diagnosis Date   Actinic keratoses 07/24/2018   Adhesive capsulitis of shoulder 12/06/2013   Arthritis    Atrial fibrillation ([place]) 07/17/2018   Benign essential HTN 07/24/2018   Last Assessment & Plan:  She is doing better with adjustments in her blood pressure medication and will continue this monitoring. Last Assessment & Plan:  She is doing better with adjustments in her blood pressure medication and will continue this monitoring.   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   Hx of basal cell carcinoma excision 07/24/2018   Hx of melanoma in situ 07/24/2018   Hx of squamous cell carcinoma of skin 07/24/2018   Hyperlipidemia 07/24/2018   Lentigines 03/06/2019   Myocardial infarction [facility])    Silent Heart Attack   Past myocardial infarction 03/06/2019   Polycythemia    Polycythemia vera ([place])  07/17/2018   Last Assessment & Plan:  The patient's blood counts remain in the desired range on her current dose of Hydrea.  She will continue this medication without dose modification.  She is moving to [facility] next week and we will help her arrange for necessary follow-up there.  She will return here on an as-needed basis.   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     Family History  Problem Relation Age of Onset   Heart attack Father      Social History   Socioeconomic History   Marital status: Widowed    Spouse name: Not on file   Number of children: Not on file   Years of education: Not on file   Highest education level: Not on file  Occupational History   Not on file  Tobacco Use   Smoking status: Never   Smokeless tobacco: Never  Vaping Use   Vaping Use: Never used  Substance and Sexual Activity   Alcohol use: Not Currently   Drug use: Never   Sexual activity: Not Currently  Other Topics Concern   Not on file  Social History Narrative   Not on file   Social Determinants of Health   Financial Resource Strain: Not on file  Food Insecurity: Not on file  Transportation Needs: Not on file  Physical Activity: Not on file  Stress: Not on file  Social Connections: Not on file  Intimate Partner Violence: Not on file     Allergies  Allergen Reactions   Bee Venom Anaphylaxis   Sulfa Antibiotics Anaphylaxis   Sulfamethoxazole Anaphylaxis   Hydrocodone-Acetaminophen Nausea Only   Meperidine Nausea And Vomiting   Morphine Nausea And Vomiting   Oxycodone Hcl Nausea Only   Oxycodone-Acetaminophen Nausea Only   Anesthesia S-I-40 [Propofol] Nausea Only   Hydrocodone Nausea And Vomiting   Lactose Nausea And Vomiting   Oxycodone-Aspirin Nausea And Vomiting   Propoxyphene    Soy Allergy Other (See Comments)   Codeine Nausea And Vomiting   Latex Rash   Valdecoxib Nausea Only     Outpatient Medications Prior  to Visit  Medication Sig Dispense Refill   Ascorbic Acid (VITAMIN C PO) Take 3,000 mg by mouth daily.      Cholecalciferol (VITAMIN D3) 50 MCG (2000 UT) TABS Take 2,000 Units by mouth daily.     Coenzyme Q10 (CO Q-10) 50 MG CAPS Take 50 mg by mouth daily.      ELIQUIS 5 MG TABS tablet TAKE 1 TABLET (5 MG TOTAL) BY MOUTH 2 (TWO) TIMES DAILY. 180 tablet 2   flecainide (TAMBOCOR) 50 MG tablet Take 1 tablet (50 mg total) by mouth 2 (two) times daily. Please make yearly appt with Dr. Johnsie Cancel for October 2022 for future refills. Thank you 1st attempt 180 tablet 0   hydroxyurea (HYDREA) 500 MG capsule Take 500 mg by mouth See admin instructions. Take 500 mg by mouth twice daily  Monday-Friday and once daily on Saturday and Sunday     MAGNESIUM OXIDE 400 PO Take 400 mg by mouth daily.      Multiple Vitamins-Minerals (MULTIVITAMIN ADULT PO) Take 1 tablet by mouth daily.      Omega-3 Fatty Acids (OMEGA-3 EPA FISH OIL PO) Take 1 capsule by mouth daily. EPA 300 mg DHA 200 mg     OVER THE COUNTER MEDICATION Take 300 mg by mouth daily. Green tea     Melatonin 10 MG TABS Take 10 mg by mouth at bedtime.      No facility-administered medications prior to visit.         Objective:   Physical Exam Vitals:   05/21/21 0925  BP: 126/66  Pulse: 69  Temp: 97.9 F (36.6 C)  TempSrc: Oral  SpO2: 97%  Weight: 142 lb 12.8 oz (64.8 kg)  Height: 5\' 8"  (1.727 m)   Gen: Pleasant, thin elderly woman, in no distress,  normal affect  ENT: No lesions,  mouth clear,  oropharynx clear, no postnasal drip  Neck: No JVD, no stridor  Lungs: No use of accessory muscles, no crackles or wheezing on normal respiration, no wheeze on forced expiration  Cardiovascular: RRR, heart sounds normal, no murmur or gallops  Musculoskeletal: No deformities, no cyanosis or clubbing  Neuro: alert, awake, non focal  Skin: Warm, no lesions or rash     Assessment & Plan:  Mass of middle lobe of right lung Reviewed CT chest, PET  scan with her and her daughter today.  We are balancing the pros and cons of pursuing a tissue diagnosis versus watchful waiting.  Given her age and her good clinical status she would like to defer any kind of invasive work-up at this time.  I understand and agree.  We will consider bronchoscopy if she develops symptoms that we feel would need to be treated/palliated or if her mass increases in size to a point where we feel like it will imminently cause symptoms.  For now we will plan to repeat her CT in 6 months.  I will repeat it sooner if symptoms evolve. She did have some nonspecific uptake at T10 on the PET scan.  I do not see a CT scan correlate to suggest metastatic disease.  For completeness it was recommended that we could consider an MRI of the T-spine.  Discussed with her and her daughter.  She would like to go ahead and get this done.  We will order.  We reviewed your CT scans of the chest, PET scan today. We will plan to repeat your CT chest in March 2023 We will perform an MRI of the thoracic spine Please let us know if you have any new respiratory symptoms, difficulty with breathing, coughing, pain, etc. Follow Dr. Lamonte Sakai in March after your CT scan so that we can review the results together or sooner if you develop any problems.  Time spent 41 minutes  Baltazar Apo, MD, PhD 05/21/2021, 10:30 AM [facility] [phone] or if no answer before 7:00PM call [phone] For any issues after 7:00PM please call eLink [phone]

## 2021-05-21 NOTE — Patient Instructions (Addendum)
We reviewed your CT scans of the chest, PET scan today. We will plan to repeat your CT chest in March 2023 We will perform an MRI of the thoracic spine. Please let us know if you have any new respiratory symptoms, difficulty with breathing, coughing, pain, etc. Follow Dr. Lamonte Sakai in March after your CT scan so that we can review the results together or sooner if you develop any problems.

## 2021-05-21 NOTE — Assessment & Plan Note (Addendum)
Reviewed CT chest, PET scan with her and her daughter today.  We are balancing the pros and cons of pursuing a tissue diagnosis versus watchful waiting.  Given her age and her good clinical status she would like to defer any kind of invasive work-up at this time.  I understand and agree.  We will consider bronchoscopy if she develops symptoms that we feel would need to be treated/palliated or if her mass increases in size to a point where we feel like it will imminently cause symptoms.  For now we will plan to repeat her CT in 6 months.  I will repeat it sooner if symptoms evolve. She did have some nonspecific uptake at T10 on the PET scan.  I do not see a CT scan correlate to suggest metastatic disease.  For completeness it was recommended that we could consider an MRI of the T-spine.  Discussed with her and her daughter.  She would like to go ahead and get this done.  We will order.  We reviewed your CT scans of the chest, PET scan today. We will plan to repeat your CT chest in March 2023 We will perform an MRI of the thoracic spine Please let us know if you have any new respiratory symptoms, difficulty with breathing, coughing, pain, etc. Follow Dr. Lamonte Sakai in March after your CT scan so that we can review the results together or sooner if you develop any problems.

## 2021-06-09 ENCOUNTER — Telehealth: Payer: Self-pay | Admitting: Emergency Medicine

## 2021-06-22 NOTE — Progress Notes (Signed)
CARDIOLOGY OFFICE NOTE  Date:  06/29/2021    [name] Date of Birth: Feb 08, 1930 Medical Record #[id number]  PCP:  [name], MD  Cardiologist:  Johnsie Cancel  No chief complaint on file.   History of Present Illness: [name] is a 87 y.o. female  With a history of HLD, HTN and AF. Other issues include polycythemia - on Hydroxyurea, chronic anticoagulation with Eliquis, prior use of Flecainide,  Mild MR ,  no CAD per prior cath in 2016, prior breast cancer with bilateral mastectomy. Noted labile HTN with anxiety. Has had prior vasovagal syncope in the past - may have been precipitated by hydralazine. She is from [place].   Traveled to [place] in May 2021 at altitude and daughter complained that her BP and HR fluctuating Seen by NP 01/07/20 and ARB stopped ). Daughter [name] concerned about pre syncope in shower pulse in 41'P and some systolic BP's in 70 mmhg systolic. Was not postural during NP visit 01/07/20 Labs in [facility] including TSH , Hct, K, Troponin normal ECG with SR PAC no significant arrhythmia , heart block or PAF.   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 Passed out in shower Hurt her right hand  Xray 06/01/20 negative She indicates BP can drop to 95-100 mmHg range systolic She was told to hold her cardizem   Has right middle lobe mass with PET uptake 05/01/21 seen by pulmonary Dr Lamonte Sakai Given age and good functional status no invasive evaluation/biopsy/bronchoscopy arranged  She is living at [facility] in [place]    Past Medical History:  Diagnosis Date   Actinic keratoses 07/24/2018   Adhesive capsulitis of  shoulder 12/06/2013   Arthritis    Atrial fibrillation ([place]) 07/17/2018   Benign essential HTN 07/24/2018   Last Assessment & Plan:  She is doing better with adjustments in her blood pressure medication and will continue this monitoring. Last Assessment & Plan:  She is doing better with adjustments in her blood pressure medication and will continue this monitoring.   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   Hx of basal cell carcinoma excision 07/24/2018   Hx of melanoma in situ 07/24/2018   Hx of squamous cell carcinoma of skin 07/24/2018   Hyperlipidemia 07/24/2018   Lentigines 03/06/2019   Myocardial infarction [facility])    Silent Heart Attack   Past myocardial infarction 03/06/2019   Polycythemia    Polycythemia vera ([place]) 07/17/2018   Last Assessment & Plan:  The patient's blood counts remain in the desired range on her current dose of Hydrea.  She will continue this medication without dose modification.  She is moving to [facility] next week and we will help her arrange for necessary follow-up there.  She will return here on an as-needed basis.  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    Past Surgical History:  Procedure Laterality Date   ABDOMINAL HYSTERECTOMY  2005   ACHILLES TENDON SURGERY Right    2008   ANKLE FRACTURE SURGERY Left 2000   Breast Recontstruction Right    2004   COLONOSCOPY W/ POLYPECTOMY     EYE SURGERY Bilateral    Cataract   FASCIOTOMY  2001   FEMUR SURGERY  2018   Crushed Femur,    HAMMER TOE SURGERY Bilateral 2014   HERNIA REPAIR  2012   abdominal   LAMINECTOMY  1994   L3-L4, L4-L5  (1987 C2-C3, C3-C4)   MASTECTOMY Bilateral    1997-R,  L -2004   MENISCUS REPAIR Right 2007   arthroscopy   SHOULDER OPEN ROTATOR CUFF REPAIR Right 2015   SKIN CANCER EXCISION     basal, squamous, melonoma   TONSILLECTOMY       Medications: Current Meds  Medication Sig   Ascorbic Acid (VITAMIN C PO) Take 3,000 mg by mouth daily.    Cholecalciferol (VITAMIN D3) 50 MCG (2000 UT) TABS Take 2,000 Units by mouth daily.   Coenzyme Q10 (CO Q-10) 50 MG CAPS Take 50 mg by mouth daily.    ELIQUIS 5 MG TABS tablet TAKE 1 TABLET (5 MG TOTAL) BY MOUTH 2 (TWO) TIMES DAILY.   flecainide (TAMBOCOR) 50 MG tablet Take 1 tablet (50 mg total) by mouth 2 (two) times daily. Please make yearly appt with Dr. Johnsie Cancel for October 2022 for future refills. Thank you 1st attempt   hydroxyurea (HYDREA) 500 MG capsule Take 500 mg by mouth See admin instructions. Take 500 mg by mouth twice daily Monday-Friday and once daily on Saturday and Sunday   MAGNESIUM OXIDE 400 PO Take 400 mg by mouth daily.    Multiple Vitamins-Minerals (MULTIVITAMIN ADULT PO) Take 1 tablet by mouth daily.    nitrofurantoin, macrocrystal-monohydrate, (MACROBID) 100 MG capsule Take 1 capsule (100 mg total) by mouth 2 (two) times daily for 5 days.   Omega-3 Fatty Acids (OMEGA-3 EPA FISH OIL PO) Take 1 capsule by mouth daily. EPA 300 mg DHA 200 mg   OVER THE COUNTER MEDICATION Take 300 mg by mouth daily. Green tea     Allergies: Allergies  Allergen Reactions   Bee Venom Anaphylaxis   Sulfa Antibiotics Anaphylaxis   Sulfamethoxazole Anaphylaxis   Hydrocodone-Acetaminophen Nausea Only   Meperidine Nausea And Vomiting   Morphine Nausea And Vomiting   Oxycodone Hcl Nausea Only   Oxycodone-Acetaminophen Nausea Only   Anesthesia S-I-40 [Propofol] Nausea Only   Caffeine Other (See Comments)   Hydrocodone Nausea And Vomiting   Lactose Nausea And Vomiting   Oxycodone-Aspirin Nausea And Vomiting   Propoxyphene    Soy Allergy Other (See Comments)   Codeine Nausea And Vomiting   Latex Rash    Valdecoxib Nausea Only    Social History: The patient  reports that she has never smoked. She has never used smokeless tobacco. She reports that she does not currently use alcohol. She reports that she does not use drugs.   Family History: The patient's family history includes Heart attack in her father.   Review of Systems: Please see the history of present illness.   All other systems are reviewed and negative.   Physical Exam: VS:  BP 132/62   Pulse 72   Ht 5\' 8"  (1.727 m)   Wt 140 lb 12.8 oz (63.9 kg)  SpO2 94%   BMI 21.41 kg/m  .  BMI Body mass index is 21.41 kg/m.  Wt Readings from Last 3 Encounters:  06/29/21 140 lb 12.8 oz (63.9 kg)  05/21/21 142 lb 12.8 oz (64.8 kg)  04/15/21 140 lb 9.6 oz (63.8 kg)    Affect appropriate Healthy:  appears stated age HEENT: normal Neck supple with no adenopathy JVP normal no bruits no thyromegaly Lungs clear with no wheezing and good diaphragmatic motion Heart:  S1/S2 no murmur, no rub, gallop or click PMI normal  Post bilateral mastectomy Abdomen: benighn, BS positve, no tenderness, no AAA no bruit.  No HSM or HJR Distal pulses intact with no bruits No edema Neuro non-focal Skin warm and dry No muscular weakness    LABORATORY DATA:  EKG:  01/05/20  from the ER - this shows sinus rhythm with PACs noted. 06/29/2021 NSR PAC otherwise normal   Labs:  01/05/20  normal H&H. Normal potassium and kidney function. Negative troponin.    Other Studies Reviewed Today:  ECHO IMPRESSIONS 10/2019    1. Left ventricular ejection fraction, by estimation, is 60 to 65%. The  left ventricle has normal function. The left ventricle has no regional  wall motion abnormalities. Left ventricular diastolic parameters are  consistent with Grade I diastolic  dysfunction (impaired relaxation).   2. Right ventricular systolic function is normal. The right ventricular  size is normal. There is mildly elevated pulmonary artery systolic  pressure.    3. Left atrial size was moderately dilated.   4. The mitral valve is abnormal. Mild mitral valve regurgitation.   5. The aortic valve is tricuspid. Aortic valve regurgitation is not  visualized.   6. The inferior vena cava is dilated in size with <50% respiratory  variability, suggesting right atrial pressure of 15 mmHg.     Assessment/Plan:  1. Labile BP and HR - presyncope - not orthostatic ARB held see below  2. PAF:  Started on flecainide 05/22/20 with normal f/u ETT On eliquis for stroke prevention       3. Valvular heart disease - TTE March 2021  just mild MR   4. Polycythemia Vera - continue hydroxyurea f/u hematology   5. Lung Mass: likely bronchogenic carcinoma f/u Dr Lamonte Sakai PET positive and former smoker Given age and functional status observation planned MRI thoracic spine 06/23/21 no evidence of metastatic dx I shared this good news with her !!  She will stay off all BP meds and tolerate higher systolic BP's as she tends to have Vagal episodes with intermittent low BP's    Current medicines are reviewed with the patient today.  The patient does not have concerns regarding medicines other than what has been noted above.  The following changes have been made:  See above.  Labs/ tests ordered today include:    No orders of the defined types were placed in this encounter.    Disposition:   Refer to EP/Afib clinic  Start cardizem 120 mg daily and flecainide 50 mg bid    Patient is agreeable to this plan and will call if any problems develop in the interim.   Signed: Jenkins Rouge, MD  06/29/2021 9:46 AM  [facility] [address] [place], [place]  [address] Phone: [phone] Fax: [phone]

## 2021-06-23 ENCOUNTER — Ambulatory Visit
Admission: RE | Admit: 2021-06-23 | Discharge: 2021-06-23 | Disposition: A | Discharge status: Home / Self Care | Payer: Medicare Other | Source: Ambulatory Visit | Attending: Emergency Medicine | Admitting: Emergency Medicine

## 2021-06-23 DIAGNOSIS — R942 Abnormal results of pulmonary function studies: Secondary | ICD-10-CM

## 2021-06-23 MED ORDER — GADOBENATE DIMEGLUMINE 529 MG/ML IV SOLN
13.0000 mL | Freq: Once | INTRAVENOUS | Status: AC | PRN
  Administered 2021-06-23: 13 mL via INTRAVENOUS

## 2021-06-28 ENCOUNTER — Emergency Department (HOSPITAL_BASED_OUTPATIENT_CLINIC_OR_DEPARTMENT_OTHER)
Admission: EM | Admit: 2021-06-28 | Discharge: 2021-06-28 | Disposition: A | Discharge status: Home / Self Care | Payer: Medicare Other | Attending: Emergency Medicine | Admitting: Emergency Medicine

## 2021-06-28 ENCOUNTER — Other Ambulatory Visit: Payer: Self-pay

## 2021-06-28 ENCOUNTER — Encounter (HOSPITAL_BASED_OUTPATIENT_CLINIC_OR_DEPARTMENT_OTHER): Payer: Self-pay | Admitting: Emergency Medicine

## 2021-06-28 DIAGNOSIS — Z91040 Latex allergy status: Secondary | ICD-10-CM | POA: Diagnosis not present

## 2021-06-28 DIAGNOSIS — Z7901 Long term (current) use of anticoagulants: Secondary | ICD-10-CM | POA: Diagnosis not present

## 2021-06-28 DIAGNOSIS — Z853 Personal history of malignant neoplasm of breast: Secondary | ICD-10-CM | POA: Diagnosis not present

## 2021-06-28 DIAGNOSIS — I10 Essential (primary) hypertension: Secondary | ICD-10-CM | POA: Diagnosis not present

## 2021-06-28 DIAGNOSIS — I4891 Unspecified atrial fibrillation: Secondary | ICD-10-CM | POA: Insufficient documentation

## 2021-06-28 DIAGNOSIS — N3001 Acute cystitis with hematuria: Secondary | ICD-10-CM | POA: Insufficient documentation

## 2021-06-28 DIAGNOSIS — R319 Hematuria, unspecified: Secondary | ICD-10-CM | POA: Diagnosis present

## 2021-06-28 DIAGNOSIS — Z85828 Personal history of other malignant neoplasm of skin: Secondary | ICD-10-CM | POA: Insufficient documentation

## 2021-06-28 LAB — URINALYSIS, ROUTINE W REFLEX MICROSCOPIC
Bilirubin Urine: NEGATIVE
Glucose, UA: NEGATIVE mg/dL
Ketones, ur: NEGATIVE mg/dL
Nitrite: NEGATIVE
Protein, ur: >300 mg/dL — AB
Specific Gravity, Urine: 1.025 (ref 1.005–1.030)
pH: 5.5 (ref 5.0–8.0)

## 2021-06-28 LAB — URINALYSIS, MICROSCOPIC (REFLEX)
RBC / HPF: >50 RBC/hpf (ref 0–5)
WBC, UA: >50 WBC/hpf (ref 0–5)

## 2021-06-28 MED ORDER — NITROFURANTOIN MONOHYD MACRO 100 MG PO CAPS: 100.0000 mg | ORAL_CAPSULE | Freq: Two times a day (BID) | ORAL | 0 refills | Status: AC

## 2021-06-28 NOTE — Discharge Instructions (Signed)
Take the antibiotic as prescribed.  Recommend following up with your primary care doctor and with urology.  If you develop any pain, vomiting, fever or other new concerning symptom, please return to ER for reassessment.

## 2021-06-28 NOTE — ED Triage Notes (Signed)
Pt came in EMS. Started having dysuria and hematuria at 3a this am. No flank pain.

## 2021-06-29 ENCOUNTER — Ambulatory Visit (INDEPENDENT_AMBULATORY_CARE_PROVIDER_SITE_OTHER): Payer: Medicare Other | Admitting: Cardiovascular Disease

## 2021-06-29 ENCOUNTER — Encounter: Payer: Self-pay | Admitting: Cardiovascular Disease

## 2021-06-29 ENCOUNTER — Other Ambulatory Visit: Payer: Self-pay

## 2021-06-29 VITALS — BP 132/62 | HR 72 | Ht 68.0 in | Wt 140.8 lb

## 2021-06-29 DIAGNOSIS — Z7901 Long term (current) use of anticoagulants: Secondary | ICD-10-CM

## 2021-06-29 DIAGNOSIS — R918 Other nonspecific abnormal finding of lung field: Secondary | ICD-10-CM | POA: Diagnosis not present

## 2021-06-29 DIAGNOSIS — R0989 Other specified symptoms and signs involving the circulatory and respiratory systems: Secondary | ICD-10-CM | POA: Diagnosis not present

## 2021-06-29 DIAGNOSIS — I480 Paroxysmal atrial fibrillation: Secondary | ICD-10-CM | POA: Diagnosis not present

## 2021-06-29 NOTE — ED Provider Notes (Signed)
[facility] EMERGENCY DEPARTMENT Provider Note   CSN: [id number] Arrival date & time: 06/28/21  0802     History Chief Complaint  Patient presents with   Dysuria    [name] is a 87 y.o. female.  Past medical history of A. fib, hypertension, lung mass followed by pulmonology presents to ER with concern for dysuria.  Patient states that yesterday she was feeling fine and had no issues with urination.  No other medical complaints.  Woke up this morning and noted some burning with urination.  Also noted that her urine seemed to be blood-tinged.  She does not have any associated abdominal pain, no nausea or vomiting, no fevers.  She is on a blood thinner for her A. fib.  Eliquis.  HPI     Past Medical History:  Diagnosis Date   Actinic keratoses 07/24/2018   Adhesive capsulitis of shoulder 12/06/2013   Arthritis    Atrial fibrillation ([place]) 07/17/2018   Benign essential HTN 07/24/2018   Last Assessment & Plan:  She is doing better with adjustments in her blood pressure medication and will continue this monitoring. Last Assessment & Plan:  She is doing better with adjustments in her blood pressure medication and will continue this monitoring.   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   Hx of basal cell carcinoma excision 07/24/2018   Hx of melanoma in situ 07/24/2018   Hx of squamous cell carcinoma of skin 07/24/2018   Hyperlipidemia 07/24/2018   Lentigines 03/06/2019   Myocardial infarction [facility])    Silent Heart Attack   Past myocardial infarction 03/06/2019   Polycythemia    Polycythemia vera ([place]) 07/17/2018   Last Assessment & Plan:  The patient's blood counts remain in the desired range on her current  dose of Hydrea.  She will continue this medication without dose modification.  She is moving to [facility] next week and we will help her arrange for necessary follow-up there.  She will return here on an as-needed basis.   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   Hx of basal cell carcinoma excision 07/24/2018   Hx of melanoma in situ 07/24/2018   Hx of squamous cell carcinoma of skin 07/24/2018   Hyperlipidemia 07/24/2018   Atrial fibrillation ([place]) 07/17/2018   Polycythemia vera ([place]) 07/17/2018   Adhesive capsulitis of shoulder 12/06/2013   Complete rupture of rotator cuff 11/29/2013   History of breast cancer 07/11/2012   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   Ptosis of eyelid 12/05/2009    Past Surgical History:  Procedure Laterality Date   ABDOMINAL HYSTERECTOMY  2005   ACHILLES TENDON SURGERY Right  2008   ANKLE FRACTURE SURGERY Left 2000   Breast Recontstruction Right    2004   COLONOSCOPY W/ POLYPECTOMY     EYE SURGERY Bilateral    Cataract   FASCIOTOMY  2001   FEMUR SURGERY  2018   Crushed Femur,    HAMMER TOE SURGERY Bilateral 2014   HERNIA REPAIR  2012   abdominal   LAMINECTOMY  1994   L3-L4, L4-L5  (1987 C2-C3, C3-C4)   MASTECTOMY Bilateral    1997-R, L -2004   MENISCUS REPAIR Right 2007   arthroscopy   SHOULDER OPEN ROTATOR CUFF REPAIR Right 2015   SKIN CANCER EXCISION     basal, squamous, melonoma    TONSILLECTOMY       OB History   No obstetric history on file.     Family History  Problem Relation Age of Onset   Heart attack Father     Social History   Tobacco Use   Smoking status: Never   Smokeless tobacco: Never  Vaping Use   Vaping Use: Never used  Substance Use Topics   Alcohol use: Not Currently   Drug use: Never    Home Medications Prior to Admission medications   Medication Sig Start Date End Date Taking? Authorizing Provider  Ascorbic Acid (VITAMIN C PO) Take 3,000 mg by mouth daily.    Yes [provider]  Cholecalciferol (VITAMIN D3) 50 MCG (2000 UT) TABS Take 2,000 Units by mouth daily.   Yes [provider]  Coenzyme Q10 (CO Q-10) 50 MG CAPS Take 50 mg by mouth daily.  02/02/10  Yes [provider]  ELIQUIS 5 MG TABS tablet TAKE 1 TABLET (5 MG TOTAL) BY MOUTH 2 (TWO) TIMES DAILY. 04/16/21  Yes Josue Hector, MD  flecainide (TAMBOCOR) 50 MG tablet Take 1 tablet (50 mg total) by mouth 2 (two) times daily. Please make yearly appt with Dr. Johnsie Cancel for October 2022 for future refills. Thank you 1st attempt 02/25/21  Yes Josue Hector, MD  hydroxyurea (HYDREA) 500 MG capsule Take 500 mg by mouth See admin instructions. Take 500 mg by mouth twice daily Monday-Friday and once daily on Saturday and Sunday 04/03/18  Yes [provider]  MAGNESIUM OXIDE 400 PO Take 400 mg by mouth daily.    Yes [provider]  Multiple Vitamins-Minerals (MULTIVITAMIN ADULT PO) Take 1 tablet by mouth daily.  11/01/11  Yes [provider]  nitrofurantoin, macrocrystal-monohydrate, (MACROBID) 100 MG capsule Take 1 capsule (100 mg total) by mouth 2 (two) times daily for 5 days. 06/28/21 07/03/21 Yes Euna Armon, Ellwood Dense, MD  Omega-3 Fatty Acids (OMEGA-3 EPA FISH OIL PO) Take 1 capsule by mouth daily. EPA 300 mg DHA 200 mg   Yes [provider]  OVER THE COUNTER MEDICATION Take 300 mg by mouth daily. Green tea   Yes [provider]    Allergies    Bee venom, Sulfa antibiotics, Sulfamethoxazole, Hydrocodone-acetaminophen, Meperidine, Morphine, Oxycodone hcl, Oxycodone-acetaminophen, Anesthesia s-i-40 [propofol], Caffeine, Hydrocodone, Lactose, Oxycodone-aspirin, Propoxyphene, Soy allergy, Codeine, Latex, and Valdecoxib  Review of Systems   Review of Systems  Constitutional:  Negative for chills and fever.  HENT:  Negative for ear pain and sore throat.   Eyes:  Negative for pain and visual disturbance.  Respiratory:  Negative for cough and shortness of breath.   Cardiovascular:  Negative for chest pain and palpitations.  Gastrointestinal:  Negative for abdominal pain and vomiting.  Genitourinary:  Positive for dysuria and hematuria.  Musculoskeletal:  Negative for arthralgias and back pain.  Skin:  Negative for color change and rash.  Neurological:  Negative for seizures and syncope.  All other systems reviewed and are negative.  Physical Exam Updated Vital Signs BP (!) 164/74   Pulse 72   Temp 97.8 F (36.6 C) (Oral)   Resp 16   SpO2 97%   Physical Exam Vitals and nursing note reviewed.  Constitutional:      General: She is not in acute distress.    Appearance: She is well-developed.  HENT:     Head: Normocephalic and atraumatic.  Eyes:     Conjunctiva/sclera: Conjunctivae normal.  Cardiovascular:     Comments: Warm and well perfused, normal distal pulses Pulmonary:     Effort: Pulmonary effort is normal. No respiratory distress.  Abdominal:     Palpations: Abdomen is soft.     Tenderness: There is no abdominal tenderness. There is no right CVA tenderness, left CVA tenderness, guarding or rebound.  Musculoskeletal:     Cervical back: Neck supple.  Skin:    General: Skin is warm and dry.  Neurological:     General: No focal deficit present.     Mental Status: She is alert.  Psychiatric:        Mood and Affect: Mood normal.    ED Results / Procedures / Treatments   Labs (all  labs ordered are listed, but only abnormal results are displayed) Labs Reviewed  URINALYSIS, ROUTINE W REFLEX MICROSCOPIC - Abnormal; Notable for the following components:      Result Value   APPearance CLOUDY (*)    Hgb urine dipstick LARGE (*)    Protein, ur >300 (*)    Leukocytes,Ua LARGE (*)    All other components within normal limits  URINALYSIS, MICROSCOPIC (REFLEX) - Abnormal; Notable for the following components:   Bacteria, UA MANY (*)    All other components within normal limits    EKG None  Radiology No results found.  Procedures Procedures   Medications Ordered in ED Medications - No data to display  ED Course  I have reviewed the triage vital signs and the nursing notes.  Pertinent labs & imaging results that were available during my care of the patient were reviewed by me and considered in my medical decision making (see chart for details).    MDM Rules/Calculators/A&P                           87 year old lady presents to ER with concern for dysuria and hematuria.  On physical exam she appears well in no distress with grossly stable vital signs.  No tenderness on abdominal exam, no CVA tenderness bilaterally.  Urinalysis concerning for UTI with hematuria.  Based on symptomatology, suspect cystitis.  Given that she has no other symptoms and has a very reassuring exam, do not feel that she needs further work-up today.  Recommend course of antibiotics and follow-up with primary care.  Additionally instructed patient that if her hematuria does not resolve with the antibiotics and treatment of UTI that she should also follow-up with a urologist.   After the discussed management above, the patient was determined to be safe for discharge.  The patient was in agreement with this plan and all questions regarding their care were answered.  ED return precautions were discussed and the patient will return to the ED with any significant worsening of condition.   Final  Clinical Impression(s) /  ED Diagnoses Final diagnoses:  Acute cystitis with hematuria    Rx / DC Orders ED Discharge Orders          Ordered    nitrofurantoin, macrocrystal-monohydrate, (MACROBID) 100 MG capsule  2 times daily        06/28/21 0955             Lucrezia Starch, MD 06/29/21 1630

## 2021-06-29 NOTE — Patient Instructions (Addendum)
Medication Instructions:  *If you need a refill on your cardiac medications before your next appointment, please call your pharmacy*  Lab Work: If you have labs (blood work) drawn today and your tests are completely normal, you will receive your results only by: [place] (if you have MyChart) OR A paper copy in the mail If you have any lab test that is abnormal or we need to change your treatment, we will call you to review the results.  Testing/Procedures: None ordered today.  Follow-Up: At [facility], you and your health needs are our priority.  As part of our continuing mission to provide you with exceptional heart care, we have created designated Provider Care Teams.  These Care Teams include your primary Cardiologist (physician) and Advanced Practice Providers (APPs -  Physician Assistants and Nurse Practitioners) who all work together to provide you with the care you need, when you need it.  We recommend signing up for the patient portal called "MyChart".  Sign up information is provided on this After Visit Summary.  MyChart is used to connect with patients for Virtual Visits (Telemedicine).  Patients are able to view lab/test results, encounter notes, upcoming appointments, etc.  Non-urgent messages can be sent to your provider as well.   To learn more about what you can do with MyChart, go to [email or web address].    Your next appointment:   12 month(s)  The format for your next appointment:   In Person  Provider:   Jenkins Rouge, MD

## 2021-06-30 NOTE — Telephone Encounter (Signed)
Pt ha thoracic MRI on 11/1. Will close encounter.

## 2021-07-01 ENCOUNTER — Encounter: Payer: Self-pay | Admitting: Emergency Medicine

## 2021-07-01 ENCOUNTER — Ambulatory Visit (INDEPENDENT_AMBULATORY_CARE_PROVIDER_SITE_OTHER): Payer: Medicare Other | Admitting: Emergency Medicine

## 2021-07-01 ENCOUNTER — Other Ambulatory Visit: Payer: Self-pay

## 2021-07-01 DIAGNOSIS — R918 Other nonspecific abnormal finding of lung field: Secondary | ICD-10-CM

## 2021-07-01 NOTE — Progress Notes (Signed)
Subjective:    Patient ID: [name], female    DOB: 1930-03-11, 87 y.o.   MRN: [id number]  HPI  ROV 05/21/21 --[name] follows up today to review her CT chest and PET scan.  She is 63, former smoker (15 pack years) with history of bilateral breast cancer, A. fib, CAD/MI, hypertension, polycythemia vera.  We have been evaluating a 4.3 x 1.7 right middle lobe opacity initially identified 09/2020 and consistent with a probable lung cancer. She feels very well. She began to have some morning cough a few weeks ago. She reports that since last time she has been sleep walking.   PET scan done on 05/01/2021 reviewed by me shows that the right middle lobe mass is hypermetabolic.  There was some nonspecific mild FDG uptake at T10 but no corresponding lytic or sclerotic bone lesions on CT  ROV 07/01/21 --follow-up visit for 87 year old woman with a history of tobacco use, bilateral breast cancer, polycythemia vera, hypertension, CAD/MI, atrial fibrillation.  We have been following her for a right mid lobe opacity (since 09/2020) consistent with lung cancer.  Hypermetabolic on PET scan 10/27/6281.  We have managed this conservatively in absence of symptoms.  Her last PET did show some nonspecific uptake at T10 which prompted an MRI thoracic spine. She has been doing well, no changes in breathing. Was just treated for cystitis.   MRI T-spine 06/23/2021 reviewed by me showed no evidence of metastatic disease to the thoracic spine.   Review of Systems As per HPI  Past Medical History:  Diagnosis Date   Actinic keratoses 07/24/2018   Adhesive capsulitis of shoulder 12/06/2013   Arthritis    Atrial fibrillation ([place]) 07/17/2018   Benign essential HTN 07/24/2018   Last Assessment & Plan:  She is doing better with adjustments in her blood pressure medication and will continue this monitoring. Last Assessment & Plan:  She is doing better with adjustments in her blood pressure medication and will continue this  monitoring.   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   Hx of basal cell carcinoma excision 07/24/2018   Hx of melanoma in situ 07/24/2018   Hx of squamous cell carcinoma of skin 07/24/2018   Hyperlipidemia 07/24/2018   Lentigines 03/06/2019   Myocardial infarction [facility])    Silent Heart Attack   Past myocardial infarction 03/06/2019   Polycythemia    Polycythemia vera ([place]) 07/17/2018   Last Assessment & Plan:  The patient's blood counts remain in the desired range on her current dose of Hydrea.  She will continue this medication without dose modification.  She is moving to [facility] next week and we will help her arrange for necessary follow-up there.  She will return here on an as-needed basis.   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     Family History  Problem Relation Age of Onset   Heart attack Father      Social History   Socioeconomic History   Marital status: Widowed    Spouse name: Not on file   Number of children: Not on file   Years of education: Not on file   Highest education level: Not  on file  Occupational History   Not on file  Tobacco Use   Smoking status: Never   Smokeless tobacco: Never  Vaping Use   Vaping Use: Never used  Substance and Sexual Activity   Alcohol use: Not Currently   Drug use: Never   Sexual activity: Not Currently  Other Topics Concern   Not on file  Social History Narrative   Not on file   Social Determinants of Health   Financial Resource Strain: Not on file  Food Insecurity: Not on file  Transportation Needs: Not on file  Physical Activity: Not on file  Stress: Not on file  Social Connections: Not on file   Intimate Partner Violence: Not on file     Allergies  Allergen Reactions   Bee Venom Anaphylaxis   Sulfa Antibiotics Anaphylaxis   Sulfamethoxazole Anaphylaxis   Hydrocodone-Acetaminophen Nausea Only   Meperidine Nausea And Vomiting   Morphine Nausea And Vomiting   Oxycodone Hcl Nausea Only   Oxycodone-Acetaminophen Nausea Only   Anesthesia S-I-40 [Propofol] Nausea Only   Caffeine Other (See Comments)   Hydrocodone Nausea And Vomiting   Lactose Nausea And Vomiting   Oxycodone-Aspirin Nausea And Vomiting   Propoxyphene    Soy Allergy Other (See Comments)   Codeine Nausea And Vomiting   Latex Rash   Valdecoxib Nausea Only     Outpatient Medications Prior to Visit  Medication Sig Dispense Refill   Ascorbic Acid (VITAMIN C PO) Take 3,000 mg by mouth daily.      Cholecalciferol (VITAMIN D3) 50 MCG (2000 UT) TABS Take 2,000 Units by mouth daily.     Coenzyme Q10 (CO Q-10) 50 MG CAPS Take 50 mg by mouth daily.      ELIQUIS 5 MG TABS tablet TAKE 1 TABLET (5 MG TOTAL) BY MOUTH 2 (TWO) TIMES DAILY. 180 tablet 2   flecainide (TAMBOCOR) 50 MG tablet Take 1 tablet (50 mg total) by mouth 2 (two) times daily. Please make yearly appt with Dr. Johnsie Cancel for October 2022 for future refills. Thank you 1st attempt 180 tablet 0   hydroxyurea (HYDREA) 500 MG capsule Take 500 mg by mouth See admin instructions. Take 500 mg by mouth twice daily Monday-Friday and once daily on Saturday and Sunday     MAGNESIUM OXIDE 400 PO Take 400 mg by mouth daily.      Multiple Vitamins-Minerals (MULTIVITAMIN ADULT PO) Take 1 tablet by mouth daily.      nitrofurantoin, macrocrystal-monohydrate, (MACROBID) 100 MG capsule Take 1 capsule (100 mg total) by mouth 2 (two) times daily for 5 days. 10 capsule 0   Omega-3 Fatty Acids (OMEGA-3 EPA FISH OIL PO) Take 1 capsule by mouth daily. EPA 300 mg DHA 200 mg     OVER THE COUNTER MEDICATION Take 300 mg by mouth daily. Green tea     No facility-administered medications prior  to visit.         Objective:   Physical Exam Vitals:   07/01/21 1148  BP: (!) 146/72  Pulse: 71  Temp: 98 F (36.7 C)  TempSrc: Oral  SpO2: 99%  Weight: 141 lb 3.2 oz (64 kg)  Height: 5\' 8"  (1.727 m)   Gen: Pleasant, thin elderly woman, in no distress,  normal affect  ENT: No lesions,  mouth clear,  oropharynx clear, no postnasal drip  Neck: No JVD, no stridor  Lungs: No use of accessory muscles, no crackles or wheezing on normal respiration, no wheeze on forced expiration  Cardiovascular: RRR,  heart sounds normal, no murmur or gallops  Musculoskeletal: No deformities, no cyanosis or clubbing  Neuro: alert, awake, non focal  Skin: Warm, no lesions or rash     Assessment & Plan:  Mass of middle lobe of right lung Right middle lobe mass that we have been managing conservatively.  She remains asymptomatic and we may be able to avoid biopsy, treatment altogether depending on how it evolves.  Her PET scan showed some possible activity in the T-spine so we performed an MRI.  This was reassuring, no evidence of metastatic disease or any correlate to the area of hypermetabolism.  We talked about this today.  I reviewed it with her and her daughter.  We will plan to repeat her CT chest in March which would be the 10-month mark.  We will consider imaging or other action sooner if she develops respiratory or constitutional symptoms.    Baltazar Apo, MD, PhD 07/01/2021, 12:21 PM [facility] [phone] or if no answer before 7:00PM call [phone] For any issues after 7:00PM please call eLink [phone]

## 2021-07-01 NOTE — Assessment & Plan Note (Signed)
Right middle lobe mass that we have been managing conservatively.  She remains asymptomatic and we may be able to avoid biopsy, treatment altogether depending on how it evolves.  Her PET scan showed some possible activity in the T-spine so we performed an MRI.  This was reassuring, no evidence of metastatic disease or any correlate to the area of hypermetabolism.  We talked about this today.  I reviewed it with her and her daughter.  We will plan to repeat her CT chest in March which would be the 12-month mark.  We will consider imaging or other action sooner if she develops respiratory or constitutional symptoms.

## 2021-07-01 NOTE — Patient Instructions (Signed)
We reviewed your MRI of the spine today.  There is no evidence of any abnormal activity on that study.  This is good news. We will plan to repeat your CT scan of the chest in March 2023. Follow Dr. Lamonte Sakai in March after your CT to review the results together.  Call sooner if you develop any new symptoms or problems.

## 2021-07-09 ENCOUNTER — Other Ambulatory Visit: Payer: Self-pay | Admitting: Internal Medicine

## 2021-07-09 DIAGNOSIS — R609 Edema, unspecified: Secondary | ICD-10-CM

## 2021-07-13 ENCOUNTER — Ambulatory Visit
Admission: RE | Admit: 2021-07-13 | Discharge: 2021-07-13 | Disposition: A | Discharge status: Home / Self Care | Payer: Medicare Other | Source: Ambulatory Visit | Attending: Internal Medicine | Admitting: Internal Medicine

## 2021-07-13 DIAGNOSIS — R609 Edema, unspecified: Secondary | ICD-10-CM

## 2021-07-29 ENCOUNTER — Other Ambulatory Visit: Payer: Self-pay | Admitting: Cardiovascular Disease

## 2021-07-29 DIAGNOSIS — I4891 Unspecified atrial fibrillation: Secondary | ICD-10-CM

## 2021-08-07 ENCOUNTER — Telehealth: Payer: Self-pay | Admitting: Cardiovascular Disease

## 2021-08-07 NOTE — Telephone Encounter (Signed)
Patient stated Dr. Lysle Rubens prescribed furosemide 20 mg daily due to patient having very swollen and painful bilateral ankles. Patient had an [place] to rule out DVT in November. Patient stated her  last BP reading 122/78, but BP is up and down all the time. Patient is scared to take the lasix. Informed patient that she could always take one pill and see how it makes her feel and keep a record of her BP's to make sure she is tolerating the lasix okay. Patient wanted to know what Dr. Johnsie Cancel thinks, will send message to him.

## 2021-08-07 NOTE — Telephone Encounter (Signed)
Pt c/o medication issue:  1. Name of Medication: furosemide 20mg   2. How are you currently taking this medication (dosage and times per day)?    3. Are you having a reaction (difficulty breathing--STAT)? no  4. What is your medication issue? Patient was calling to see if it would be okay for her to take his medication. Being that she is taken Eliquis. Please advise

## 2021-08-07 NOTE — Telephone Encounter (Signed)
Attempted to call Pt.  Requested call back.

## 2021-08-07 NOTE — Telephone Encounter (Signed)
Left detailed message that patient is fine to take furosemide as needed.

## 2021-09-01 ENCOUNTER — Other Ambulatory Visit: Payer: Self-pay

## 2021-09-01 DIAGNOSIS — M7989 Other specified soft tissue disorders: Secondary | ICD-10-CM

## 2021-09-17 ENCOUNTER — Encounter (HOSPITAL_COMMUNITY): Payer: Medicare Other

## 2021-10-05 ENCOUNTER — Telehealth: Payer: Self-pay | Admitting: Emergency Medicine

## 2021-10-07 NOTE — Telephone Encounter (Signed)
Pt is scheduled for CT on 3/1 and appt with RB is on 3/9.  She did have appt with RB on 2/22 and she had rescheduled it.  I went over appt info with her and she states CT on 3/1 is perfect.  Nothing needed.

## 2021-10-14 ENCOUNTER — Ambulatory Visit: Payer: Medicare Other | Admitting: Emergency Medicine

## 2021-10-21 ENCOUNTER — Ambulatory Visit
Admission: RE | Admit: 2021-10-21 | Discharge: 2021-10-21 | Disposition: A | Discharge status: Home / Self Care | Payer: Medicare Other | Source: Ambulatory Visit | Attending: Emergency Medicine | Admitting: Emergency Medicine

## 2021-10-21 ENCOUNTER — Other Ambulatory Visit: Payer: Self-pay

## 2021-10-21 DIAGNOSIS — R918 Other nonspecific abnormal finding of lung field: Secondary | ICD-10-CM

## 2021-10-29 ENCOUNTER — Encounter: Payer: Self-pay | Admitting: Emergency Medicine

## 2021-10-29 ENCOUNTER — Ambulatory Visit (INDEPENDENT_AMBULATORY_CARE_PROVIDER_SITE_OTHER): Payer: Medicare Other | Admitting: Emergency Medicine

## 2021-10-29 ENCOUNTER — Other Ambulatory Visit: Payer: Self-pay

## 2021-10-29 DIAGNOSIS — R918 Other nonspecific abnormal finding of lung field: Secondary | ICD-10-CM | POA: Diagnosis not present

## 2021-10-29 NOTE — Patient Instructions (Signed)
We will arrange for a PET scan to further evaluate your right middle lobe mass ?Dr. Lamonte Sakai will discuss your case in the thoracic oncology conference.  In particular we will discuss possible treatment with radiation oncology ?Please start omeprazole 20 mg once daily.  Take this medication 1 hour around food. ?Follow with Dr Lamonte Sakai in 1 month ? ?

## 2021-10-29 NOTE — Addendum Note (Signed)
Addended by: Gavin Potters R on: 10/29/2021 11:44 AM ? ? Modules accepted: Orders ? ?

## 2021-10-29 NOTE — Assessment & Plan Note (Signed)
Slowly enlarging right middle lobe mass.  We have been conservative to date, following with serial imaging.  A PET scan 04/2021 did not show any metastatic disease although there was a hypermetabolic area at J50 that had no correlate on MRI T-spine.  She is now starting to have cough and some mid chest discomfort.  While this could be due to other causes (plan to treat GERD) may be related to the mass lesion, finally causing symptoms.  If so she is interested in trying to treat.  Discussed with her the possibility of radiation/SBRT either with or without a tissue diagnosis by navigational bronchoscopy.  I will review her case with radiation oncology.  Plan to obtain a repeat PET scan to make sure this remains isolated in the mid chest.  She will follow-up with me to review, plan next steps. ?

## 2021-10-29 NOTE — Progress Notes (Signed)
? ?Subjective:  ? ? Patient ID: [name], female    DOB: 02/01/1930, 88 y.o.   MRN: [id number] ? ?HPI ? ?ROV 07/01/21 --follow-up visit for 88 year old woman with a history of tobacco use, bilateral breast cancer, polycythemia vera, hypertension, CAD/MI, atrial fibrillation.  We have been following her for a right mid lobe opacity (since 09/2020) consistent with lung cancer.  Hypermetabolic on PET scan 0/10/5595.  We have managed this conservatively in absence of symptoms.  Her last PET did show some nonspecific uptake at T10 which prompted an MRI thoracic spine. She has been doing well, no changes in breathing. Was just treated for cystitis.  ? ?MRI T-spine 06/23/2021 reviewed by me showed no evidence of metastatic disease to the thoracic spine. ? ? ?ROV 10/29/21 --88 year old woman with a history of tobacco use and breast cancer, polycythemia vera, hypertension, CAD, A-fib.  She has a suspicious right mid lobe opacity that we have been following with serial imaging conservatively, hypermetabolic on PET scan September 2022. She has noticed a dry cough, throat irritation, bothersome when talking, in the am and later at night. No sputum or hemoptysis. She feels a heaviness in her right mid chest - progressing over the last several months. She denies any rhinitis, she does have some GERD sx.  ? ?CT chest 10/21/2021 reviewed shows that the right lower lobe mass is mildly enlarged has some medial extension into the right hilum 4.6 x 2.5 cm.  There is also increased nodularity around the inferior aspect of the right major fissure.  No mediastinal or hilar adenopathy.  No effusion ? ? ?Review of Systems ?As per HPI ? ? ?   ?Objective:  ? Physical Exam ?Vitals:  ? 10/29/21 0854  ?BP: 120/68  ?Pulse: 71  ?Temp: 97.8 ?F (36.6 ?C)  ?TempSrc: Oral  ?SpO2: 97%  ?Weight: 139 lb 12.8 oz (63.4 kg)  ?Height: 5' 10.5" (1.791 m)  ? ?Gen: Pleasant, thin elderly woman, in no distress,  normal affect ? ?ENT: No lesions,  mouth clear,   oropharynx clear, no postnasal drip ? ?Neck: No JVD, no stridor ? ?Lungs: No use of accessory muscles, no crackles or wheezing on normal respiration, no wheeze on forced expiration ? ?Cardiovascular: RRR, heart sounds normal, no murmur or gallops ? ?Musculoskeletal: No deformities, no cyanosis or clubbing ? ?Neuro: alert, awake, non focal ? ?Skin: Warm, no lesions or rash ? ?   ?Assessment & Plan:  ?Mass of middle lobe of right lung ?Slowly enlarging right middle lobe mass.  We have been conservative to date, following with serial imaging.  A PET scan 04/2021 did not show any metastatic disease although there was a hypermetabolic area at C16 that had no correlate on MRI T-spine.  She is now starting to have cough and some mid chest discomfort.  While this could be due to other causes (plan to treat GERD) may be related to the mass lesion, finally causing symptoms.  If so she is interested in trying to treat.  Discussed with her the possibility of radiation/SBRT either with or without a tissue diagnosis by navigational bronchoscopy.  I will review her case with radiation oncology.  Plan to obtain a repeat PET scan to make sure this remains isolated in the mid chest.  She will follow-up with me to review, plan next steps. ? ?Time spent 40 minutes ? ?Baltazar Apo, MD, PhD ?10/29/2021, 9:28 AM ?[facility] ?[phone] or if no answer before 7:00PM call [phone] ?For any  issues after 7:00PM please call eLink [phone] ? ?

## 2021-10-29 NOTE — Addendum Note (Signed)
Addended by: Gavin Potters R on: 10/29/2021 09:58 AM ? ? Modules accepted: Orders ? ?

## 2021-11-04 ENCOUNTER — Telehealth: Payer: Self-pay | Admitting: Emergency Medicine

## 2021-11-04 NOTE — Telephone Encounter (Signed)
I have left 2 detailed messages for the patient her appt is 11/16/2021 arrive at 11:30 at [facility] no eat or drink after midnight or 6 hrs prior to the test no gum no candy water is ok to have ?

## 2021-11-16 ENCOUNTER — Encounter (HOSPITAL_COMMUNITY)
Admission: RE | Admit: 2021-11-16 | Discharge: 2021-11-16 | Disposition: A | Discharge status: Home / Self Care | Payer: Medicare Other | Source: Ambulatory Visit | Attending: Emergency Medicine | Admitting: Emergency Medicine

## 2021-11-16 ENCOUNTER — Other Ambulatory Visit: Payer: Self-pay

## 2021-11-16 DIAGNOSIS — I2510 Atherosclerotic heart disease of native coronary artery without angina pectoris: Secondary | ICD-10-CM | POA: Diagnosis not present

## 2021-11-16 DIAGNOSIS — R918 Other nonspecific abnormal finding of lung field: Secondary | ICD-10-CM | POA: Diagnosis not present

## 2021-11-16 DIAGNOSIS — I700 Atherosclerosis of aorta: Secondary | ICD-10-CM | POA: Insufficient documentation

## 2021-11-16 DIAGNOSIS — J90 Pleural effusion, not elsewhere classified: Secondary | ICD-10-CM | POA: Insufficient documentation

## 2021-11-16 LAB — GLUCOSE, CAPILLARY: Glucose-Capillary: 104 mg/dL — ABNORMAL HIGH (ref 70–99)

## 2021-11-16 MED ORDER — FLUDEOXYGLUCOSE F - 18 (FDG) INJECTION
6.8700 | Freq: Once | INTRAVENOUS | Status: AC
  Administered 2021-11-16: 6.87 via INTRAVENOUS

## 2021-11-17 NOTE — Telephone Encounter (Signed)
Patient completed her PET scan on 11/16/2021, but patient reviewed her AVS and saw "Dr. Lamonte Flowers will discuss your case in the thoracic oncology conference.  In particular we will discuss possible treatment with radiation oncology ?Please start omeprazole 20 mg once daily.  Take this medication 1 hour around food." ? ?Patient did not know about taking omeprazole and it was not called in. Patient would like to know what to do. Should she be taking in now or wait until 11/25/2021 when she sees Dr. Lamonte Flowers. ? ?Uses [facility]. ? ?Please advise. ?

## 2021-11-17 NOTE — Telephone Encounter (Signed)
ATC patient.  LM to call back. 

## 2021-11-23 ENCOUNTER — Encounter (HOSPITAL_COMMUNITY): Payer: Self-pay

## 2021-11-23 ENCOUNTER — Emergency Department (HOSPITAL_COMMUNITY)
Admission: EM | Admit: 2021-11-23 | Discharge: 2021-11-23 | Disposition: A | Discharge status: Home / Self Care | Payer: Medicare Other | Attending: Emergency Medicine | Admitting: Emergency Medicine

## 2021-11-23 ENCOUNTER — Emergency Department (HOSPITAL_COMMUNITY): Payer: Medicare Other

## 2021-11-23 DIAGNOSIS — R918 Other nonspecific abnormal finding of lung field: Secondary | ICD-10-CM | POA: Insufficient documentation

## 2021-11-23 DIAGNOSIS — Z91040 Latex allergy status: Secondary | ICD-10-CM | POA: Diagnosis not present

## 2021-11-23 DIAGNOSIS — R0602 Shortness of breath: Secondary | ICD-10-CM | POA: Insufficient documentation

## 2021-11-23 DIAGNOSIS — I10 Essential (primary) hypertension: Secondary | ICD-10-CM | POA: Diagnosis not present

## 2021-11-23 DIAGNOSIS — Z7901 Long term (current) use of anticoagulants: Secondary | ICD-10-CM | POA: Insufficient documentation

## 2021-11-23 DIAGNOSIS — I4891 Unspecified atrial fibrillation: Secondary | ICD-10-CM | POA: Insufficient documentation

## 2021-11-23 DIAGNOSIS — Z79899 Other long term (current) drug therapy: Secondary | ICD-10-CM | POA: Diagnosis not present

## 2021-11-23 DIAGNOSIS — R0781 Pleurodynia: Secondary | ICD-10-CM | POA: Diagnosis present

## 2021-11-23 DIAGNOSIS — E860 Dehydration: Secondary | ICD-10-CM | POA: Diagnosis not present

## 2021-11-23 DIAGNOSIS — D751 Secondary polycythemia: Secondary | ICD-10-CM | POA: Insufficient documentation

## 2021-11-23 DIAGNOSIS — R079 Chest pain, unspecified: Secondary | ICD-10-CM

## 2021-11-23 DIAGNOSIS — R42 Dizziness and giddiness: Secondary | ICD-10-CM

## 2021-11-23 LAB — CBC
HCT: 51.2 % — ABNORMAL HIGH (ref 36.0–46.0)
Hemoglobin: 17.4 g/dL — ABNORMAL HIGH (ref 12.0–15.0)
MCH: 35.7 pg — ABNORMAL HIGH (ref 26.0–34.0)
MCHC: 34 g/dL (ref 30.0–36.0)
MCV: 104.9 fL — ABNORMAL HIGH (ref 80.0–100.0)
Platelets: 433 10*3/uL — ABNORMAL HIGH (ref 150–400)
RBC: 4.88 MIL/uL (ref 3.87–5.11)
RDW: 13.6 % (ref 11.5–15.5)
WBC: 8.3 10*3/uL (ref 4.0–10.5)
nRBC: 0 % (ref 0.0–0.2)

## 2021-11-23 LAB — COMPREHENSIVE METABOLIC PANEL
ALT: 13 U/L (ref 0–44)
AST: 20 U/L (ref 15–41)
Albumin: 3.6 g/dL (ref 3.5–5.0)
Alkaline Phosphatase: 63 U/L (ref 38–126)
Anion gap: 8 (ref 5–15)
BUN: 22 mg/dL (ref 8–23)
CO2: 28 mmol/L (ref 22–32)
Calcium: 9.3 mg/dL (ref 8.9–10.3)
Chloride: 100 mmol/L (ref 98–111)
Creatinine, Ser: 0.82 mg/dL (ref 0.44–1.00)
GFR, Estimated: >60 mL/min (ref >60)
Glucose, Bld: 105 mg/dL — ABNORMAL HIGH (ref 70–99)
Potassium: 4.1 mmol/L (ref 3.5–5.1)
Sodium: 136 mmol/L (ref 135–145)
Total Bilirubin: 0.9 mg/dL (ref 0.3–1.2)
Total Protein: 6.6 g/dL (ref 6.5–8.1)

## 2021-11-23 LAB — I-STAT CHEM 8, ED
BUN: 24 mg/dL — ABNORMAL HIGH (ref 8–23)
Calcium, Ion: 1.18 mmol/L (ref 1.15–1.40)
Chloride: 100 mmol/L (ref 98–111)
Creatinine, Ser: 0.8 mg/dL (ref 0.44–1.00)
Glucose, Bld: 100 mg/dL — ABNORMAL HIGH (ref 70–99)
HCT: 53 % — ABNORMAL HIGH (ref 36.0–46.0)
Hemoglobin: 18 g/dL — ABNORMAL HIGH (ref 12.0–15.0)
Potassium: 4.1 mmol/L (ref 3.5–5.1)
Sodium: 138 mmol/L (ref 135–145)
TCO2: 30 mmol/L (ref 22–32)

## 2021-11-23 LAB — CBG MONITORING, ED: Glucose-Capillary: 105 mg/dL — ABNORMAL HIGH (ref 70–99)

## 2021-11-23 LAB — TROPONIN I (HIGH SENSITIVITY)
Troponin I (High Sensitivity): 7 ng/L (ref <18)
Troponin I (High Sensitivity): 7 ng/L (ref <18)

## 2021-11-23 LAB — LIPASE, BLOOD: Lipase: 28 U/L (ref 11–51)

## 2021-11-23 MED ORDER — SODIUM CHLORIDE 0.9 % IV SOLN
INTRAVENOUS | Status: DC
Start: 2021-11-23 — End: 2021-11-23

## 2021-11-23 MED ORDER — SODIUM CHLORIDE (PF) 0.9 % IJ SOLN
INTRAMUSCULAR | Status: AC
  Filled 2021-11-23: qty 50

## 2021-11-23 MED ORDER — IOHEXOL 350 MG/ML SOLN
100.0000 mL | Freq: Once | INTRAVENOUS | Status: AC | PRN
  Administered 2021-11-23: 75 mL via INTRAVENOUS

## 2021-11-23 MED ORDER — ACETAMINOPHEN 325 MG PO TABS
650.0000 mg | ORAL_TABLET | Freq: Once | ORAL | Status: AC
  Administered 2021-11-23: 650 mg via ORAL
  Filled 2021-11-23: qty 2

## 2021-11-23 NOTE — ED Notes (Signed)
Pt requesting something to eat and drink.  Provided a [place] sandwich, apple sauce, and water.  ?

## 2021-11-23 NOTE — Discharge Instructions (Signed)
Please drink a boost/ensure when you first wake up and drink a full glass of water. Follow up with your primary care and other outpatient specialists.  ?Specifically I would like you to see your cardiologist. ?

## 2021-11-23 NOTE — ED Notes (Signed)
Pt's daughter and Chauncey [place], Jennafer Gladue, called and was provided an update w/ the Pt's permission.  She can be reached at [phone]. ?

## 2021-11-23 NOTE — ED Notes (Signed)
Patient transported to X-ray 

## 2021-11-23 NOTE — ED Triage Notes (Signed)
Per EMS, Pt, from home/[place], c/o increasing SOB and "passing out" x 3 days and bilateral rib cage pain and mid back pain x 2-3 weeks.  Pain score 5/10. ? ?Pt reports recent PET scan and sts she is awaiting a diagnosis of Lung CA.   ?

## 2021-11-23 NOTE — ED Notes (Signed)
Patient transported to CT 

## 2021-11-23 NOTE — ED Provider Notes (Signed)
?[facility] DEPT ?Provider Note ? ? ?CSN: [id number] ?Arrival date & time: 11/23/21  1638 ? ?  ? ?History ? ?Chief Complaint  ?Patient presents with  ? Shortness of Breath  ? Chest Pain  ? ? ?[name] is a 88 y.o. female. ? ? ?Shortness of Breath ?Associated symptoms: chest pain   ?Chest Pain ?Associated symptoms: shortness of breath   ? ?Patient is an 88 year old female with past medical history significant for A-fib on DOAC, HTN, HLD, myocardial infarction, polycythemia vera, hypercoagulable state, mass of right middle lobe of lung found to be hypermetabolic on PET scan ? ?Patient presents to the emergency room today with complaints of rib pain around her right chest and sternum also complains of shortness of breath.  Is also having some back pain which she describes as radiating to her chest.  Seems that she has had this pain for approximately 1 month but somewhat worse yesterday evening and this morning.  She states that this morning her pain was worse and she also felt short of breath she states that she has been coughing over the past month without hemoptysis.  Denies any lightheadedness up until yesterday and this morning.  She states that she felt like she was going to pass out this morning she states that her feeling as if she was going to pass out was not resulted pain but rather just feeling very weak and lightheaded. ? ?No nausea or vomiting.  No vertigo.  No numbness or weakness no slurred speech or confusion.  Denies any arm or leg pain. ? ? ?  ? ?Home Medications ?Prior to Admission medications   ?Medication Sig Start Date End Date Taking? Authorizing Provider  ?Ascorbic Acid (VITAMIN C PO) Take 3,000 mg by mouth daily.     [provider]  ?Cholecalciferol (VITAMIN D3) 50 MCG (2000 UT) TABS Take 2,000 Units by mouth daily.    [provider]  ?Coenzyme Q10 (CO Q-10) 50 MG CAPS Take 50 mg by mouth daily.  02/02/10   [provider]  ?ELIQUIS  5 MG TABS tablet TAKE 1 TABLET (5 MG TOTAL) BY MOUTH 2 (TWO) TIMES DAILY. 04/16/21   Josue Hector, MD  ?flecainide (TAMBOCOR) 50 MG tablet Take 1 tablet (50 mg total) by mouth 2 (two) times daily. 07/29/21   Josue Hector, MD  ?furosemide (LASIX) 20 MG tablet 1 tablet 08/04/21   [provider]  ?hydroxyurea (HYDREA) 500 MG capsule Take 500 mg by mouth See admin instructions. Take 500 mg by mouth twice daily Monday-Friday and once daily on Saturday and Sunday 04/03/18   [provider]  ?MAGNESIUM OXIDE 400 PO Take 400 mg by mouth daily.     [provider]  ?Multiple Vitamins-Minerals (MULTIVITAMIN ADULT PO) Take 1 tablet by mouth daily.  11/01/11   [provider]  ?Omega-3 Fatty Acids (OMEGA-3 EPA FISH OIL PO) Take 1 capsule by mouth daily. EPA 300 mg ?DHA 200 mg    [provider]  ?OVER THE COUNTER MEDICATION Take 300 mg by mouth daily. Green tea    [provider]  ?   ? ?Allergies    ?Bee venom, Sulfa antibiotics, Sulfamethoxazole, Hydrocodone-acetaminophen, Meperidine, Morphine, Oxycodone hcl, Oxycodone-acetaminophen, Anesthesia s-i-40 [propofol], Caffeine, Hydrocodone, Lactose, Oxycodone-aspirin, Propoxyphene, Soy allergy, Codeine, Latex, and Valdecoxib   ? ?Review of Systems   ?Review of Systems  ?Respiratory:  Positive for shortness of breath.   ?Cardiovascular:  Positive for chest pain.  ? ?  Physical Exam ?Updated Vital Signs ?BP 108/75   Pulse 71   Temp 97.8 ?F (36.6 ?C) (Oral)   Resp (!) 25   SpO2 96%  ?Physical Exam ?Vitals and nursing note reviewed.  ?Constitutional:   ?   General: She is not in acute distress. ?   Comments: Thin/cachectic 88 year old female, pleasant, able answer questions but the volume  ?HENT:  ?   Head: Normocephalic and atraumatic.  ?   Nose: Nose normal.  ?Eyes:  ?   General: No scleral icterus. ?Cardiovascular:  ?   Rate and Rhythm: Normal rate and regular rhythm.  ?   Pulses: Normal pulses.  ?   Heart sounds: Normal  heart sounds.  ?Pulmonary:  ?   Effort: Pulmonary effort is normal. No respiratory distress.  ?   Breath sounds: No wheezing.  ?   Comments: Crackles right lower lobe.  Not tachypneic, speaking in full sentences, lung sounds generally clear apart from crackles. ?Abdominal:  ?   Palpations: Abdomen is soft.  ?   Tenderness: There is no abdominal tenderness.  ?Musculoskeletal:  ?   Cervical back: Normal range of motion.  ?   Right lower leg: No edema.  ?   Left lower leg: No edema.  ?Skin: ?   General: Skin is warm and dry.  ?   Capillary Refill: Capillary refill takes less than 2 seconds.  ?Neurological:  ?   Mental Status: She is alert. Mental status is at baseline.  ?Psychiatric:     ?   Mood and Affect: Mood normal.     ?   Behavior: Behavior normal.  ? ? ?ED Results / Procedures / Treatments   ?Labs ?(all labs ordered are listed, but only abnormal results are displayed) ?Labs Reviewed  ?CBC - Abnormal; Notable for the following components:  ?    Result Value  ? Hemoglobin 17.4 (*)   ? HCT 51.2 (*)   ? MCV 104.9 (*)   ? MCH 35.7 (*)   ? Platelets 433 (*)   ? All other components within normal limits  ?COMPREHENSIVE METABOLIC PANEL - Abnormal; Notable for the following components:  ? Glucose, Bld 105 (*)   ? All other components within normal limits  ?I-STAT CHEM 8, ED - Abnormal; Notable for the following components:  ? BUN 24 (*)   ? Glucose, Bld 100 (*)   ? Hemoglobin 18.0 (*)   ? HCT 53.0 (*)   ? All other components within normal limits  ?CBG MONITORING, ED - Abnormal; Notable for the following components:  ? Glucose-Capillary 105 (*)   ? All other components within normal limits  ?LIPASE, BLOOD  ?TROPONIN I (HIGH SENSITIVITY)  ?TROPONIN I (HIGH SENSITIVITY)  ? ? ?EKG ?EKG Interpretation ? ?Date/Time:  Monday November 23 2021 08:55:54 EDT ?Ventricular Rate:  79 ?PR Interval:    ?QRS Duration: 106 ?QT Interval:  402 ?QTC Calculation: 443 ?R Axis:   -15 ?Text Interpretation: new  Atrial fibrillation Anterior  infarct, old Confirmed by Blanchie Dessert [phone]) on 11/23/2021 9:26:41 AM ? ?Radiology ?No results found. ? ?Procedures ?Procedures  ? ? ?Medications Ordered in ED ?Medications  ?acetaminophen (TYLENOL) tablet 650 mg (650 mg Oral Given 11/23/21 0900)  ?sodium chloride (PF) 0.9 % injection (  Given by Other 11/23/21 1124)  ?iohexol (OMNIPAQUE) 350 MG/ML injection 100 mL (75 mLs Intravenous Contrast Given 11/23/21 1105)  ? ? ?ED Course/ Medical Decision Making/ A&P ?Clinical Course as of 11/25/21 1406  ?Mon Nov 23, 2021  ?1034 1 month of pain. Somewhat worse this AM. LH last night ? ? ?Coughing for 1 month - no hemoptysis.  [WF]  ?1047 PET scan 11/16/21 ? ?IMPRESSION: ?1. Right middle lobe mass with hypermetabolic pleural thickening and ?small right pleural effusion, findings most indicative of stage IV ?lung cancer. ?2. Aortic atherosclerosis (ICD10-I70.0). Coronary artery ?calcification. ?  [WF]  ?1212 Vital signs without orthostasis.  Patient states that she feels much improved. [WF]  ?  ?Clinical Course User Index ?[WF] Tedd Sias, [place]  ? ?                        ?Medical Decision Making ?Amount and/or Complexity of Data Reviewed ?Labs: ordered. ?Radiology: ordered. ? ?Risk ?OTC drugs. ?Prescription drug management. ? ? ?This patient presents to the ED for concern of chest pain and shortness of breath, this involves a number of treatment options, and is a complaint that carries with it a high risk of complications and morbidity.  The differential diagnosis includes  ? ?The causes for shortness of breath include but are not limited to ?Cardiac (AHF, pericardial effusion and tamponade, arrhythmias, ischemia, etc) ?Respiratory (COPD, asthma, pneumonia, pneumothorax, primary pulmonary hypertension, PE/VQ mismatch) ?Hematological (anemia) ?Neuromuscular (ALS, Guillain-Barr?, etc)  ? ? ?Co morbidities: ?Discussed in HPI ? ? ?Brief History: ? ?Patient is an 88 year old female with past medical history significant for  A-fib on DOAC, HTN, HLD, myocardial infarction, polycythemia vera, hypercoagulable state, mass of right middle lobe of lung found to be hypermetabolic on PET scan ? ?Patient presents to the emergency room today with

## 2021-11-23 NOTE — ED Notes (Addendum)
Patient is going to X-ray ?

## 2021-11-23 NOTE — ED Notes (Signed)
Pt made aware she is up for discharge and is calling a ride.  Sts ride cannot be here until 1400. ?

## 2021-11-23 NOTE — ED Notes (Signed)
As I observe the patient doing the orthostatic vitals and while checking the pulse and oxygen as we were ambulating. The patient stayed above the 80's for pulse (83) and above the 90's (96).  Patient was also able to participate in the orthostatic without any complain or pain. Notify the Nurse ?   ?

## 2021-11-25 ENCOUNTER — Ambulatory Visit (INDEPENDENT_AMBULATORY_CARE_PROVIDER_SITE_OTHER): Payer: Medicare Other | Admitting: Emergency Medicine

## 2021-11-25 ENCOUNTER — Encounter: Payer: Self-pay | Admitting: Emergency Medicine

## 2021-11-25 ENCOUNTER — Ambulatory Visit: Payer: Medicare Other | Admitting: Emergency Medicine

## 2021-11-25 DIAGNOSIS — R918 Other nonspecific abnormal finding of lung field: Secondary | ICD-10-CM

## 2021-11-25 NOTE — Progress Notes (Signed)
? ?Subjective:  ? ? Patient ID: [name], female    DOB: [birth year], 88 y.o.   MRN: [id number] ? ?HPI ? ?ROV 10/29/21 --88 year old woman with a history of tobacco use and breast cancer, polycythemia vera, hypertension, CAD, A-fib.  She has a suspicious right mid lobe opacity that we have been following with serial imaging conservatively, hypermetabolic on PET scan September 2022. She has noticed a dry cough, throat irritation, bothersome when talking, in the am and later at night. No sputum or hemoptysis. She feels a heaviness in her right mid chest - progressing over the last several months. She denies any rhinitis, she does have some GERD sx.  ? ?CT chest 10/21/2021 reviewed shows that the right lower lobe mass is mildly enlarged has some medial extension into the right hilum 4.6 x 2.5 cm.  There is also increased nodularity around the inferior aspect of the right major fissure.  No mediastinal or hilar adenopathy.  No effusion ? ? ?ROV 11/25/21 --pleasant 88 year old woman, former smoker with history of breast cancer.  We have been following a slowly enlarging right lower lobe mass.  She is starting to evolve symptoms including cough and some mid chest discomfort..  She was in the ED on 11/23/2021 and had a CT as below.  ? ?PET scan 11/16/2021 reviewed by me shows the right middle lobe mass is hypermetabolic with some associated pleural thickening and a small right pleural effusion ? ?Chest performed CT-PA/3/23 reviewed by me showed no evidence of pulmonary embolism right middle lobe mass 2.8 x 4.5 cm with a small right pleural effusion, slightly larger than 11/16/2021.  There are some pleural and subpleural nodularity in the right hemithorax ? ? ?Review of Systems ?As per HPI ? ?   ?Objective:  ? Physical Exam ?Vitals:  ? 11/25/21 1441  ?BP: 134/74  ?Pulse: 69  ?Temp: 98.7 ?F (37.1 ?C)  ?TempSrc: Oral  ?SpO2: 96%  ?Weight: 143 lb (64.9 kg)  ?Height: 5\' 8"  (1.727 m)  ? ?Gen: Pleasant, thin elderly woman, in no  distress,  normal affect ? ?ENT: No lesions,  mouth clear,  oropharynx clear, no postnasal drip ? ?Neck: No JVD, no stridor ? ?Lungs: No use of accessory muscles, no crackles or wheezing on normal respiration, no wheeze on forced expiration ? ?Cardiovascular: RRR, heart sounds normal, no murmur or gallops ? ?Musculoskeletal: No deformities, no cyanosis or clubbing ? ?Neuro: alert, awake, non focal ? ?Skin: Warm, no lesions or rash ? ?   ?Assessment & Plan:  ?Mass of middle lobe of right lung ?Right middle lobe mass is clearly now causing symptoms, pleuritic chest pain, cough, possibly some dyspnea.  There may be some pleural nodularity present.  Clearly there is a small effusion.  I do not think it is big enough to tap at this juncture.  I talked her today about her philosophy for her care, whether she wants a tissue diagnosis and the opportunity to discuss options with oncology, radiation oncology or whether she would like to concentrate instead on symptom management, palliation.  She needs to talk to her family about this.  She is going to get back to me and let me know if she would like for me to arrange for bronchoscopy versus arrange for a more palliative approach. ? ? ?We reviewed your CT scan of the chest and PET scan today.  Your right middle lobe mass has been slowly enlarging over time.  There is hypermetabolism on the PET scan consistent with  a primary lung cancer.  There is some inflammatory fluid around the right lung as well as some mild PET scan activity around the edge of the lung. ?We talked today about ways to proceed including possible conservative care, symptom based care without any intervention or direct cancer therapy.  Alternatively we did discuss bronchoscopy under general anesthesia to allow for biopsies of the right middle lobe mass and discussion about possible cancer therapies that may help with symptoms.  We agreed that you would speak with family regarding the options, your  wishes. ?Please contact our office to let us know which strategy you would like to pursue.  If you do decide to undergo bronchoscopy then we will work on setting this up.  If we decide to pursue symptom-based treatment then we will help arrange this as well. ?Follow with Dr Lamonte Sakai in 1 month or next available.  ? ?Time spent 41 minutes ? ?Baltazar Apo, MD, PhD ?11/25/2021, 5:14 PM ?[facility] ?[phone] or if no answer before 7:00PM call [phone] ?For any issues after 7:00PM please call eLink [phone] ? ?

## 2021-11-25 NOTE — Assessment & Plan Note (Signed)
Right middle lobe mass is clearly now causing symptoms, pleuritic chest pain, cough, possibly some dyspnea.  There may be some pleural nodularity present.  Clearly there is a small effusion.  I do not think it is big enough to tap at this juncture.  I talked her today about her philosophy for her care, whether she wants a tissue diagnosis and the opportunity to discuss options with oncology, radiation oncology or whether she would like to concentrate instead on symptom management, palliation.  She needs to talk to her family about this.  She is going to get back to me and let me know if she would like for me to arrange for bronchoscopy versus arrange for a more palliative approach. ? ? ?We reviewed your CT scan of the chest and PET scan today.  Your right middle lobe mass has been slowly enlarging over time.  There is hypermetabolism on the PET scan consistent with a primary lung cancer.  There is some inflammatory fluid around the right lung as well as some mild PET scan activity around the edge of the lung. ?We talked today about ways to proceed including possible conservative care, symptom based care without any intervention or direct cancer therapy.  Alternatively we did discuss bronchoscopy under general anesthesia to allow for biopsies of the right middle lobe mass and discussion about possible cancer therapies that may help with symptoms.  We agreed that you would speak with family regarding the options, your wishes. ?Please contact our office to let us know which strategy you would like to pursue.  If you do decide to undergo bronchoscopy then we will work on setting this up.  If we decide to pursue symptom-based treatment then we will help arrange this as well. ?Follow with Dr Lamonte Sakai in 1 month or next available.  ?

## 2021-11-25 NOTE — Patient Instructions (Signed)
We reviewed your CT scan of the chest and PET scan today.  Your right middle lobe mass has been slowly enlarging over time.  There is hypermetabolism on the PET scan consistent with a primary lung cancer.  There is some inflammatory fluid around the right lung as well as some mild PET scan activity around the edge of the lung. ?We talked today about ways to proceed including possible conservative care, symptom based care without any intervention or direct cancer therapy.  Alternatively we did discuss bronchoscopy under general anesthesia to allow for biopsies of the right middle lobe mass and discussion about possible cancer therapies that may help with symptoms.  We agreed that you would speak with family regarding the options, your wishes. ?Please contact our office to let us know which strategy you would like to pursue.  If you do decide to undergo bronchoscopy then we will work on setting this up.  If we decide to pursue symptom-based treatment then we will help arrange this as well. ?Follow with Dr Lamonte Sakai in 1 month or next available.  ? ?

## 2021-11-25 NOTE — H&P (View-Only) (Signed)
? ?Subjective:  ? ? Patient ID: [name], female    DOB: 1929-11-15, 88 y.o.   MRN: [id number] ? ?HPI ? ?ROV 10/29/21 --88 year old woman with a history of tobacco use and breast cancer, polycythemia vera, hypertension, CAD, A-fib.  She has a suspicious right mid lobe opacity that we have been following with serial imaging conservatively, hypermetabolic on PET scan September 2022. She has noticed a dry cough, throat irritation, bothersome when talking, in the am and later at night. No sputum or hemoptysis. She feels a heaviness in her right mid chest - progressing over the last several months. She denies any rhinitis, she does have some GERD sx.  ? ?CT chest 10/21/2021 reviewed shows that the right lower lobe mass is mildly enlarged has some medial extension into the right hilum 4.6 x 2.5 cm.  There is also increased nodularity around the inferior aspect of the right major fissure.  No mediastinal or hilar adenopathy.  No effusion ? ? ?ROV 11/25/21 --pleasant 88 year old woman, former smoker with history of breast cancer.  We have been following a slowly enlarging right lower lobe mass.  She is starting to evolve symptoms including cough and some mid chest discomfort..  She was in the ED on 11/23/2021 and had a CT as below.  ? ?PET scan 11/16/2021 reviewed by me shows the right middle lobe mass is hypermetabolic with some associated pleural thickening and a small right pleural effusion ? ?Chest performed CT-PA/3/23 reviewed by me showed no evidence of pulmonary embolism right middle lobe mass 2.8 x 4.5 cm with a small right pleural effusion, slightly larger than 11/16/2021.  There are some pleural and subpleural nodularity in the right hemithorax ? ? ?Review of Systems ?As per HPI ? ?   ?Objective:  ? Physical Exam ?Vitals:  ? 11/25/21 1441  ?BP: 134/74  ?Pulse: 69  ?Temp: 98.7 ?F (37.1 ?C)  ?TempSrc: Oral  ?SpO2: 96%  ?Weight: 143 lb (64.9 kg)  ?Height: 5\' 8"  (1.727 m)  ? ?Gen: Pleasant, thin elderly woman, in no  distress,  normal affect ? ?ENT: No lesions,  mouth clear,  oropharynx clear, no postnasal drip ? ?Neck: No JVD, no stridor ? ?Lungs: No use of accessory muscles, no crackles or wheezing on normal respiration, no wheeze on forced expiration ? ?Cardiovascular: RRR, heart sounds normal, no murmur or gallops ? ?Musculoskeletal: No deformities, no cyanosis or clubbing ? ?Neuro: alert, awake, non focal ? ?Skin: Warm, no lesions or rash ? ?   ?Assessment & Plan:  ?Mass of middle lobe of right lung ?Right middle lobe mass is clearly now causing symptoms, pleuritic chest pain, cough, possibly some dyspnea.  There may be some pleural nodularity present.  Clearly there is a small effusion.  I do not think it is big enough to tap at this juncture.  I talked her today about her philosophy for her care, whether she wants a tissue diagnosis and the opportunity to discuss options with oncology, radiation oncology or whether she would like to concentrate instead on symptom management, palliation.  She needs to talk to her family about this.  She is going to get back to me and let me know if she would like for me to arrange for bronchoscopy versus arrange for a more palliative approach. ? ? ?We reviewed your CT scan of the chest and PET scan today.  Your right middle lobe mass has been slowly enlarging over time.  There is hypermetabolism on the PET scan consistent with  a primary lung cancer.  There is some inflammatory fluid around the right lung as well as some mild PET scan activity around the edge of the lung. ?We talked today about ways to proceed including possible conservative care, symptom based care without any intervention or direct cancer therapy.  Alternatively we did discuss bronchoscopy under general anesthesia to allow for biopsies of the right middle lobe mass and discussion about possible cancer therapies that may help with symptoms.  We agreed that you would speak with family regarding the options, your  wishes. ?Please contact our office to let us know which strategy you would like to pursue.  If you do decide to undergo bronchoscopy then we will work on setting this up.  If we decide to pursue symptom-based treatment then we will help arrange this as well. ?Follow with Dr Lamonte Sakai in 1 month or next available.  ? ?Time spent 41 minutes ? ?Baltazar Apo, MD, PhD ?11/25/2021, 5:14 PM ?[facility] ?[phone] or if no answer before 7:00PM call [phone] ?For any issues after 7:00PM please call eLink [phone] ? ?

## 2021-11-27 ENCOUNTER — Telehealth: Payer: Self-pay | Admitting: Emergency Medicine

## 2021-11-27 DIAGNOSIS — R918 Other nonspecific abnormal finding of lung field: Secondary | ICD-10-CM

## 2021-11-27 NOTE — Addendum Note (Signed)
Addended by: Collene Gobble on: 11/27/2021 05:18 PM ? ? Modules accepted: Orders ? ?

## 2021-11-27 NOTE — Telephone Encounter (Signed)
Please let her know that I put in a request to the [facility] to get the bronchoscopy scheduled for 12/21/2021.  They should be hearing next week ?

## 2021-11-27 NOTE — Telephone Encounter (Signed)
Spoke to patient's daughter, Valerie(DPR). ?She stated that patient would like to proceed with bronch. She is questioning if it could be performed on  12/21/2021? ? ?Dr. Lamonte Sakai, please advise. Thanks ?

## 2021-11-30 ENCOUNTER — Encounter: Payer: Self-pay | Admitting: Emergency Medicine

## 2021-11-30 ENCOUNTER — Telehealth: Payer: Self-pay | Admitting: Emergency Medicine

## 2021-11-30 NOTE — Telephone Encounter (Signed)
Patient is returning phone call. Patient phone number is [phone]. ?

## 2021-11-30 NOTE — Telephone Encounter (Signed)
I had called pt earlier and left her vm to call me back for appt info.  Just called & eft her another vm.   ?

## 2021-11-30 NOTE — Telephone Encounter (Signed)
Spoke to pt's dtr [name] and gave her bronch appt info.  She has questions about medications  She states mom is in a lot of pain.   ?

## 2021-11-30 NOTE — Telephone Encounter (Signed)
Called patient's daughter but she did not answer. Left message to call back. Per patient's chart, RB sent the bronch request late on Friday 11/27/21 so the PCCs have not had time to work on it yet.  ?

## 2021-11-30 NOTE — Telephone Encounter (Signed)
I called and spoke with the pt  ?She is asking if Dr Lamonte Sakai can prescribe something to help with pain  ?She states she has been having pain "going through chest"  ?This isn't new and she has been taking tylenol with no relief  ?She states that any sore of opioid causes her to have nausea and she does not want opioid  ?Dr Lamonte Sakai, please advise. I have updated her pharmacy, thanks! ? ?Allergies  ?Allergen Reactions  ? Bee Venom Anaphylaxis  ? Sulfa Antibiotics Anaphylaxis  ? Sulfamethoxazole Anaphylaxis  ? Hydrocodone-Acetaminophen Nausea Only  ? Meperidine Nausea And Vomiting  ? Morphine Nausea And Vomiting  ? Oxycodone Hcl Nausea Only  ? Oxycodone-Acetaminophen Nausea Only  ? Anesthesia S-I-40 [Propofol] Nausea Only  ? Caffeine Other (See Comments)  ? Hydrocodone Nausea And Vomiting  ? Lactose Nausea And Vomiting  ? Oxycodone-Aspirin Nausea And Vomiting  ? Propoxyphene   ? Soy Allergy Other (See Comments)  ? Codeine Nausea And Vomiting  ? Latex Rash  ? Valdecoxib Nausea Only  ? ? ?

## 2021-12-01 NOTE — Telephone Encounter (Signed)
Called and spoke with pt letting her know the info from [place] about stating that she could alternate ibuprofen with tylenol and pt said that she has been doing okay with tylenol and will stick with that.while speaking with pt, pt said that she had questions about upcoming bronch and I answered all questions that she had. Nothing further needed. ?

## 2021-12-01 NOTE — Telephone Encounter (Signed)
We could try ibuprofen 600mg  q12 to q8h. Could consider Ultram but I know she does not tolerate narcotics. I guess would alternate the tylenol and ibuprofen ?

## 2021-12-01 NOTE — Telephone Encounter (Signed)
Called and spoke with pt letting her know the info from [place] about stating that she could alternate ibuprofen with tylenol and pt said that she has been doing okay with tylenol and will stick with that.while speaking with pt, pt said that she had questions about upcoming bronch and I answered all questions that she had. Nothing further needed. ?

## 2021-12-01 NOTE — Telephone Encounter (Signed)
ATC patient's daughter but she did not answer. Left VM for her to call us back.  ?

## 2021-12-01 NOTE — Telephone Encounter (Signed)
Called [name] but she did not answer. Left message for her to call back.  ?

## 2021-12-02 ENCOUNTER — Other Ambulatory Visit: Payer: Self-pay | Admitting: Emergency Medicine

## 2021-12-02 ENCOUNTER — Telehealth: Payer: Self-pay | Admitting: Emergency Medicine

## 2021-12-02 MED ORDER — TRAMADOL HCL 50 MG PO TABS: 50.0000 mg | ORAL_TABLET | Freq: Four times a day (QID) | ORAL | 0 refills | Status: DC | PRN

## 2021-12-02 NOTE — Telephone Encounter (Signed)
Called and spoke with pt's daughter [name] letting her know that I did call and speak with pt yesterday about recommendations from [place] about meds she could take for the pain. [name] told me that pt does have dementia so she may seem like she is with it one minute but then the next minute, she cannot remember the conversation that she had just had with the other person. ? ? ? ?[name] said that pt called her this morning crying in pain. I discussed with [name] the recommendations that RB had suggested and she said that pt cannot take ibuprofen with her being on Eliquis. Pt has been taking tylenol which is helping but it is not lasting that long. ? ?[name] said that pt has taken both ultram and Toradol in the past without having any problems to either of those meds. She also said something about seeing if a fentanyl patch might be able to be prescribed for pt to get relief a little longer and also wanted to know if there was an extended release med similar to either ultram or Toradol that might be able to be prescribed so she could get relief longer. ? ? ?Dr. Lamonte Sakai, please advise on this for both pt and daughter [name]. ?

## 2021-12-02 NOTE — Telephone Encounter (Signed)
I am fine trying the ultram - will send. Can consider the fentanyl patch as well if we are unable to get control of her pain. Do we think she needs to be seen in office to establish a plan, check CXR and insure no changes that are behind progressive symptoms? If they can com in for an acute OV then I would support that.  ?

## 2021-12-02 NOTE — Telephone Encounter (Signed)
Looks like [name], called patient, and spoke with her.Patient daughter states her mom has dementia and she dont understand. She wants a call at [phone] because her mom is in really bad pain.Daughter state that she lives 8 hours away and if she has to leave her job she will be calling [facility] to report [facility] for the lack of care. [name] was informed that a nurse will return her call when they become free.  Routing to triage high priority. ?

## 2021-12-02 NOTE — Telephone Encounter (Signed)
Called and spoke with pt's daughter [name] letting her know the info per RB and she verbalized understanding. Stated to her that [facility] sent Rx for ultram to pharmacy for pt and she said she would call pt to have her go pick med up at pharmacy. Nothing further needed. ?

## 2021-12-04 ENCOUNTER — Telehealth: Payer: Self-pay | Admitting: Emergency Medicine

## 2021-12-04 NOTE — Telephone Encounter (Signed)
Thank you for letting me know

## 2021-12-04 NOTE — Telephone Encounter (Signed)
I called and spoke with the pt's daughter, [name]. She states that pt lost her info sheet for bronch. I have printed this and mailed to [facility] per her request. [name] wanted me to let Dr Lamonte Sakai know that pt has been taking the tramadol 50 mg 1 every 6 hours and having breakthrough pain. She has told the pt to go ahead and try taking 2 tablets and see if this helps since the rx written states take 1-2 every 6 prn. Pt told her that she was afraid to take 2 bc she does not want to become addicted to tramadol. Daughter reassured her that she would be okay to try taking two and she will try this today. Just an FYI.  ?

## 2021-12-07 ENCOUNTER — Telehealth: Payer: Self-pay | Admitting: Emergency Medicine

## 2021-12-07 NOTE — Telephone Encounter (Signed)
I'm sorry to hear she is in the hospital. Thank you for letting me know. Please have them contact me if we need to make any adjustments to our plan to perform bronchoscopy on May 1.  ?

## 2021-12-07 NOTE — Telephone Encounter (Signed)
Spoke with the pt's daughter  ?She is calling to let Dr Lamonte Sakai know that the pt has been admitted to [facility] (viewable to care everywhere) ?She called EMS after 5 days of constipation and severe pain  ?She is currently on Dilaudid and doing well with this for her pain  ?Sending to Dr Lamonte Sakai as [name] ?

## 2021-12-08 NOTE — Telephone Encounter (Signed)
Thank you :)

## 2021-12-08 NOTE — Telephone Encounter (Signed)
Called and spoke with patient's daughter Bonne Dolores to do a thora but they could not get to it due to the mass being in the way. Dilaudid is working great for her they are trying to get her off IV and to tablets so she can go home. Advised daughter to keep [place] updated and let us know if they need anything. She expressed understanding. Will forward to Dr. Lamonte Sakai as Juluis Rainier. ?

## 2021-12-11 ENCOUNTER — Telehealth: Payer: Self-pay | Admitting: Emergency Medicine

## 2021-12-11 NOTE — Telephone Encounter (Signed)
Called and spoke with patient's daughter [name] (DPR),provided recommendations per Dr. Lamonte Sakai. She asked the conversion of ultram to morphine and fentanyl to morphine.  She asked if we could start with a lower dose than 25 mcg because she is not used to getting narcotics and she is a small lady.  She is asking if a 12 mcg fentanyl patch can be prescribed to be changed every 3 days?  She also wants to make sure once the patch is placed that she is no longer getting the oral medications for pain.   ? ?Dr. Lamonte Sakai, please advise regarding patient getting a 12 mcg fentanyl patch instead of a 25 mcg patch.  Thank you. ?

## 2021-12-11 NOTE — Telephone Encounter (Signed)
Spoke with [name] (per DPR) who states pt is currently in a rehab center and is having pain. [name] states staff is not giving pt Tramadol around the clock because order is written as a PRN. [name] states pt is unable to request medication before experiencing severe pain r/t pt's poor memory. [name] was wondering if a fentanyl patch would be more effective at controlling pt's pain. Dr. Lamonte Sakai please advise.  ?

## 2021-12-11 NOTE — Telephone Encounter (Signed)
Yes, send order for fent patch 70mcg ?And send order to d/c her oral pain meds > ultram, dilaudid ?

## 2021-12-11 NOTE — Telephone Encounter (Signed)
There is risk that fentanyl could suppress drive to breathe, impair respiratory status. As long as we we watch closely, are aware of this risk, then I am ok ordering fentanyl patch 10mcg q3days ?

## 2021-12-14 NOTE — Telephone Encounter (Signed)
I called and spoke with the pt's daughter [name] and notified of Dr. Agustina Caroli response. She states no need to send anything else in. They have d/c'ed ultram and dilaudid. The NP taking care of the pt has started her on long acting tylenol arthritis and a lidocaine patch and this is helping. Will forward back to RB as FYI.  ?

## 2021-12-15 ENCOUNTER — Telehealth: Payer: Self-pay | Admitting: Emergency Medicine

## 2021-12-15 NOTE — Telephone Encounter (Signed)
Pt needs to hold Eliquis 2 days prior to procedure  ?I called and spoke with Gwyndolyn Saxon at [facility] and made him aware of this  ?He verbalized understanding and nothing further needed ?

## 2021-12-18 ENCOUNTER — Encounter (HOSPITAL_COMMUNITY): Payer: Self-pay | Admitting: Emergency Medicine

## 2021-12-18 NOTE — Progress Notes (Signed)
Patient resides at [facility]) [phone].  Information and Instructions for DOS were faxed to Nicole [facility]red at [phone]. ? ?PCP - Francine Graven, NP ?Hematology & Oncology - Dr Jacqulyn Ducking ?Cardiologist - Dr Jenkins Rouge ?Pulmonology - Dr Baltazar Apo ?Internal Med - Dr [name] ? ?Chest x-ray - 11/23/21 (2V) ?EKG - 11/23/21 ?Stress Test - 02/26/20 ?ECHO - 10/23/19 ?Cardiac Cath - 10/22/14 ? ?ICD Pacemaker/Loop - n/a ? ?Sleep Study -  n/a ?CPAP - none ? ?Blood Thinner Instructions:  Follow your surgeon's instructions on when to stop Eliquis prior to surgery.  Last dose will be 12/18/21 per MD. ? ?Anesthesia review: Yes ? ?STOP now taking any Aspirin (unless otherwise instructed by your surgeon), Aleve, Naproxen, Ibuprofen, Motrin, Advil, Goody's, BC's, all herbal medications, fish oil, and all vitamins.  ? ?Coronavirus Screening ?Covid test on DOS ?Does the patient have any of the following symptoms:  ?Cough yes/no: No ?Fever (>100.13F)  yes/no: No ?Runny nose yes/no: No ?Sore throat yes/no: No ?Difficulty breathing/shortness of breath  yes/no: No ?

## 2021-12-18 NOTE — Anesthesia Preprocedure Evaluation (Addendum)
Anesthesia Evaluation  ?Patient identified by MRN, date of birth, ID band ?Patient awake ? ? ? ?Reviewed: ?Allergy & Precautions, NPO status , Patient's Chart, lab work & pertinent test results ? ?History of Anesthesia Complications ?(+) PONV and history of anesthetic complications ? ?Airway ?Mallampati: II ? ?TM Distance: >3 FB ?Neck ROM: Full ? ? ? Dental ?no notable dental hx. ? ?  ?Pulmonary ?former smoker,  ?Right middle lobe mass ?  ? ?+ decreased breath sounds ? ? ? ? ? Cardiovascular ?hypertension, + Past MI  ?+ dysrhythmias (on Eliquis) Atrial Fibrillation  ?Rhythm:Irregular Rate:Normal ? ? ?  ?Neuro/Psych ?negative neurological ROS ? negative psych ROS  ? GI/Hepatic ?Neg liver ROS, GERD  ,  ?Endo/Other  ?negative endocrine ROS ? Renal/GU ?negative Renal ROS  ?negative genitourinary ?  ?Musculoskeletal ? ?(+) Arthritis , Osteoarthritis,   ? Abdominal ?Normal abdominal exam  (+)   ?Peds ? Hematology ?PCV   ?Anesthesia Other Findings ? ? Reproductive/Obstetrics ? ?  ? ? ? ? ? ? ? ? ? ? ? ? ? ?  ?  ? ? ? ? ? ?Please refer to progress note 12/18/21 for Anesthesia Chart Review. Junie Bame, DNP, CRNA, NP-C ?Short Stay/Anesthesia ? ?Anesthesia Physical ?Anesthesia Plan ? ?ASA: 3 ? ?Anesthesia Plan: General  ? ?Post-op Pain Management:   ? ?Induction: Intravenous ? ?PONV Risk Score and Plan: 3 and Ondansetron, Dexamethasone, Treatment may vary due to age or medical condition, Amisulpride, TIVA and Propofol infusion ? ?Airway Management Planned: Mask and Oral ETT ? ?Additional Equipment: None ? ?Intra-op Plan:  ? ?Post-operative Plan: Extubation in OR ? ?Informed Consent: I have reviewed the patients History and Physical, chart, labs and discussed the procedure including the risks, benefits and alternatives for the proposed anesthesia with the patient or authorized representative who has indicated his/her understanding and acceptance.  ? ? ? ?Dental advisory given ? ?Plan Discussed  with: CRNA ? ?Anesthesia Plan Comments: (Lab Results ?     Component                Value               Date                 ?     WBC                      8.3                 11/23/2021           ?     HGB                      18.0 (H)            11/23/2021           ?     HCT                      53.0 (H)            11/23/2021           ?     MCV                      104.9 (H)           11/23/2021           ?  PLT                      433 (H)             11/23/2021           ?Lab Results ?     Component                Value               Date                 ?     NA                       138                 11/23/2021           ?     K                        4.1                 11/23/2021           ?     CO2                      28                  11/23/2021           ?     GLUCOSE                  100 (H)             11/23/2021           ?     BUN                      24 (H)              11/23/2021           ?     CREATININE               0.80                11/23/2021           ?     CALCIUM                  9.3                 11/23/2021           ?     GFRNONAA                 >60                 11/23/2021           ? ?Per pulm note 11/25/21: "PET scan 11/16/2021 reviewed by me shows the right middle lobe mass is hypermetabolic with some associated pleural thickening and a small right pleural effusion ?? ?Chest performed CT-PA/3/23 reviewed by me showed no evidence of pulmonary embolism right middle lobe mass 2.8 x 4.5 cm with a small right pleural effusion, slightly larger than 11/16/2021. ?There are some pleural and subpleural nodularity in the right hemithorax"  ???? ?CV: ??TTE (10/23/19) ?IMPRESSIONS  ??1.  Left ventricular ejection fraction, by estimation, is 60 to 65%. The  ?left ventricle has normal function. The left ventricle has no regional  ?wall motion abnormalities. Left ventricular diastolic parameters are  ?consistent with Grade I diastolic  ?dysfunction (impaired relaxation).  ??2. Right  ventricular systolic function is normal. The right ventricular  ?size is normal. There is mildly elevated pulmonary artery systolic  ?pressure.  ??3. Left atrial size was moderately dilated.  ??4. The mitral valve is abnormal. Mild mitral valve regurgitation.  ??5. The aortic valve is tricuspid. Aortic valve regurgitation is not  ?visualized.  ??6. The inferior vena cava is dilated in size with <50% respiratory  ?variability, suggesting right atrial pressure of 15 mmHg. ?? ?Exercise Tolerance Test (02/26/20) ?? There was no ST segment deviation noted during stress. ?? No T wave inversion was noted during stress. ?? ?1. Negative ECG stress for ischemia.  ?2. Poor exercise tolerance (3:28 min:s; 4.6 METS).  ?3. Appropriate heart rate/BP response to exercise.  ?4. This is an intermediate risk study. )  ? ? ? ? ? ?Anesthesia Quick Evaluation ? ?

## 2021-12-18 NOTE — Progress Notes (Signed)
Surgical Instructions for [name] ? ? ? Your procedure is scheduled on Monday, 12/21/21. ? Report to [facility] Main Entrance "A" at 8 A.M., then check in with the Admitting office. ? Call this number if you have problems the morning of surgery: ? [phone] ? ? If you have any questions prior to your surgery date call [phone]: Open Monday-Friday 8am-4pm ? ? ? Remember: ? Do not eat or drink after midnight Sunday (12/20/21).   ?  ? Take these medicines the morning of surgery with A SIP OF WATER: Tylenol PRN ?Tambocor, Hydrea, Lidocaine Patch PRN ? ? ?As of today, STOP taking any Aspirin (unless otherwise instructed by your surgeon) Aleve, Naproxen, Ibuprofen, Motrin, Advil, Goody's, BC's, all herbal medications, fish oil, and all vitamins. ? ?         ?Do not wear jewelry or makeup ?Do not wear lotions, powders, perfumes, or deodorant. ?Do not shave 48 hours prior to surgery.   ?Do not bring valuables to the hospital. ?Do not wear nail polish, gel polish, artificial nails, or any other type of covering on natural nails (fingers and toes) ?If you have artificial nails or gel coating that need to be removed by a nail salon, please have this removed prior to surgery. Artificial nails or gel coating may interfere with anesthesia's ability to adequately monitor your vital signs. ? ?[facility] is not responsible for any belongings or valuables. .  ? ?Do NOT Smoke (Tobacco/Vaping)  24 hours prior to your procedure ?  ?Contacts, glasses, hearing aids, dentures or partials may not be worn into surgery, please bring cases for these belongings ?  ?Patients discharged the day of surgery will not be allowed to drive home, and someone needs to stay with them for 24 hours. ? ? ?SURGICAL WAITING ROOM VISITATION ?Patients having surgery or a procedure in a hospital may have two support people. ?Children under the age of 62 must have an adult with them who is not the patient. ?They may stay in the waiting area during the  procedure and may switch out with other visitors. If the patient needs to stay at the hospital during part of their recovery, the visitor guidelines for inpatient rooms apply. ? ?Please refer to the [facility] website for the visitor guidelines for Inpatients (after your surgery is over and you are in a regular room).  ? ? ?Special instructions:   ? ?Oral Hygiene is also important to reduce your risk of infection.  Remember - BRUSH YOUR TEETH THE MORNING OF SURGERY WITH YOUR REGULAR TOOTHPASTE ? ?

## 2021-12-18 NOTE — Progress Notes (Signed)
Anesthesia Chart Review: ? ? Case: [id number] Date/Time: 12/21/21 1100  ? Procedure: ROBOTIC ASSISTED NAVIGATIONAL BRONCHOSCOPY (Right)  ? Anesthesia type: General  ? Pre-op diagnosis: RIGHT MIDDLE LOBE MASS  ? Location: [facility] / [facility]  ? Surgeons: Collene Gobble, MD  ? ?  ? ?DISCUSSION: ?[name] is an 88 year old female with a PMHx of HTN, Afib on eliquis, polycythemia vera on hydroxyurea, former smoker, prior bilateral breast cancer and history of syncope that may have been precipitated by hydralazine who is followed by pulmonology.  ? ?Seen by pulmonology on 11/25/2021 for right mid lobe opacity that had been following since (09/2020) "consistent with lung cancer.  Hypermetabolic on PET scan 11/22/7406.  We have managed this conservatively in absence of symptoms.  Her last PET did show some nonspecific uptake at T10 which prompted an MRI thoracic spine. MRI T-spine 06/23/2021 reviewed by me showed no evidence of metastatic disease to the thoracic spine." At appt complaints of cough, dyspnea and pleuritic chest pain.  ? ?PET scan and chest CT reviewed with enlarging R middle lobe mass and hypermetabolism on PET scan. Decision made to proceed with bronch.  ? ?Of note last seen by Cards on 06/29/21. At the time with labile BPs and order to stop all BP meds.  ? ? ?VS: Ht 5\' 8"  (1.727 m)   Wt 59 kg   BMI 19.77 kg/m?  ? ?  12/18/2021  ? 11:16 AM 11/25/2021  ?  2:41 PM 11/23/2021  ? 12:50 PM  ?Vitals with BMI  ?Height 5\' 8"  5\' 8"    ?Weight 130 lbs 143 lbs   ?BMI 19.77 21.75   ?Systolic  144 818  ?Diastolic  74 75  ?Pulse  69 71  ? ? ? ?PROVIDERS: ?[name], MD ? ? ?LABS:  ? Latest Reference Range & Units 11/23/21 09:21  ?Sodium 135 - 145 mmol/L 138  ?Potassium 3.5 - 5.1 mmol/L 4.1  ?Chloride 98 - 111 mmol/L 100  ?Glucose 70 - 99 mg/dL 100 (H)  ?BUN 8 - 23 mg/dL 24 (H)  ?Creatinine 0.44 - 1.00 mg/dL 0.80  ?Calcium Ionized 1.15 - 1.40 mmol/L 1.18  ?Hemoglobin 12.0 - 15.0 g/dL 18.0 (H)  ?HCT 36.0  - 46.0 % 53.0 (H)  ?(H): Data is abnormally high ? ? ? ?IMAGES: ?Per pulm note 11/25/21: "PET scan 11/16/2021 reviewed by me shows the right middle lobe mass is hypermetabolic with some associated pleural thickening and a small right pleural effusion ?  ?Chest performed CT-PA/3/23 reviewed by me showed no evidence of pulmonary embolism right middle lobe mass 2.8 x 4.5 cm with a small right pleural effusion, slightly larger than 11/16/2021.  There are some pleural and subpleural nodularity in the right hemithorax"  ? ? ?EKG: ?11/24/10: Afib 79 bpm, anterior infarct old ? ?CV: ? ?TTE (10/23/19) ?IMPRESSIONS  ? 1. Left ventricular ejection fraction, by estimation, is 60 to 65%. The  ?left ventricle has normal function. The left ventricle has no regional  ?wall motion abnormalities. Left ventricular diastolic parameters are  ?consistent with Grade I diastolic  ?dysfunction (impaired relaxation).  ? 2. Right ventricular systolic function is normal. The right ventricular  ?size is normal. There is mildly elevated pulmonary artery systolic  ?pressure.  ? 3. Left atrial size was moderately dilated.  ? 4. The mitral valve is abnormal. Mild mitral valve regurgitation.  ? 5. The aortic valve is tricuspid. Aortic valve regurgitation is not  ?visualized.  ? 6. The  inferior vena cava is dilated in size with <50% respiratory  ?variability, suggesting right atrial pressure of 15 mmHg. ? ?Exercise Tolerance Test (02/26/20) ?There was no ST segment deviation noted during stress. ?No T wave inversion was noted during stress. ?  ?1. Negative ECG stress for ischemia.  ?2. Poor exercise tolerance (3:28 min:s; 4.6 METS).  ?3. Appropriate heart rate/BP response to exercise.  ?4. This is an intermediate risk study.  ? ?Past Medical History:  ?Diagnosis Date  ? Actinic keratoses 07/24/2018  ? Adhesive capsulitis of shoulder 12/06/2013  ? Arthritis   ? Atrial fibrillation ([place]) 07/17/2018  ? Benign essential HTN 07/24/2018  ? Last Assessment & Plan:   She is doing better with adjustments in her blood pressure medication and will continue this monitoring. Last Assessment & Plan:  She is doing better with adjustments in her blood pressure medication and will continue this monitoring.  ? Brittle nails 03/06/2019  ? Cancer [facility])   ? Breast  ? Complete rupture of rotator cuff 11/29/2013  ? Complication of anesthesia   ? Dysrhythmia   ? A-Fib  ? GERD (gastroesophageal reflux disease)   ? 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 History of breast cancer 07/11/2012  ? Bilateral mastectomies with reconstruction.  ? Hx of basal cell carcinoma excision 07/24/2018  ? Hx of melanoma in situ 07/24/2018  ? Hx of squamous cell carcinoma of skin 07/24/2018  ? Hyperlipidemia 07/24/2018  ? Lentigines 03/06/2019  ? Myocardial infarction [facility])   ? Silent Heart Attack  ? Past myocardial infarction 03/06/2019  ? Polycythemia   ? Polycythemia vera ([place]) 07/17/2018  ? Last Assessment & Plan:  The patient's blood counts remain in the desired range on her current dose of Hydrea.  She will continue this medication without dose modification.  She is moving to [facility] next week and we will help her arrange for necessary follow-up there.  She will return here on an as-needed basis.  ? Ptosis of eyelid 12/05/2009  ? Seborrheic keratosis 03/06/2019  ? 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  ? ? ?Past Surgical History:  ?Procedure Laterality Date  ? ABDOMINAL HYSTERECTOMY  2005  ? ACHILLES TENDON SURGERY Right   ? 2008  ? ANKLE FRACTURE SURGERY Left 2000  ? Breast Recontstruction Right   ? 2004  ? COLONOSCOPY W/ POLYPECTOMY    ? EYE SURGERY Bilateral   ? Cataract  ? FASCIOTOMY  2001  ? FEMUR SURGERY  2018  ? Crushed Femur,   ? HAMMER TOE SURGERY Bilateral 2014  ? HERNIA REPAIR  2012  ? abdominal  ? LAMINECTOMY  1994  ? L3-L4, L4-L5  (1987 C2-C3, C3-C4)  ? MASTECTOMY Bilateral   ? 1997-R, L -2004  ? MENISCUS REPAIR Right 2007  ? arthroscopy  ? SHOULDER OPEN ROTATOR CUFF REPAIR Right 2015  ? SKIN CANCER  EXCISION    ? basal, squamous, melonoma  ? TONSILLECTOMY    ? ? ?MEDICATIONS: ?No current facility-administered medications for this encounter.  ? ? acetaminophen (TYLENOL) 325 MG tablet  ? acetaminophen (TYLENOL) 650 MG CR tablet  ? Ascorbic Acid (VITAMIN C PO)  ? ELIQUIS 5 MG TABS tablet  ? EPINEPHrine 0.3 mg/0.3 mL IJ SOAJ injection  ? flecainide (TAMBOCOR) 50 MG tablet  ? furosemide (LASIX) 20 MG tablet  ? hydroxyurea (HYDREA) 500 MG capsule  ? lidocaine 4 %  ? magnesium hydroxide (MILK OF MAGNESIA) 400 MG/5ML suspension  ? polyethylene glycol (MIRALAX / GLYCOLAX) 17 g packet  ? sennosides-docusate sodium (  SENOKOT-S) 8.6-50 MG tablet  ? traMADol (ULTRAM) 50 MG tablet  ? ? ? ?Junie Bame, DNP, CRNA, NP-C ?Short Stay/Anesthesia ? ? ? ? ? ?

## 2021-12-21 ENCOUNTER — Other Ambulatory Visit: Payer: Self-pay

## 2021-12-21 ENCOUNTER — Encounter (HOSPITAL_COMMUNITY): Payer: Self-pay | Admitting: Emergency Medicine

## 2021-12-21 ENCOUNTER — Encounter: Payer: Self-pay | Admitting: Internal Medicine

## 2021-12-21 ENCOUNTER — Ambulatory Visit (HOSPITAL_COMMUNITY): Payer: Medicare Other

## 2021-12-21 ENCOUNTER — Other Ambulatory Visit: Payer: Self-pay | Admitting: Emergency Medicine

## 2021-12-21 ENCOUNTER — Telehealth: Payer: Self-pay | Admitting: Internal Medicine

## 2021-12-21 ENCOUNTER — Encounter (HOSPITAL_COMMUNITY)
Admission: RE | Disposition: A | Discharge status: Home / Self Care | Payer: Self-pay | Source: Ambulatory Visit | Attending: Emergency Medicine

## 2021-12-21 ENCOUNTER — Ambulatory Visit (HOSPITAL_COMMUNITY): Payer: Medicare Other | Admitting: Anesthesiology

## 2021-12-21 ENCOUNTER — Ambulatory Visit (HOSPITAL_COMMUNITY)
Admission: RE | Admit: 2021-12-21 | Discharge: 2021-12-21 | Disposition: A | Discharge status: Home / Self Care | Payer: Medicare Other | Source: Ambulatory Visit | Attending: Emergency Medicine | Admitting: Emergency Medicine

## 2021-12-21 ENCOUNTER — Ambulatory Visit (HOSPITAL_BASED_OUTPATIENT_CLINIC_OR_DEPARTMENT_OTHER): Payer: Medicare Other | Admitting: Anesthesiology

## 2021-12-21 DIAGNOSIS — C342 Malignant neoplasm of middle lobe, bronchus or lung: Secondary | ICD-10-CM | POA: Insufficient documentation

## 2021-12-21 DIAGNOSIS — I2510 Atherosclerotic heart disease of native coronary artery without angina pectoris: Secondary | ICD-10-CM | POA: Insufficient documentation

## 2021-12-21 DIAGNOSIS — Z7901 Long term (current) use of anticoagulants: Secondary | ICD-10-CM | POA: Diagnosis not present

## 2021-12-21 DIAGNOSIS — Z853 Personal history of malignant neoplasm of breast: Secondary | ICD-10-CM | POA: Insufficient documentation

## 2021-12-21 DIAGNOSIS — I10 Essential (primary) hypertension: Secondary | ICD-10-CM | POA: Insufficient documentation

## 2021-12-21 DIAGNOSIS — R918 Other nonspecific abnormal finding of lung field: Secondary | ICD-10-CM

## 2021-12-21 DIAGNOSIS — I252 Old myocardial infarction: Secondary | ICD-10-CM | POA: Diagnosis not present

## 2021-12-21 DIAGNOSIS — Z79899 Other long term (current) drug therapy: Secondary | ICD-10-CM | POA: Diagnosis not present

## 2021-12-21 DIAGNOSIS — Z87891 Personal history of nicotine dependence: Secondary | ICD-10-CM | POA: Diagnosis not present

## 2021-12-21 DIAGNOSIS — Z20822 Contact with and (suspected) exposure to covid-19: Secondary | ICD-10-CM | POA: Insufficient documentation

## 2021-12-21 DIAGNOSIS — I4891 Unspecified atrial fibrillation: Secondary | ICD-10-CM | POA: Diagnosis not present

## 2021-12-21 DIAGNOSIS — D45 Polycythemia vera: Secondary | ICD-10-CM | POA: Diagnosis not present

## 2021-12-21 HISTORY — PX: VIDEO BRONCHOSCOPY WITH RADIAL ENDOBRONCHIAL ULTRASOUND: SHX6849

## 2021-12-21 HISTORY — PX: BRONCHIAL BRUSHINGS: SHX5108

## 2021-12-21 HISTORY — PX: BRONCHIAL BIOPSY: SHX5109

## 2021-12-21 HISTORY — PX: BRONCHIAL NEEDLE ASPIRATION BIOPSY: SHX5106

## 2021-12-21 LAB — SARS CORONAVIRUS 2 BY RT PCR: SARS Coronavirus 2 by RT PCR: NEGATIVE

## 2021-12-21 SURGERY — BRONCHOSCOPY, WITH BIOPSY USING ELECTROMAGNETIC NAVIGATION
Anesthesia: General | Laterality: Right

## 2021-12-21 MED ORDER — FENTANYL CITRATE (PF) 100 MCG/2ML IJ SOLN
INTRAMUSCULAR | Status: AC
  Filled 2021-12-21: qty 2

## 2021-12-21 MED ORDER — PHENYLEPHRINE HCL-NACL 20-0.9 MG/250ML-% IV SOLN
INTRAVENOUS | Status: DC | PRN
  Administered 2021-12-21: 15 ug/min via INTRAVENOUS

## 2021-12-21 MED ORDER — FENTANYL CITRATE (PF) 250 MCG/5ML IJ SOLN
INTRAMUSCULAR | Status: DC | PRN
  Administered 2021-12-21: 100 ug via INTRAVENOUS

## 2021-12-21 MED ORDER — ACETAMINOPHEN 10 MG/ML IV SOLN: 1000.0000 mg | Freq: Once | INTRAVENOUS | Status: DC | PRN

## 2021-12-21 MED ORDER — PROPOFOL 500 MG/50ML IV EMUL
INTRAVENOUS | Status: DC | PRN
  Administered 2021-12-21: 125 ug/kg/min via INTRAVENOUS

## 2021-12-21 MED ORDER — TRAMADOL HCL 50 MG PO TABS: 50.0000 mg | ORAL_TABLET | Freq: Four times a day (QID) | ORAL | Status: DC | PRN

## 2021-12-21 MED ORDER — FENTANYL CITRATE (PF) 100 MCG/2ML IJ SOLN: 25.0000 ug | INTRAMUSCULAR | Status: DC | PRN

## 2021-12-21 MED ORDER — LACTATED RINGERS IV SOLN: INTRAVENOUS | Status: DC

## 2021-12-21 MED ORDER — ROCURONIUM BROMIDE 10 MG/ML (PF) SYRINGE
PREFILLED_SYRINGE | INTRAVENOUS | Status: DC | PRN
  Administered 2021-12-21: 50 mg via INTRAVENOUS

## 2021-12-21 MED ORDER — SUGAMMADEX SODIUM 200 MG/2ML IV SOLN
INTRAVENOUS | Status: DC | PRN
Start: 2021-12-21 — End: 2021-12-21
  Administered 2021-12-21: 200 mg via INTRAVENOUS

## 2021-12-21 MED ORDER — PROPOFOL 10 MG/ML IV BOLUS
INTRAVENOUS | Status: DC | PRN
  Administered 2021-12-21: 70 mg via INTRAVENOUS
  Administered 2021-12-21: 90 mg via INTRAVENOUS

## 2021-12-21 MED ORDER — LIDOCAINE 2% (20 MG/ML) 5 ML SYRINGE
INTRAMUSCULAR | Status: DC | PRN
  Administered 2021-12-21: 40 mg via INTRAVENOUS

## 2021-12-21 MED ORDER — ONDANSETRON HCL 4 MG/2ML IJ SOLN
INTRAMUSCULAR | Status: DC | PRN
  Administered 2021-12-21: 4 mg via INTRAVENOUS

## 2021-12-21 MED ORDER — DEXAMETHASONE SODIUM PHOSPHATE 10 MG/ML IJ SOLN
INTRAMUSCULAR | Status: DC | PRN
  Administered 2021-12-21: 5 mg via INTRAVENOUS

## 2021-12-21 MED ORDER — CHLORHEXIDINE GLUCONATE 0.12 % MT SOLN
OROMUCOSAL | Status: DC
Start: 2021-12-21 — End: 2021-12-21
  Filled 2021-12-21: qty 15

## 2021-12-21 MED ORDER — APIXABAN 5 MG PO TABS: 5.0000 mg | ORAL_TABLET | Freq: Two times a day (BID) | ORAL | Status: AC

## 2021-12-21 NOTE — Op Note (Signed)
Video Bronchoscopy with Robotic Assisted Bronchoscopic Navigation  ? ?Date of Operation: 12/21/2021  ? ?Pre-op Diagnosis: Right middle lobe mass ? ?Post-op Diagnosis: Same ? ?Surgeon: Baltazar Apo ? ?Assistants: None ? ?Anesthesia: General endotracheal anesthesia ? ?Operation: Flexible video fiberoptic bronchoscopy with robotic assistance and biopsies. ? ?Estimated Blood Loss: Minimal ? ?Complications: None ? ?Indications and History: ?[name] is a 88 y.o. female with history of breast cancer, prior tobacco use.  She has a slowly enlarging right middle lobe mass that we have been following conservatively with serial imaging.  She began to have pleuritic chest pain which prompted [place] to seek a tissue diagnosis.  Recommendation made to perform robotic assisted navigational bronchoscopy for biopsies. The risks, benefits, complications, treatment options and expected outcomes were discussed with the patient.  The possibilities of pneumothorax, pneumonia, reaction to medication, pulmonary aspiration, perforation of a viscus, bleeding, failure to diagnose a condition and creating a complication requiring transfusion or operation were discussed with the patient who freely signed the consent.   ? ?Description of Procedure: ?The patient was seen in the Preoperative Area, was examined and was deemed appropriate to proceed.  The patient was taken to [facility] endoscopy room 3, identified as [place] and the procedure verified as Flexible Video Fiberoptic Bronchoscopy.  A Time Out was held and the above information confirmed.  ? ?Prior to the date of the procedure a high-resolution CT scan of the chest was performed. Utilizing ION software program a virtual tracheobronchial tree was generated to allow the creation of distinct navigation pathways to the patient's parenchymal abnormality. After being taken to the operating room general anesthesia was initiated and the patient  was orally intubated. The video fiberoptic  bronchoscope was introduced via the endotracheal tube and a general inspection was performed which showed normal right and left lung anatomy. Aspiration of the bilateral mainstems was completed to remove any remaining secretions. Robotic catheter inserted into patient's endotracheal tube.  ? ?Target #1 right middle lobe mass: ?The distinct navigation pathways prepared prior to this procedure were then utilized to navigate to patient's lesion identified on CT scan. The robotic catheter was secured into place and the vision probe was withdrawn.  Lesion location was approximated using fluoroscopy and radial endobronchial ultrasound for peripheral targeting. Under fluoroscopic guidance transbronchial needle brushings, transbronchial needle biopsies, and transbronchial forceps biopsies were performed to be sent for cytology and pathology.  ? ? ?At the end of the procedure a general airway inspection was performed and there was no evidence of active bleeding. The bronchoscope was removed.  The patient tolerated the procedure well. There was no significant blood loss and there were no obvious complications. A post-procedural chest x-ray is pending. ? ?Samples Target #1: ?1. Transbronchial needle brushings from right middle lobe mass ?2. Transbronchial Wang needle biopsies from right middle lobe mass ?3. Transbronchial forceps biopsies from right middle lobe mass ? ? ?Plans:  ?The patient will be discharged from the PACU to home when recovered from anesthesia and after chest x-ray is reviewed. We will review the cytology, pathology and microbiology results with the patient when they become available. Outpatient followup will be with Dr. Lamonte Sakai.  ? ? ?Baltazar Apo, MD, PhD ?12/21/2021, 12:54 PM ?[facility] ?[phone] or if no answer before 7:00PM call [phone] ?For any issues after 7:00PM please call eLink [phone] ? ? ? ?

## 2021-12-21 NOTE — Transfer of Care (Signed)
Immediate Anesthesia Transfer of Care Note ? ?Patient: [name] ? ?Procedure(s) Performed: ROBOTIC ASSISTED NAVIGATIONAL BRONCHOSCOPY (Right) ?BRONCHIAL NEEDLE ASPIRATION BIOPSIES ?BRONCHIAL BIOPSIES ?BRONCHIAL BRUSHINGS ?VIDEO BRONCHOSCOPY WITH RADIAL ENDOBRONCHIAL ULTRASOUND ? ?Patient Location: PACU ? ?Anesthesia Type:General ? ?Level of Consciousness: drowsy and patient cooperative ? ?Airway & Oxygen Therapy: Patient Spontanous Breathing and Patient connected to face mask oxygen ? ?Post-op Assessment: Report given to RN and Post -op Vital signs reviewed and stable ? ?Post vital signs: Reviewed and stable ? ?Last Vitals:  ?Vitals Value Taken Time  ?BP 165/79   ?Temp    ?Pulse 64 12/21/21 1316  ?Resp 27 12/21/21 1316  ?SpO2 100 % 12/21/21 1316  ?Vitals shown include unvalidated device data. ? ?Last Pain:  ?Vitals:  ? 12/21/21 0823  ?PainSc: 4   ?   ? ?Patients Stated Pain Goal: 0 (12/21/21 8833) ? ?Complications: No notable events documented. ?

## 2021-12-21 NOTE — Anesthesia Postprocedure Evaluation (Signed)
Anesthesia Post Note ? ?Patient: [name] ? ?Procedure(s) Performed: ROBOTIC ASSISTED NAVIGATIONAL BRONCHOSCOPY (Right) ?BRONCHIAL NEEDLE ASPIRATION BIOPSIES ?BRONCHIAL BIOPSIES ?BRONCHIAL BRUSHINGS ?VIDEO BRONCHOSCOPY WITH RADIAL ENDOBRONCHIAL ULTRASOUND ? ?  ? ?Patient location during evaluation: PACU ?Anesthesia Type: General ?Level of consciousness: awake and alert ?Pain management: pain level controlled ?Vital Signs Assessment: post-procedure vital signs reviewed and stable ?Respiratory status: spontaneous breathing, nonlabored ventilation, respiratory function stable and patient connected to nasal cannula oxygen ?Cardiovascular status: blood pressure returned to baseline and stable ?Postop Assessment: no apparent nausea or vomiting ?Anesthetic complications: no ? ? ?No notable events documented. ? ?Last Vitals:  ?Vitals:  ? 12/21/21 1330 12/21/21 1342  ?BP: (!) 166/86 (!) 184/74  ?Pulse: 68 62  ?Resp: (!) 23 (!) 22  ?Temp:  36.7 ?C  ?SpO2: 96% 96%  ?  ?Last Pain:  ?Vitals:  ? 12/21/21 1342  ?PainSc: 4   ? ? ?  ?  ?  ?  ?  ?  ? ?Mohab Ashby S ? ? ? ? ?

## 2021-12-21 NOTE — Telephone Encounter (Signed)
Scheduled appt per 5/1 referral. Pt is aware of appt date and time. Pt is aware to arrive 15 mins prior to appt time and to bring and updated insurance card. Pt is aware of appt location.   ?

## 2021-12-21 NOTE — Discharge Instructions (Signed)
Flexible Bronchoscopy, Care After ?This sheet gives you information about how to care for yourself after your test. Your doctor may also give you more specific instructions. If you have problems or questions, contact your doctor. ?Follow these instructions at home: ?Eating and drinking ?Do not eat or drink anything (not even water) for 2 hours after your test, or until your numbing medicine (local anesthetic) wears off. ?When your numbness is gone and your cough and gag reflexes have come back, you may: ?Eat only soft foods. ?Slowly drink liquids. ?The day after the test, go back to your normal diet. ?Driving ?Do not drive for 24 hours if you were given a medicine to help you relax (sedative). ?Do not drive or use heavy machinery while taking prescription pain medicine. ?General instructions ? ?Take over-the-counter and prescription medicines only as told by your doctor. ?Return to your normal activities as told. Ask what activities are safe for you. ?Do not use any products that have nicotine or tobacco in them. This includes cigarettes and e-cigarettes. If you need help quitting, ask your doctor. ?Keep all follow-up visits as told by your doctor. This is important. It is very important if you had a tissue sample (biopsy) taken. ?Get help right away if: ?You have shortness of breath that gets worse. ?You get light-headed. ?You feel like you are going to pass out (faint). ?You have chest pain. ?You cough up: ?More than a little blood. ?More blood than before. ?Summary ?Do not eat or drink anything (not even water) for 2 hours after your test, or until your numbing medicine wears off. ?Do not use cigarettes. Do not use e-cigarettes. ?Get help right away if you have chest pain. ? ?Please call our office for any questions or concerns.  [phone]. ? ? ?This information is not intended to replace advice given to you by your health care provider. Make sure you discuss any questions you have with your health care  provider. ?Document Released: 06/06/2009 Document Revised: 07/22/2017 Document Reviewed: 08/27/2016 ?Elsevier Patient Education ? [place]. ? ?

## 2021-12-21 NOTE — Anesthesia Procedure Notes (Signed)
Procedure Name: Intubation ?Date/Time: 12/21/2021 12:20 PM ?Performed by: Valda Favia, CRNA ?Pre-anesthesia Checklist: Patient identified, Emergency Drugs available, Suction available, Patient being monitored and Timeout performed ?Patient Re-evaluated:Patient Re-evaluated prior to induction ?Oxygen Delivery Method: Circle system utilized ?Preoxygenation: Pre-oxygenation with 100% oxygen ?Induction Type: IV induction ?Ventilation: Mask ventilation without difficulty ?Laryngoscope Size: Mac and 3 ?Grade View: Grade II ?Tube type: Oral ?Tube size: 8.5 mm ?Number of attempts: 1 ?Airway Equipment and Method: Stylet ?Placement Confirmation: ETT inserted through vocal cords under direct vision, positive ETCO2 and breath sounds checked- equal and bilateral ?Secured at: 21 cm ?Tube secured with: Tape ?Dental Injury: Teeth and Oropharynx as per pre-operative assessment  ? ? ? ? ?

## 2021-12-21 NOTE — Interval H&P Note (Signed)
History and Physical Interval Note: ? ?12/21/2021 ?10:20 AM ? ?[name]  has presented today for surgery, with the diagnosis of RIGHT MIDDLE LOBE MASS.  The various methods of treatment have been discussed with the patient and family. After consideration of risks, benefits and other options for treatment, the patient has consented to  Procedure(s): ?ROBOTIC ASSISTED NAVIGATIONAL BRONCHOSCOPY (Right) as a surgical intervention.  The patient's history has been reviewed, patient examined, no change in status, stable for surgery.  I have reviewed the patient's chart and labs.  Questions were answered to the patient's satisfaction.   ? ? ?Rose Fillers Hanya Guerin ? ? ?

## 2021-12-22 ENCOUNTER — Encounter (HOSPITAL_COMMUNITY): Payer: Self-pay | Admitting: Emergency Medicine

## 2021-12-23 LAB — CYTOLOGY - NON PAP

## 2021-12-24 ENCOUNTER — Encounter: Payer: Self-pay | Admitting: *Deleted

## 2021-12-24 DIAGNOSIS — R918 Other nonspecific abnormal finding of lung field: Secondary | ICD-10-CM

## 2021-12-24 NOTE — Progress Notes (Signed)
Oncology Nurse Navigator Documentation ? ? ?  12/24/2021  ?  1:00 PM  ?Oncology Nurse Navigator Flowsheets  ?Navigator Follow Up Date: 12/30/2021  ?Navigator Follow Up Reason: New Patient Appointment  ?Navigator Location [facility]  ?Navigator Encounter Type Appt/Treatment Plan Review  ?Barriers/Navigation Needs Coordination of Care/I followed up on Ms. Wakefield appt. She is scheduled to see Dr. Julien Nordmann next week. Lab orders placed per protocol.   ?Interventions Coordination of Care  ?Acuity Level 2-Minimal Needs (1-2 Barriers Identified)  ?Coordination of Care Other  ?Time Spent with Patient 30  ?  ?

## 2021-12-25 ENCOUNTER — Telehealth: Payer: Self-pay | Admitting: Emergency Medicine

## 2021-12-25 NOTE — Telephone Encounter (Signed)
[name] patient's daughter would like results of Bronch procedure. [name] phone number is [phone]. ?

## 2021-12-25 NOTE — Telephone Encounter (Signed)
Reviewed pathology results with [name] by phone.  Right middle lobe biopsy show adenocarcinoma.  There should be enough to send for molecular studies.  Ms. Dulski has a an appointment with Dr. Julien Nordmann next week.  [name] is planning to join the visit by phone.  It sounds like they are interested in hearing about options to treat, manage pain, enhance her quality of life.  They would consider radiation, targeted therapies, immunotherapy.  Suspect they would be less interested in traditional chemotherapy.  I have assured them that all the different options will be discussed. ?

## 2021-12-30 ENCOUNTER — Inpatient Hospital Stay: Payer: Medicare Other | Attending: Internal Medicine | Admitting: Internal Medicine

## 2021-12-30 ENCOUNTER — Inpatient Hospital Stay: Payer: Medicare Other

## 2021-12-30 ENCOUNTER — Encounter: Payer: Self-pay | Admitting: Internal Medicine

## 2021-12-30 ENCOUNTER — Other Ambulatory Visit: Payer: Self-pay

## 2021-12-30 ENCOUNTER — Telehealth: Payer: Self-pay | Admitting: Radiation Oncology

## 2021-12-30 VITALS — BP 135/64 | HR 65 | Resp 18 | Wt 133.3 lb

## 2021-12-30 DIAGNOSIS — Z5111 Encounter for antineoplastic chemotherapy: Secondary | ICD-10-CM | POA: Diagnosis not present

## 2021-12-30 DIAGNOSIS — Z85828 Personal history of other malignant neoplasm of skin: Secondary | ICD-10-CM

## 2021-12-30 DIAGNOSIS — Z853 Personal history of malignant neoplasm of breast: Secondary | ICD-10-CM | POA: Diagnosis not present

## 2021-12-30 DIAGNOSIS — Z9013 Acquired absence of bilateral breasts and nipples: Secondary | ICD-10-CM

## 2021-12-30 DIAGNOSIS — Z79899 Other long term (current) drug therapy: Secondary | ICD-10-CM | POA: Diagnosis not present

## 2021-12-30 DIAGNOSIS — C342 Malignant neoplasm of middle lobe, bronchus or lung: Secondary | ICD-10-CM | POA: Insufficient documentation

## 2021-12-30 DIAGNOSIS — C3491 Malignant neoplasm of unspecified part of right bronchus or lung: Secondary | ICD-10-CM

## 2021-12-30 DIAGNOSIS — J918 Pleural effusion in other conditions classified elsewhere: Secondary | ICD-10-CM | POA: Diagnosis not present

## 2021-12-30 DIAGNOSIS — I10 Essential (primary) hypertension: Secondary | ICD-10-CM | POA: Diagnosis not present

## 2021-12-30 DIAGNOSIS — Z87891 Personal history of nicotine dependence: Secondary | ICD-10-CM | POA: Diagnosis not present

## 2021-12-30 DIAGNOSIS — I4891 Unspecified atrial fibrillation: Secondary | ICD-10-CM

## 2021-12-30 DIAGNOSIS — C782 Secondary malignant neoplasm of pleura: Secondary | ICD-10-CM | POA: Diagnosis not present

## 2021-12-30 DIAGNOSIS — R918 Other nonspecific abnormal finding of lung field: Secondary | ICD-10-CM

## 2021-12-30 DIAGNOSIS — C3490 Malignant neoplasm of unspecified part of unspecified bronchus or lung: Secondary | ICD-10-CM

## 2021-12-30 LAB — CBC WITH DIFFERENTIAL (CANCER CENTER ONLY)
Abs Immature Granulocytes: 0.08 10*3/uL — ABNORMAL HIGH (ref 0.00–0.07)
Basophils Absolute: 0.1 10*3/uL (ref 0.0–0.1)
Basophils Relative: 1 %
Eosinophils Absolute: 0.1 10*3/uL (ref 0.0–0.5)
Eosinophils Relative: 1 %
HCT: 43.1 % (ref 36.0–46.0)
Hemoglobin: 15.1 g/dL — ABNORMAL HIGH (ref 12.0–15.0)
Immature Granulocytes: 1 %
Lymphocytes Relative: 12 %
Lymphs Abs: 1.1 10*3/uL (ref 0.7–4.0)
MCH: 35.4 pg — ABNORMAL HIGH (ref 26.0–34.0)
MCHC: 35 g/dL (ref 30.0–36.0)
MCV: 101.2 fL — ABNORMAL HIGH (ref 80.0–100.0)
Monocytes Absolute: 0.4 10*3/uL (ref 0.1–1.0)
Monocytes Relative: 4 %
Neutro Abs: 7.3 10*3/uL (ref 1.7–7.7)
Neutrophils Relative %: 81 %
Platelet Count: 371 10*3/uL (ref 150–400)
RBC: 4.26 MIL/uL (ref 3.87–5.11)
RDW: 15.4 % (ref 11.5–15.5)
WBC Count: 9 10*3/uL (ref 4.0–10.5)
nRBC: 0 % (ref 0.0–0.2)

## 2021-12-30 LAB — CMP (CANCER CENTER ONLY)
ALT: 13 U/L (ref 0–44)
AST: 15 U/L (ref 15–41)
Albumin: 3.9 g/dL (ref 3.5–5.0)
Alkaline Phosphatase: 65 U/L (ref 38–126)
Anion gap: 5 (ref 5–15)
BUN: 16 mg/dL (ref 8–23)
CO2: 27 mmol/L (ref 22–32)
Calcium: 9.4 mg/dL (ref 8.9–10.3)
Chloride: 102 mmol/L (ref 98–111)
Creatinine: 0.75 mg/dL (ref 0.44–1.00)
GFR, Estimated: >60 mL/min (ref >60)
Glucose, Bld: 109 mg/dL — ABNORMAL HIGH (ref 70–99)
Potassium: 4.6 mmol/L (ref 3.5–5.1)
Sodium: 134 mmol/L — ABNORMAL LOW (ref 135–145)
Total Bilirubin: 0.5 mg/dL (ref 0.3–1.2)
Total Protein: 7 g/dL (ref 6.5–8.1)

## 2021-12-30 NOTE — Progress Notes (Signed)
? ? [place] ?[phone]   [phone] ? ?CONSULT NOTE ? ?REFERRING PHYSICIAN: Dr. Baltazar Apo ? ?REASON FOR CONSULTATION:  ?88 years old white female recently diagnosed with lung cancer. ? ?HPI ?MEYGAN KYSER is a 88 y.o. female with past medical history significant for hypertension, atrial fibrillation, history of breast cancer status post bilateral mastectomies, Grovers disease, polycythemia vera as well as history of skin cancer and remote history of smoking.  The patient mentioned that on Dec 29, 2020 she had chest x-ray for suspicious abnormality seen on outside imaging studies.  The chest x-ray showed an opacity in the right middle lobe measuring 4.3 x 1.7 cm suspicious for focal consolidation versus a pulmonary mass.  This was followed by CT scan of the chest on 01/13/2021 and it showed the opacity seen on the chest x-ray corresponds to a pleural-based mass within the lateral segment of the right middle lobe measuring 4.1 x 1.6 x 2.3 cm and the mass obliterates the subsegmental bronchus of the lateral segment of the right middle lobe and abuts the major and minor pleura.  There was also a 0.7 cm groundglass pulmonary nodule in the right lower lobe.  She was seen by Dr. Lamonte Sakai and followed by observation and repeat CT scan of the chest on April 04, 2021 showed stable 4.0 cm mass over the inferior aspect of the right upper lobe with no definite mediastinal or hilar adenopathy and the findings remain concerning for primary lung neoplasm.  There was also stable 0.6 cm groundglass nodule over the posterior right lower lobe and 0.2 cm nodule over the right upper lobe.  A PET scan was performed on 05/01/2021 and the right middle lobe lung mass was FDG avid concerning for primary bronchogenic carcinoma and tissue sampling was recommended.  There was no signs of FDG avid nodal metastasis or distant metastatic disease.  There was nonspecific FDG uptake localized to the T10  vertebral body with SUV max of 3.27.  There was no corresponding lytic or sclerotic bone lesions noted on the CT images.  The patient had MRI of the thoracic spine on June 23, 2021 and that showed no evidence of metastatic disease in the thoracic spines. ?For some reason the patient was followed by observation.  Repeat CT super D of the chest on October 21, 2021 showed enlarging pleural-based mass involving the right middle lobe and adjacent fissures with associated increase fissural and paraspinal pleural thickening suspicious for mesothelioma or other pleural-based malignancy.  There was additional small right pulmonary nodules that are not unchanged and no evidence of thoracic adenopathy. ?Repeat PET scan on November 16, 2021 showed right middle lobe lung mass with hypermetabolic pleural thickening and small right pleural effusion and the findings most indicative of a stage IV lung cancer. ?On Dec 21, 2021 the patient underwent video bronchoscopy with robotic assisted bronchoscopic navigation under the care of Dr. Lamonte Sakai. ?Final pathology [id number]) showed malignant cells consistent with adenocarcinoma.  There was sufficient material in the tissue block for molecular testing but this was not sent. ?[facility] kindly referred the patient to me today for evaluation and recommendation regarding treatment of her condition. ?When seen today she continues to have pain at the lower back with difficulty moving in the morning but she gets better as the day goes she is currently on tramadol and as needed basis.  She denied having any significant chest pain, shortness of breath, cough or hemoptysis.  She lost around 7 pounds in  the last few weeks.  She has occasional nausea and constipation but no vomiting or diarrhea.  She denied having any headache or visual changes. ?Family history significant for father with heart disease and colon cancer.  Mother had parkinsonism and dementia and died at age 49.  Sister had breast cancer.   Maternal grandmother had stomach cancer.  Daughter had breast cancer twice.  Another daughter has melanoma and leiomyosarcoma as well as history of colon cancer.  Her daughter mentioned that they have some detoxification abnormalities with accumulation of estrogen hormones resulting these abnormalities.  He had previous genetic testing and they are negative for BRCA genes. ?The patient is a widow and has 3 children.  She used to do computer work and currently retired.  She was accompanied today by her caregiver from the facility [facility].  Her daughter Bobbye Riggs was available by phone and FaceTime during the visit.  The patient has remote history of smoking for around 20 years but quit in 1997.  She has no history of alcohol or drug abuse. ? ?HPI ? ?Past Medical History:  ?Diagnosis Date  ? Actinic keratoses 07/24/2018  ? Adhesive capsulitis of shoulder 12/06/2013  ? Arthritis   ? Atrial fibrillation ([place]) 07/17/2018  ? Benign essential HTN 07/24/2018  ? Last Assessment & Plan:  She is doing better with adjustments in her blood pressure medication and will continue this monitoring. Last Assessment & Plan:  She is doing better with adjustments in her blood pressure medication and will continue this monitoring.  ? Brittle nails 03/06/2019  ? Cancer [facility])   ? Breast  ? Complete rupture of rotator cuff 11/29/2013  ? Complication of anesthesia   ? Dysrhythmia   ? A-Fib  ? GERD (gastroesophageal reflux disease)   ? 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 History of breast cancer 07/11/2012  ? Bilateral mastectomies with reconstruction.  ? Hx of basal cell carcinoma excision 07/24/2018  ? Hx of melanoma in situ 07/24/2018  ? Hx of squamous cell carcinoma of skin 07/24/2018  ? Hyperlipidemia 07/24/2018  ? Lentigines 03/06/2019  ? Myocardial infarction [facility])   ? Silent Heart Attack  ? Past myocardial infarction 03/06/2019  ? Polycythemia   ? Polycythemia vera ([place]) 07/17/2018  ? Last Assessment & Plan:  The patient's blood counts  remain in the desired range on her current dose of Hydrea.  She will continue this medication without dose modification.  She is moving to [facility] next week and we will help her arrange for necessary follow-up there.  She will return here on an as-needed basis.  ? Ptosis of eyelid 12/05/2009  ? Seborrheic keratosis 03/06/2019  ? 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  ? ? ?Past Surgical History:  ?Procedure Laterality Date  ? ABDOMINAL HYSTERECTOMY  2005  ? ACHILLES TENDON SURGERY Right   ? 2008  ? ANKLE FRACTURE SURGERY Left 2000  ? Breast Recontstruction Right   ? 2004  ? BRONCHIAL BIOPSY  12/21/2021  ? Procedure: BRONCHIAL BIOPSIES;  Surgeon: Collene Gobble, MD;  Location: [facility] ENDOSCOPY;  Service: Pulmonary;;  ? BRONCHIAL BRUSHINGS  12/21/2021  ? Procedure: BRONCHIAL BRUSHINGS;  Surgeon: Collene Gobble, MD;  Location: [facility] ENDOSCOPY;  Service: Pulmonary;;  ? BRONCHIAL NEEDLE ASPIRATION BIOPSY  12/21/2021  ? Procedure: BRONCHIAL NEEDLE ASPIRATION BIOPSIES;  Surgeon: Collene Gobble, MD;  Location[facility] ENDOSCOPY;  Service: Pulmonary;;  ? COLONOSCOPY W/ POLYPECTOMY    ? EYE SURGERY Bilateral   ? Cataract  ? FASCIOTOMY  2001  ?  FEMUR SURGERY  2018  ? Crushed Femur,   ? HAMMER TOE SURGERY Bilateral 2014  ? HERNIA REPAIR  2012  ? abdominal  ? LAMINECTOMY  1994  ? L3-L4, L4-L5  (1987 C2-C3, C3-C4)  ? MASTECTOMY Bilateral   ? 1997-R, L -2004  ? MENISCUS REPAIR Right 2007  ? arthroscopy  ? SHOULDER OPEN ROTATOR CUFF REPAIR Right 2015  ? SKIN CANCER EXCISION    ? basal, squamous, melonoma  ? TONSILLECTOMY    ? VIDEO BRONCHOSCOPY WITH RADIAL ENDOBRONCHIAL ULTRASOUND  12/21/2021  ? Procedure: VIDEO BRONCHOSCOPY WITH RADIAL ENDOBRONCHIAL ULTRASOUND;  Surgeon: Collene Gobble, MD;  Location: [facility] ENDOSCOPY;  Service: Pulmonary;;  ? ? ?Family History  ?Problem Relation Age of Onset  ? Heart attack Father   ? ? ?Social History ?Social History  ? ?Tobacco Use  ? Smoking status: Former  ?  Types: Cigarettes  ? Smokeless tobacco: Never  ?Vaping  Use  ? Vaping Use: Never used  ?Substance Use Topics  ? Alcohol use: Not Currently  ? Drug use: Never  ? ? ?Allergies  ?Allergen Reactions  ? Bee Venom Anaphylaxis  ?  Bee sting  ? Sulfa Antibiotics Anaphy

## 2021-12-30 NOTE — Telephone Encounter (Signed)
5/10 @ 1:59 pm Left voicemail for patient to call our office. ?

## 2021-12-31 ENCOUNTER — Telehealth: Payer: Self-pay | Admitting: Internal Medicine

## 2021-12-31 NOTE — Telephone Encounter (Signed)
per 05/10 los, called and spoke with patient's daughter. Patient will be notified. ?

## 2022-01-04 ENCOUNTER — Encounter: Payer: Self-pay | Admitting: *Deleted

## 2022-01-04 NOTE — Progress Notes (Signed)
Thoracic Location of Tumor / Histology: Adenocarcinoma of Right Lung-Middle Lobe ? ?Patient reports the area in her right Lung was noted about 1.5 years ago, imaging done at [facility] in [place].  She has been in observation with Dr. Lamonte Sakai since June 2022. ? ?MRI Brain: Unscheduled at this time. ? ?PET 11/16/2021: Right middle lobe mass with hypermetabolic pleural thickening and small right pleural effusion, findings most indicative of stage IV lung cancer.  Right middle lobe mass extends to the right hilum, measuring 3.1 x 3.9 cm, SUV max 5.6. ? ? ?Biopsies of RML Lung 12/21/2021 ? ? ? ?Tobacco/Marijuana/Snuff/ETOH use: Former Smoker, quit 1970. ? ?Past/Anticipated interventions by cardiothoracic surgery, if any:  ? ? ? ?Past/Anticipated interventions by medical oncology, if any:  ?Dr. Julien Nordmann 12/30/2020 ?-I discussed with the patient her treatment options including curative radiotherapy to the right middle lobe lung mass and close monitoring of the right pleural effusion and suspicious pleural-based metastasis which is not completely confirmed to be malignant yet. ?-The other option would be also consideration of palliative radiotherapy to the right middle lobe lung mass and consideration of systemic treatment with targeted therapy if the patient has an actionable mutations on the molecular studies or immunotherapy if she has no actionable mutation and positive PD-L1 expression versus consideration of a combination of chemo and immunotherapy which is my last resort option versus palliative care and hospice referral. ?-The patient and her daughter would like to wait for the molecular study and the complete staging work-up before making a decision regarding the systemic therapy but they are interested in proceeding with the radiotherapy. ?-I will refer the patient to radiation oncology for discussion of this option. ? ? ?Signs/Symptoms ?Weight changes, if any: Fluctuates a couple pounds. ?Respiratory complaints, if  any: None ?Hemoptysis, if any: None ?Pain issues, if any:  She reports occasional chest tightness, does not affect her breathing. ? ?SAFETY ISSUES: ?Prior radiation? No ?Pacemaker/ICD? No  ?Possible current pregnancy? Hysterectomy ?Is the patient on methotrexate? No ? ?Current Complaints / other details:   ?

## 2022-01-04 NOTE — Progress Notes (Signed)
Oncology Nurse Navigator Documentation ? ? ?  01/04/2022  ? 10:00 AM 12/24/2021  ?  1:00 PM  ?Oncology Nurse Navigator Flowsheets  ?Navigator Follow Up Date: 01/11/2022 12/30/2021  ?Navigator Follow Up Reason: Molecular Testing New Patient Appointment  ?Navigator Location [facility]  ?Navigator Encounter Type Molecular Studies Appt/Treatment Plan Review  ?Patient Visit Type Other   ?Barriers/Navigation Needs Coordination of Care/I followed up on Ms. Zechman molecular test results at [facility]. According to the patient portal, her test results are pending at this time.  Coordination of Care  ?Interventions Coordination of Care Coordination of Care  ?Acuity Level 2-Minimal Needs (1-2 Barriers Identified) Level 2-Minimal Needs (1-2 Barriers Identified)  ?Coordination of Care Pathology Other  ?Time Spent with Patient 30 30  ?  ?

## 2022-01-05 ENCOUNTER — Ambulatory Visit
Admission: RE | Admit: 2022-01-05 | Discharge: 2022-01-05 | Disposition: A | Discharge status: Home / Self Care | Payer: Medicare Other | Source: Ambulatory Visit | Attending: Radiation Oncology | Admitting: Radiation Oncology

## 2022-01-05 ENCOUNTER — Encounter: Payer: Self-pay | Admitting: Radiation Oncology

## 2022-01-05 ENCOUNTER — Other Ambulatory Visit: Payer: Self-pay | Admitting: Internal Medicine

## 2022-01-05 ENCOUNTER — Other Ambulatory Visit: Payer: Self-pay

## 2022-01-05 VITALS — Ht 68.0 in | Wt 133.0 lb

## 2022-01-05 DIAGNOSIS — C3491 Malignant neoplasm of unspecified part of right bronchus or lung: Secondary | ICD-10-CM

## 2022-01-05 DIAGNOSIS — C342 Malignant neoplasm of middle lobe, bronchus or lung: Secondary | ICD-10-CM

## 2022-01-05 NOTE — Progress Notes (Signed)
START OFF PATHWAY REGIMEN - Non-Small Cell Lung ? ? ?OFF00103:Carboplatin AUC=2 + Paclitaxel 45 mg/m2 Weekly: ?  Administer weekly: ?    Paclitaxel  ?    Carboplatin  ? ?**Always confirm dose/schedule in your pharmacy ordering system** ? ?Patient Characteristics: ?Preoperative or Nonsurgical Candidate (Clinical Staging), Distant Metastasis ?Therapeutic Status: Preoperative or Nonsurgical Candidate (Clinical Staging) ?AJCC T Category: cT2b ?AJCC N Category: cN0 ?AJCC M Category: WJ1B ?AJCC 8 Stage Grouping: IVA ?Intent of Therapy: ?Curative Intent, Discussed with Patient ?

## 2022-01-05 NOTE — Progress Notes (Signed)
?Radiation Oncology         [phone]) [phone] ?________________________________ ? ?Initial Outpatient Consultation - Conducted via telephone due to current COVID-19 concerns for limiting patient exposure ? ?I spoke with the patient to conduct this consult visit via telephone to spare the patient unnecessary potential exposure in the healthcare setting during the current COVID-19 pandemic. The patient was notified in advance and was offered a [place] meeting to allow for face to face communication but unfortunately reported that they did not have the appropriate resources/technology to support such a visit and instead preferred to proceed with a telephone consult.  ?  ? ?Name: [name]        MRN: [id number]  ?Date of Service: 01/05/2022 DOB: 01-Dec-1929 ? ?LK:JZPHXT, [place] Ar, MD  Curt Bears, MD    ? ?REFERRING PHYSICIAN: Curt Bears, MD ? ? ?DIAGNOSIS: The primary encounter diagnosis was Adenocarcinoma of right lung, stage 4 ([place]). A diagnosis of Primary malignant neoplasm of right middle lobe of lung (HCC) was also pertinent to this visit. ? ? ?HISTORY OF PRESENT ILLNESS: [name] is a 88 y.o. female seen at the request of Dr. Julien Nordmann for a diagnosis of stage IV lung cancer.  The patient had a chest x-ray in 2022 at an outside facility that showed a possible mass.  She has a history of breast cancer and for this reason then underwent a CT chest with contrast on 01/13/2021.  This showed a mass in the lateral segment of the right middle lobe measuring 4.1 cm.  It abuts the subsegmental bronchus and the lateral segment of the right middle lobe, it abuts the major and minor pleura but does not transgress the margins.  A separate right lower lobe nodule measuring 7 mm described as groundglass and indeterminate was also seen.  She had changes in T12 felt to be likely degenerative in nature.  She was referred to pulmonary, and CT without contrast in August 2022 showed stability in the changes in the  right lung though this was called right upper lobe at the time.  She underwent a PET scan on 05/01/2021 which showed hypermetabolic activity in the right middle lobe mass with an SUV of 5.28.  No additional hypermetabolism was seen in the chest, she did have mild uptake in the T10 vertebral body with an SUV max of 3.27 without corresponding lytic or sclerotic changes, the T11 vertebral body SUV was 2.26 for comparison and blood pool was 2.53.  She did have an MRI of the spine on 06/23/2021 to look more deeply at the thoracic levels called on her PET and no evidence of metastatic disease was identified.  The patient was followed in observation however the rationale was not clear.  She had repeat CT scans in March 2023 showing enlargement of the pleural-based mass now measuring 4.6 cm and involving the inferior aspect of the right major fissure.  A 4 mm right lower lobe nodule and subpleural right lower lobe nodule were also seen both were subcentimeter.  Repeat PET scan on 11/16/2021 showed hypermetabolic changes in the right middle lobe mass with associated right pleural effusion, SUV ranged from 4.5-5.6 including her pleural surface and fluid.  She underwent bronchoscopy on 12/21/2021, malignant cells consistent with adenocarcinoma were seen in the right middle lobe FNA and brushings.  She met with Dr. Julien Nordmann who recommends molecular testing and would consider systemic immunotherapy versus chemoimmunotherapy if her PD-L1 is expressed.  She is seen to discuss options of radiotherapy. ? ? ? ?  PREVIOUS RADIATION THERAPY: No ? ? ?PAST MEDICAL HISTORY:  ?Past Medical History:  ?Diagnosis Date  ? Actinic keratoses 07/24/2018  ? Adhesive capsulitis of shoulder 12/06/2013  ? Arthritis   ? Atrial fibrillation ([place]) 07/17/2018  ? Benign essential HTN 07/24/2018  ? Last Assessment & Plan:  She is doing better with adjustments in her blood pressure medication and will continue this monitoring. Last Assessment & Plan:  She is doing  better with adjustments in her blood pressure medication and will continue this monitoring.  ? Brittle nails 03/06/2019  ? Cancer [facility])   ? Breast  ? Complete rupture of rotator cuff 11/29/2013  ? Complication of anesthesia   ? Dysrhythmia   ? A-Fib  ? GERD (gastroesophageal reflux disease)   ? 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 History of breast cancer 07/11/2012  ? Bilateral mastectomies with reconstruction.  ? Hx of basal cell carcinoma excision 07/24/2018  ? Hx of melanoma in situ 07/24/2018  ? Hx of squamous cell carcinoma of skin 07/24/2018  ? Hyperlipidemia 07/24/2018  ? Lentigines 03/06/2019  ? Myocardial infarction [facility])   ? Silent Heart Attack  ? Past myocardial infarction 03/06/2019  ? Polycythemia   ? Polycythemia vera ([place]) 07/17/2018  ? Last Assessment & Plan:  The patient's blood counts remain in the desired range on her current dose of Hydrea.  She will continue this medication without dose modification.  She is moving to [facility] next week and we will help her arrange for necessary follow-up there.  She will return here on an as-needed basis.  ? Ptosis of eyelid 12/05/2009  ? Seborrheic keratosis 03/06/2019  ? 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  ?   ? ? ?PAST SURGICAL HISTORY: ?Past Surgical History:  ?Procedure Laterality Date  ? ABDOMINAL HYSTERECTOMY  2005  ? ACHILLES TENDON SURGERY Right   ? 2008  ? ANKLE FRACTURE SURGERY Left 2000  ? Breast Recontstruction Right   ? 2004  ? BRONCHIAL BIOPSY  12/21/2021  ? Procedure: BRONCHIAL BIOPSIES;  Surgeon: Collene Gobble, MD;  Location[facility] ENDOSCOPY;  Service: Pulmonary;;  ? BRONCHIAL BRUSHINGS  12/21/2021  ? Procedure: BRONCHIAL BRUSHINGS;  Surgeon: Collene Gobble, MD;  Location: [facility] ENDOSCOPY;  Service: Pulmonary;;  ? BRONCHIAL NEEDLE ASPIRATION BIOPSY  12/21/2021  ? Procedure: BRONCHIAL NEEDLE ASPIRATION BIOPSIES;  Surgeon: Collene Gobble, MD;  Location: [facility] ENDOSCOPY;  Service: Pulmonary;;  ? COLONOSCOPY W/ POLYPECTOMY    ? EYE SURGERY Bilateral   ? Cataract   ? FASCIOTOMY  2001  ? FEMUR SURGERY  2018  ? Crushed Femur,   ? HAMMER TOE SURGERY Bilateral 2014  ? HERNIA REPAIR  2012  ? abdominal  ? LAMINECTOMY  1994  ? L3-L4, L4-L5  (1987 C2-C3, C3-C4)  ? MASTECTOMY Bilateral   ? 1997-R, L -2004  ? MENISCUS REPAIR Right 2007  ? arthroscopy  ? SHOULDER OPEN ROTATOR CUFF REPAIR Right 2015  ? SKIN CANCER EXCISION    ? basal, squamous, melonoma  ? TONSILLECTOMY    ? VIDEO BRONCHOSCOPY WITH RADIAL ENDOBRONCHIAL ULTRASOUND  12/21/2021  ? Procedure: VIDEO BRONCHOSCOPY WITH RADIAL ENDOBRONCHIAL ULTRASOUND;  Surgeon: Collene Gobble, MD;  Location: [facility] ENDOSCOPY;  Service: Pulmonary;;  ? ? ? ?FAMILY HISTORY:  ?Family History  ?Problem Relation Age of Onset  ? Heart attack Father   ? ? ? ?SOCIAL HISTORY:  reports that she has quit smoking. Her smoking use included cigarettes. She has never used smokeless tobacco. She reports that she does not currently  use alcohol. She reports that she does not use drugs. The patient is widowed and lives in [place].   ? ? ?ALLERGIES: Bee venom, Sulfa antibiotics, Sulfamethoxazole, Hydrocodone-acetaminophen, Meperidine, Morphine, Oxycodone hcl, Oxycodone-acetaminophen, Anesthesia s-i-40 [propofol], Caffeine, Hydrocodone, Lactose, Oxycodone-aspirin, Propoxyphene, Soy allergy, Codeine, Latex, and Valdecoxib ? ? ?MEDICATIONS:  ?Current Outpatient Medications  ?Medication Sig Dispense Refill  ? acetaminophen (TYLENOL) 325 MG tablet Take 650 mg by mouth daily as needed for moderate pain or mild pain (4,000 mg daily as needed).    ? acetaminophen (TYLENOL) 650 MG CR tablet Take 1,300 mg by mouth 2 (two) times daily.    ? apixaban (ELIQUIS) 5 MG TABS tablet Take 1 tablet (5 mg total) by mouth 2 (two) times daily. Okay to restart this medication on 12/22/2021    ? Ascorbic Acid (VITAMIN C PO) Take 1,000 mg by mouth daily.    ? EPINEPHrine 0.3 mg/0.3 mL IJ SOAJ injection Inject 0.3 mg into the muscle as needed for anaphylaxis.    ? flecainide (TAMBOCOR) 50 MG tablet  Take 1 tablet (50 mg total) by mouth 2 (two) times daily. 180 tablet 2  ? furosemide (LASIX) 20 MG tablet Take 20 mg by mouth daily.    ? hydroxyurea (HYDREA) 500 MG capsule Take 500 mg by mouth See admin

## 2022-01-06 ENCOUNTER — Telehealth: Payer: Self-pay

## 2022-01-06 ENCOUNTER — Ambulatory Visit
Admission: RE | Admit: 2022-01-06 | Discharge: 2022-01-06 | Disposition: A | Discharge status: Home / Self Care | Payer: Medicare Other | Source: Ambulatory Visit | Attending: Radiation Oncology | Admitting: Radiation Oncology

## 2022-01-06 ENCOUNTER — Telehealth: Payer: Self-pay | Admitting: Hospice

## 2022-01-06 ENCOUNTER — Telehealth: Payer: Self-pay | Admitting: Internal Medicine

## 2022-01-06 DIAGNOSIS — Z510 Encounter for antineoplastic radiation therapy: Secondary | ICD-10-CM | POA: Diagnosis present

## 2022-01-06 DIAGNOSIS — C342 Malignant neoplasm of middle lobe, bronchus or lung: Secondary | ICD-10-CM | POA: Insufficient documentation

## 2022-01-06 NOTE — Telephone Encounter (Signed)
Attempted to contact patient's daughter [name] to schedule a Palliative Care consult appointment. No answer left a message to return call.  ?

## 2022-01-06 NOTE — Telephone Encounter (Signed)
Rec'd call from patient's daughter inquiring about the Palliative referral for her Mom, I explained where the referral came from and daughter stated that patient was in Skilled Rehab at [facility] currently and will be there for a total of 6-8 weeks and she would like to cancel the Palliative referral for now and see if patient is discharged back to her Independent Living after rehab.  Daughter stated that she would keep our number and call us if they need our services later. ?

## 2022-01-06 NOTE — Telephone Encounter (Signed)
Scheduled appointment per inbasket message. Patient aware.   ?

## 2022-01-06 NOTE — Addendum Note (Signed)
Encounter addended by: Kyung Rudd, MD on: 01/06/2022 10:07 AM ? Actions taken: Cosign clinical note with attestation

## 2022-01-07 ENCOUNTER — Other Ambulatory Visit: Payer: Self-pay | Admitting: Physician Assistant

## 2022-01-07 ENCOUNTER — Telehealth: Payer: Self-pay

## 2022-01-07 ENCOUNTER — Inpatient Hospital Stay: Payer: Medicare Other

## 2022-01-07 DIAGNOSIS — C3491 Malignant neoplasm of unspecified part of right bronchus or lung: Secondary | ICD-10-CM

## 2022-01-07 MED ORDER — PROCHLORPERAZINE MALEATE 10 MG PO TABS: 10.0000 mg | ORAL_TABLET | Freq: Four times a day (QID) | ORAL | 2 refills | Status: AC | PRN

## 2022-01-07 NOTE — Telephone Encounter (Signed)
Spoke with staff at [facility]. [name] refused to go to class this am. She is overwhelmed with the idea of treatments. They requested that Dr. Worthy Flank nurse call and speak with her about her concerns. Gave the staff the main number to [facility] and told  them to choose prompt 4 to get to Dr. Worthy Flank nurse.

## 2022-01-08 DIAGNOSIS — Z510 Encounter for antineoplastic radiation therapy: Secondary | ICD-10-CM | POA: Diagnosis not present

## 2022-01-11 ENCOUNTER — Other Ambulatory Visit: Payer: Self-pay

## 2022-01-11 ENCOUNTER — Encounter: Payer: Self-pay | Admitting: *Deleted

## 2022-01-11 ENCOUNTER — Ambulatory Visit
Admission: RE | Admit: 2022-01-11 | Discharge: 2022-01-11 | Disposition: A | Discharge status: Home / Self Care | Payer: Medicare Other | Source: Ambulatory Visit | Attending: Radiation Oncology | Admitting: Radiation Oncology

## 2022-01-11 DIAGNOSIS — Z510 Encounter for antineoplastic radiation therapy: Secondary | ICD-10-CM | POA: Diagnosis not present

## 2022-01-11 LAB — RAD ONC ARIA SESSION SUMMARY
Course Elapsed Days: 0
Plan Fractions Treated to Date: 1
Plan Prescribed Dose Per Fraction: 2 Gy
Plan Total Fractions Prescribed: 30
Plan Total Prescribed Dose: 60 Gy
Reference Point Dosage Given to Date: 2 Gy
Reference Point Session Dosage Given: 2 Gy
Session Number: 1

## 2022-01-11 NOTE — Progress Notes (Signed)
Oncology Nurse Navigator Documentation     01/11/2022    3:00 PM 01/04/2022   10:00 AM 12/24/2021    1:00 PM  Oncology Nurse Navigator Flowsheets  Navigator Follow Up Date: 01/13/2022 01/11/2022 12/30/2021  Navigator Follow Up Reason: Follow-up Appointment Molecular Testing New Patient Appointment  Navigator Location [place]  Navigator Encounter Type Telephone Molecular Studies Appt/Treatment Plan Review  Telephone Outgoing Call    Patient Visit Type  Other   Treatment Phase Pre-Tx/Tx Discussion    Barriers/Navigation Needs Coordination of Care;Education/I received a message that patient was upset about her getting scheduled for chemo. I called patient but unable to reach. I did call the daughter and updated. She would have like a phone call about chemo being set up.  I did explain that the intention for the appt is to expedite care.  Daughter verbalized understanding.  Coordination of Care Coordination of Care  Education Other    Interventions Coordination of Care;Education Coordination of Care Coordination of Care  Acuity Level 3-Moderate Needs (3-4 Barriers Identified) Level 2-Minimal Needs (1-2 Barriers Identified) Level 2-Minimal Needs (1-2 Barriers Identified)  Coordination of Care Other Pathology Other  Education Method Verbal    Time Spent with Patient 30 30 30

## 2022-01-12 ENCOUNTER — Ambulatory Visit
Admission: RE | Admit: 2022-01-12 | Discharge: 2022-01-12 | Disposition: A | Discharge status: Home / Self Care | Payer: Medicare Other | Source: Ambulatory Visit | Attending: Radiation Oncology | Admitting: Radiation Oncology

## 2022-01-12 ENCOUNTER — Ambulatory Visit (HOSPITAL_COMMUNITY)
Admission: RE | Admit: 2022-01-12 | Discharge: 2022-01-12 | Disposition: A | Discharge status: Home / Self Care | Payer: Medicare Other | Source: Ambulatory Visit | Attending: Internal Medicine | Admitting: Internal Medicine

## 2022-01-12 ENCOUNTER — Other Ambulatory Visit: Payer: Self-pay

## 2022-01-12 DIAGNOSIS — C3490 Malignant neoplasm of unspecified part of unspecified bronchus or lung: Secondary | ICD-10-CM | POA: Insufficient documentation

## 2022-01-12 DIAGNOSIS — Z510 Encounter for antineoplastic radiation therapy: Secondary | ICD-10-CM | POA: Diagnosis not present

## 2022-01-12 LAB — RAD ONC ARIA SESSION SUMMARY
Course Elapsed Days: 1
Plan Fractions Treated to Date: 2
Plan Prescribed Dose Per Fraction: 2 Gy
Plan Total Fractions Prescribed: 30
Plan Total Prescribed Dose: 60 Gy
Reference Point Dosage Given to Date: 4 Gy
Reference Point Session Dosage Given: 2 Gy
Session Number: 2

## 2022-01-12 MED ORDER — GADOBUTROL 1 MMOL/ML IV SOLN
6.0000 mL | Freq: Once | INTRAVENOUS | Status: AC | PRN
  Administered 2022-01-12: 6 mL via INTRAVENOUS

## 2022-01-12 MED FILL — Dexamethasone Sodium Phosphate Inj 100 MG/10ML: INTRAMUSCULAR | Qty: 1 | Status: AC

## 2022-01-13 ENCOUNTER — Inpatient Hospital Stay: Payer: Medicare Other

## 2022-01-13 ENCOUNTER — Encounter: Payer: Self-pay | Admitting: Internal Medicine

## 2022-01-13 ENCOUNTER — Inpatient Hospital Stay (HOSPITAL_BASED_OUTPATIENT_CLINIC_OR_DEPARTMENT_OTHER): Payer: Medicare Other | Admitting: Internal Medicine

## 2022-01-13 ENCOUNTER — Ambulatory Visit
Admission: RE | Admit: 2022-01-13 | Discharge: 2022-01-13 | Disposition: A | Discharge status: Home / Self Care | Payer: Medicare Other | Source: Ambulatory Visit | Attending: Radiation Oncology | Admitting: Radiation Oncology

## 2022-01-13 ENCOUNTER — Other Ambulatory Visit: Payer: Self-pay

## 2022-01-13 VITALS — BP 149/71 | HR 68 | Temp 96.8°F | Resp 17 | Wt 128.4 lb

## 2022-01-13 DIAGNOSIS — C3491 Malignant neoplasm of unspecified part of right bronchus or lung: Secondary | ICD-10-CM

## 2022-01-13 DIAGNOSIS — Z510 Encounter for antineoplastic radiation therapy: Secondary | ICD-10-CM | POA: Diagnosis not present

## 2022-01-13 LAB — CMP (CANCER CENTER ONLY)
ALT: 10 U/L (ref 0–44)
AST: 14 U/L — ABNORMAL LOW (ref 15–41)
Albumin: 4.1 g/dL (ref 3.5–5.0)
Alkaline Phosphatase: 93 U/L (ref 38–126)
Anion gap: 6 (ref 5–15)
BUN: 20 mg/dL (ref 8–23)
CO2: 28 mmol/L (ref 22–32)
Calcium: 9.7 mg/dL (ref 8.9–10.3)
Chloride: 101 mmol/L (ref 98–111)
Creatinine: 0.78 mg/dL (ref 0.44–1.00)
GFR, Estimated: >60 mL/min (ref >60)
Glucose, Bld: 105 mg/dL — ABNORMAL HIGH (ref 70–99)
Potassium: 4.2 mmol/L (ref 3.5–5.1)
Sodium: 135 mmol/L (ref 135–145)
Total Bilirubin: 0.7 mg/dL (ref 0.3–1.2)
Total Protein: 7.1 g/dL (ref 6.5–8.1)

## 2022-01-13 LAB — RAD ONC ARIA SESSION SUMMARY
Course Elapsed Days: 2
Plan Fractions Treated to Date: 3
Plan Prescribed Dose Per Fraction: 2 Gy
Plan Total Fractions Prescribed: 30
Plan Total Prescribed Dose: 60 Gy
Reference Point Dosage Given to Date: 6 Gy
Reference Point Session Dosage Given: 2 Gy
Session Number: 3

## 2022-01-13 LAB — CBC WITH DIFFERENTIAL (CANCER CENTER ONLY)
Abs Immature Granulocytes: 0.07 10*3/uL (ref 0.00–0.07)
Basophils Absolute: 0.1 10*3/uL (ref 0.0–0.1)
Basophils Relative: 1 %
Eosinophils Absolute: 0.1 10*3/uL (ref 0.0–0.5)
Eosinophils Relative: 1 %
HCT: 43.8 % (ref 36.0–46.0)
Hemoglobin: 15.4 g/dL — ABNORMAL HIGH (ref 12.0–15.0)
Immature Granulocytes: 1 %
Lymphocytes Relative: 12 %
Lymphs Abs: 1.1 10*3/uL (ref 0.7–4.0)
MCH: 35.6 pg — ABNORMAL HIGH (ref 26.0–34.0)
MCHC: 35.2 g/dL (ref 30.0–36.0)
MCV: 101.2 fL — ABNORMAL HIGH (ref 80.0–100.0)
Monocytes Absolute: 0.3 10*3/uL (ref 0.1–1.0)
Monocytes Relative: 3 %
Neutro Abs: 7.9 10*3/uL — ABNORMAL HIGH (ref 1.7–7.7)
Neutrophils Relative %: 82 %
Platelet Count: 431 10*3/uL — ABNORMAL HIGH (ref 150–400)
RBC: 4.33 MIL/uL (ref 3.87–5.11)
RDW: 16.2 % — ABNORMAL HIGH (ref 11.5–15.5)
WBC Count: 9.5 10*3/uL (ref 4.0–10.5)
nRBC: 0 % (ref 0.0–0.2)

## 2022-01-13 NOTE — Progress Notes (Signed)
[place] [phone]   [phone]  OFFICE PROGRESS NOTE  [name], MD [address] [place] [address]  DIAGNOSIS: Stage IV (T2b, N0, M1 a) non-small cell lung cancer, adenocarcinoma presented with large right middle lobe lung mass with suspicious right pleural effusion and pleural-based metastasis diagnosed in May 2023  Detected Alteration(s) / Biomarker(s) Associated FDA-approved therapies Clinical Trial Availability % cfDNA or Amplification JAK2 [id number] None Yes 67.2% TP53 [id number] None Yes 0.2%  PDL1 Expression 1%  PRIOR THERAPY: None  CURRENT THERAPY: The course of concurrent chemoradiation with weekly carboplatin for AUC of 2 and paclitaxel 45 Mg/M2.  First cycle Jan 19, 2022.  INTERVAL HISTORY: [name] 88 y.o. female returns to the clinic today for follow-up visit accompanied by her caregiver Lorrain Callicutt.  Her 2 daughters Bobbye Riggs and Tivis Ringer were available by phone during the visit.  The patient is feeling fine today with no concerning complaints except for anxiety about her condition and treatment.  She also complain of back pain with radiation under the rib cage.  She is currently on Tylenol as well as tramadol with some improvement.  She denied having any current nausea, vomiting, diarrhea or constipation.  She continues to have cough with baseline shortness of breath but no hemoptysis.  She denied having any recent weight loss or night sweats.  She has no headache or visual changes.  She had several studies performed since her last visit including MRI of the brain that showed no evidence of metastatic disease to the brain.  The patient had molecular studies by [facility] that showed no actionable mutations but did show JAK2 and TP53 mutation.  She also has PD-L1 expression of 1%.  The patient was seen by radiation oncology and they are considering her for a course of radiotherapy with consideration of  radiosensitizing chemotherapy.  MEDICAL HISTORY: Past Medical History:  Diagnosis Date   Actinic keratoses 07/24/2018   Adhesive capsulitis of shoulder 12/06/2013   Arthritis    Atrial fibrillation ([place]) 07/17/2018   Benign essential HTN 07/24/2018   Last Assessment & Plan:  She is doing better with adjustments in her blood pressure medication and will continue this monitoring. Last Assessment & Plan:  She is doing better with adjustments in her blood pressure medication and will continue this monitoring.   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   Hx of basal cell carcinoma excision 07/24/2018   Hx of melanoma in situ 07/24/2018   Hx of squamous cell carcinoma of skin 07/24/2018   Hyperlipidemia 07/24/2018   Lentigines 03/06/2019   Myocardial infarction [facility])    Silent Heart Attack   Past myocardial infarction 03/06/2019   Polycythemia    Polycythemia vera ([place]) 07/17/2018   Last Assessment & Plan:  The patient's blood counts remain in the desired range on her current dose of Hydrea.  She will continue this medication without dose modification.  She is moving to [facility] next week and we will help her arrange for necessary follow-up there.  She will return here on an as-needed basis.   Ptosis of eyelid 12/05/2009   Seborrheic keratosis 03/06/2019   Vasodepressor  syncope 07/11/2012    ALLERGIES:  is allergic to bee venom, sulfa antibiotics, sulfamethoxazole, hydrocodone-acetaminophen, meperidine, morphine, oxycodone hcl, oxycodone-acetaminophen, anesthesia [id number] [propofol], caffeine, hydrocodone, lactose, oxycodone-aspirin, propoxyphene, soy allergy, codeine, latex, and valdecoxib.  MEDICATIONS:  Current  Outpatient Medications  Medication Sig Dispense Refill   acetaminophen (TYLENOL) 325 MG tablet Take 650 mg by mouth daily as needed for moderate pain or mild pain (4,000 mg daily as needed).     acetaminophen (TYLENOL) 650 MG CR tablet Take 1,300 mg by mouth 2 (two) times daily.     apixaban (ELIQUIS) 5 MG TABS tablet Take 1 tablet (5 mg total) by mouth 2 (two) times daily. Okay to restart this medication on 12/22/2021     Ascorbic Acid (VITAMIN C PO) Take 1,000 mg by mouth daily.     EPINEPHrine 0.3 mg/0.3 mL IJ SOAJ injection Inject 0.3 mg into the muscle as needed for anaphylaxis.     flecainide (TAMBOCOR) 50 MG tablet Take 1 tablet (50 mg total) by mouth 2 (two) times daily. 180 tablet 2   furosemide (LASIX) 20 MG tablet Take 20 mg by mouth daily.     hydroxyurea (HYDREA) 500 MG capsule Take 500 mg by mouth See admin instructions. Take 500 mg by mouth twice daily Monday-Friday and once daily on Saturday and Sunday     lidocaine 4 % Place 1 patch onto the skin every 12 (twelve) hours. 12 hours on 12 hours off     magnesium hydroxide (MILK OF MAGNESIA) 400 MG/5ML suspension Take 30 mLs by mouth daily.     Menthol, Topical Analgesic, (BIOFREEZE ROLL-ON) 4 % GEL Apply topically.     polyethylene glycol (MIRALAX / GLYCOLAX) 17 g packet Take 17 g by mouth daily.     prochlorperazine (COMPAZINE) 10 MG tablet Take 1 tablet (10 mg total) by mouth every 6 (six) hours as needed. 30 tablet 2   sennosides-docusate sodium (SENOKOT-S) 8.6-50 MG tablet Take 1 tablet by mouth daily.     traMADol (ULTRAM) 50 MG tablet Take 1 tablet (50 mg total) by mouth every 6 (six) hours as needed for severe pain.     No current facility-administered medications for this visit.    SURGICAL HISTORY:  Past Surgical History:  Procedure Laterality Date   ABDOMINAL HYSTERECTOMY  2005   ACHILLES TENDON SURGERY Right    2008   ANKLE FRACTURE SURGERY Left 2000   Breast Recontstruction Right    2004   BRONCHIAL BIOPSY   12/21/2021   Procedure: BRONCHIAL BIOPSIES;  Surgeon: Collene Gobble, MD;  Location: [facility] ENDOSCOPY;  Service: Pulmonary;;   BRONCHIAL BRUSHINGS  12/21/2021   Procedure: BRONCHIAL BRUSHINGS;  Surgeon: Collene Gobble, MD;  Location[facility] ENDOSCOPY;  Service: Pulmonary;;   BRONCHIAL NEEDLE ASPIRATION BIOPSY  12/21/2021   Procedure: BRONCHIAL NEEDLE ASPIRATION BIOPSIES;  Surgeon: Collene Gobble, MD;  Location: [facility] ENDOSCOPY;  Service: Pulmonary;;   COLONOSCOPY W/ POLYPECTOMY     EYE SURGERY Bilateral    Cataract   FASCIOTOMY  2001   FEMUR SURGERY  2018   Crushed Femur,    HAMMER TOE SURGERY Bilateral 2014   HERNIA REPAIR  2012   abdominal   LAMINECTOMY  1994   L3-L4, L4-L5  (1987 C2-C3, C3-C4)   MASTECTOMY Bilateral    1997-R, L -2004   MENISCUS REPAIR Right 2007   arthroscopy   SHOULDER OPEN ROTATOR CUFF REPAIR Right 2015   SKIN CANCER EXCISION     basal, squamous, melonoma  TONSILLECTOMY     VIDEO BRONCHOSCOPY WITH RADIAL ENDOBRONCHIAL ULTRASOUND  12/21/2021   Procedure: VIDEO BRONCHOSCOPY WITH RADIAL ENDOBRONCHIAL ULTRASOUND;  Surgeon: Collene Gobble, MD;  Location: [facility] ENDOSCOPY;  Service: Pulmonary;;    REVIEW OF SYSTEMS:  Constitutional: positive for fatigue Eyes: negative Ears, nose, mouth, throat, and face: negative Respiratory: positive for cough, dyspnea on exertion, and pleurisy/chest pain Cardiovascular: negative Gastrointestinal: negative Genitourinary:negative Integument/breast: negative Hematologic/lymphatic: negative Musculoskeletal:positive for back pain Neurological: negative Behavioral/Psych: positive for anxiety Endocrine: negative Allergic/Immunologic: negative   PHYSICAL EXAMINATION: General appearance: alert, cooperative, fatigued, and no distress Head: Normocephalic, without obvious abnormality, atraumatic Neck: no adenopathy, no JVD, supple, symmetrical, trachea midline, and thyroid not enlarged, symmetric, no tenderness/mass/nodules Lymph nodes: Cervical,  supraclavicular, and axillary nodes normal. Resp: diminished breath sounds RLL and dullness to percussion RLL Back: symmetric, no curvature. ROM normal. No CVA tenderness. Cardio: regular rate and rhythm, S1, S2 normal, no murmur, click, rub or gallop GI: soft, non-tender; bowel sounds normal; no masses,  no organomegaly Extremities: extremities normal, atraumatic, no cyanosis or edema Neurologic: Alert and oriented X 3, normal strength and tone. Normal symmetric reflexes. Normal coordination and gait  ECOG PERFORMANCE STATUS: 1 - Symptomatic but completely ambulatory  Blood pressure (!) 149/71, pulse 68, temperature (!) 96.8 F (36 C), temperature source Tympanic, resp. rate 17, weight 128 lb 7 oz (58.3 kg), SpO2 96 %.  LABORATORY DATA: Lab Results  Component Value Date   WBC 9.0 12/30/2021   HGB 15.1 (H) 12/30/2021   HCT 43.1 12/30/2021   MCV 101.2 (H) 12/30/2021   PLT 371 12/30/2021      Chemistry      Component Value Date/Time   NA 134 (L) 12/30/2021 1104   K 4.6 12/30/2021 1104   CL 102 12/30/2021 1104   CO2 27 12/30/2021 1104   BUN 16 12/30/2021 1104   CREATININE 0.75 12/30/2021 1104      Component Value Date/Time   CALCIUM 9.4 12/30/2021 1104   ALKPHOS 65 12/30/2021 1104   AST 15 12/30/2021 1104   ALT 13 12/30/2021 1104   BILITOT 0.5 12/30/2021 1104       RADIOGRAPHIC STUDIES: MR BRAIN W WO CONTRAST  Result Date: 01/13/2022 CLINICAL DATA:  Provided history: Malignant neoplasm of unspecified part of unspecified bronchus or lung. Non-small cell lung cancer, staging. EXAM: MRI HEAD WITHOUT AND WITH CONTRAST TECHNIQUE: Multiplanar, multiecho pulse sequences of the brain and surrounding structures were obtained without and with intravenous contrast. CONTRAST:  80mL GADAVIST GADOBUTROL 1 MMOL/ML IV SOLN COMPARISON:  Prior head CT examinations 12/06/2021 and earlier. FINDINGS: Brain: Moderate frontal predominant cerebral atrophy. Commensurate prominence of the ventricles  and sulci. Comparatively mild cerebellar atrophy. Multifocal T2 FLAIR hyperintense signal abnormality within the cerebral white matter and pons, nonspecific but compatible with moderate chronic small vessel ischemic disease. Chronic lacunar infarct within the right thalamus. There is no acute infarct. No evidence of an intracranial mass. No chronic intracranial blood products. No extra-axial fluid collection. No midline shift. No pathologic intracranial enhancement identified. Vascular: Maintained flow voids within the proximal large arterial vessels. Skull and upper cervical spine: No focal suspicious marrow lesion. Incompletely assessed cervical spondylosis. Sinuses/Orbits: No mass or acute finding within the imaged orbits. Prior bilateral ocular lens replacement. Trace mucosal thickening within the bilateral ethmoid sinuses. IMPRESSION: 1. No evidence of intracranial metastatic disease. 2. Moderate chronic small vessel ischemic changes within the cerebral white matter and pons. 3. Chronic lacunar infarct within the right thalamus. 4. Moderate  frontal predominant cerebral atrophy. 5. Comparatively mild cerebellar atrophy. Electronically Signed   By: Kellie Simmering D.O.   On: 01/13/2022 08:06   DG Chest Port 1 View  Result Date: 12/21/2021 CLINICAL DATA:  S/P bronchoscopy with biopsy [id number], right side per - RN Tech wore surgical mask/pt had no mask EXAM: PORTABLE CHEST - 1 VIEW COMPARISON:  11/23/2021 and previous FINDINGS: No pneumothorax. A skin fold overlies the scapula but lung markings are seen peripherally. Right hilar mass is again noted, margins slightly less conspicuous. There are some hazy interstitial opacities in the mid right lung and infrahilar region conceivably regional alveolar hemorrhage from recent biopsy. Left lung remains clear. Heart size and mediastinal contours are within normal limits. Aortic Atherosclerosis (ICD10-170.0). No effusion. Visualized bones unremarkable.  Surgical clips left  axilla. IMPRESSION: No pneumothorax post biopsy Electronically Signed   By: Lucrezia Europe M.D.   On: 12/21/2021 13:30   DG C-ARM BRONCHOSCOPY  Result Date: 12/21/2021 C-ARM BRONCHOSCOPY: Fluoroscopy was utilized by the requesting physician.  No radiographic interpretation.    ASSESSMENT AND PLAN: This is a very pleasant 88 years old white female with Stage IV (T2b, N0, M1 a) non-small cell lung cancer, adenocarcinoma presented with large right middle lobe lung mass with suspicious right pleural effusion and pleural-based metastasis diagnosed in May 2023 The molecular studies showed no actionable mutation but she has JAK2 mutation and TP53 PD-L1 expression is 1%. I had a lengthy discussion with the patient and her family today about her current condition and treatment options.  The patient was given the option of just palliative radiotherapy as recommended by Dr. Lisbeth Renshaw versus consideration of a course of concurrent chemo and radiation with weekly carboplatin for AUC of 2 and paclitaxel 45 Mg/M2 versus palliative care and close monitoring. After discussion of the options with the patient and her family they are interested in proceeding with a course of concurrent chemoradiation with the radiosensitizing chemotherapy. She is expected to start the first dose of her chemotherapy next week but she already started radiotherapy. I also discussed with them her molecular studies and MRI of the brain results. The patient will come back for follow-up visit in around 2 weeks for evaluation and management of any adverse effect of her treatment. For the back pain, she is currently undergoing radiotherapy and hopefully this will improve in the near future.  She is also on tramadol and Tylenol on as-needed basis. For the history of polycythemia vera, I recommended for her to hold her treatment with hydroxyurea for now during her course of chemotherapy.  We may resume it again in the future. The patient was advised to  call immediately if she has any other concerning symptoms in the interval.  The patient voices understanding of current disease status and treatment options and is in agreement with the current care plan.  All questions were answered. The patient knows to call the clinic with any problems, questions or concerns. We can certainly see the patient much sooner if necessary.  The total time spent in the appointment was 55 minutes.  Disclaimer: This note was dictated with voice recognition software. Similar sounding words can inadvertently be transcribed and may not be corrected upon review.

## 2022-01-14 ENCOUNTER — Ambulatory Visit
Admission: RE | Admit: 2022-01-14 | Discharge: 2022-01-14 | Disposition: A | Discharge status: Home / Self Care | Payer: Medicare Other | Source: Ambulatory Visit | Attending: Radiation Oncology | Admitting: Radiation Oncology

## 2022-01-14 ENCOUNTER — Other Ambulatory Visit: Payer: Self-pay

## 2022-01-14 ENCOUNTER — Telehealth: Payer: Self-pay | Admitting: Internal Medicine

## 2022-01-14 ENCOUNTER — Other Ambulatory Visit: Payer: Medicare Other

## 2022-01-14 DIAGNOSIS — C3491 Malignant neoplasm of unspecified part of right bronchus or lung: Secondary | ICD-10-CM

## 2022-01-14 DIAGNOSIS — Z510 Encounter for antineoplastic radiation therapy: Secondary | ICD-10-CM | POA: Diagnosis not present

## 2022-01-14 LAB — RAD ONC ARIA SESSION SUMMARY
Course Elapsed Days: 3
Plan Fractions Treated to Date: 4
Plan Prescribed Dose Per Fraction: 2 Gy
Plan Total Fractions Prescribed: 30
Plan Total Prescribed Dose: 60 Gy
Reference Point Dosage Given to Date: 8 Gy
Reference Point Session Dosage Given: 2 Gy
Session Number: 4

## 2022-01-14 LAB — GUARDANT 360

## 2022-01-14 MED ORDER — SONAFINE EX EMUL
1.0000 "application " | Freq: Once | CUTANEOUS | Status: AC
  Administered 2022-01-14: 1 via TOPICAL

## 2022-01-14 NOTE — Progress Notes (Signed)
Pt here for patient teaching.  Pt given Radiation and You booklet, skin care instructions, and Sonafine.  Reviewed areas of pertinence such as fatigue, hair loss, skin changes, throat changes, cough, and shortness of breath . Pt able to give teach back of to pat skin and use unscented/gentle soap,apply Sonafine bid and avoid applying anything to skin within 4 hours of treatment. Pt verbalizes understanding of information given and will contact nursing with any questions or concerns.    Gloriajean Dell. Leonie Green, BSN

## 2022-01-14 NOTE — Telephone Encounter (Signed)
Scheduled per 05/24 los, spoke with patient's caregiver. Patient will be notified.

## 2022-01-15 ENCOUNTER — Other Ambulatory Visit: Payer: Self-pay

## 2022-01-15 ENCOUNTER — Telehealth: Payer: Self-pay | Admitting: Internal Medicine

## 2022-01-15 ENCOUNTER — Encounter: Payer: Self-pay | Admitting: *Deleted

## 2022-01-15 ENCOUNTER — Inpatient Hospital Stay: Payer: Medicare Other

## 2022-01-15 ENCOUNTER — Ambulatory Visit
Admission: RE | Admit: 2022-01-15 | Discharge: 2022-01-15 | Disposition: A | Discharge status: Home / Self Care | Payer: Medicare Other | Source: Ambulatory Visit | Attending: Radiation Oncology | Admitting: Radiation Oncology

## 2022-01-15 DIAGNOSIS — Z510 Encounter for antineoplastic radiation therapy: Secondary | ICD-10-CM | POA: Diagnosis not present

## 2022-01-15 LAB — RAD ONC ARIA SESSION SUMMARY
Course Elapsed Days: 4
Plan Fractions Treated to Date: 5
Plan Prescribed Dose Per Fraction: 2 Gy
Plan Total Fractions Prescribed: 30
Plan Total Prescribed Dose: 60 Gy
Reference Point Dosage Given to Date: 10 Gy
Reference Point Session Dosage Given: 2 Gy
Session Number: 5

## 2022-01-15 NOTE — Progress Notes (Signed)
Oncology Nurse Navigator Documentation     01/15/2022    1:00 PM 01/11/2022    3:00 PM 01/04/2022   10:00 AM 12/24/2021    1:00 PM  Oncology Nurse Navigator Flowsheets  Abnormal Finding Date 05/01/2021     Confirmed Diagnosis Date 12/21/2021     Diagnosis Status Confirmed Diagnosis Complete     Planned Course of Treatment Chemo/Radiation Concurrent     Phase of Treatment Radiation     Chemotherapy Actual Start Date: 01/21/2022     Radiation Actual Start Date: 01/14/2022     Navigator Follow Up Date: 01/26/2022 01/13/2022 01/11/2022 12/30/2021  Navigator Follow Up Reason: Follow-up Appointment Follow-up Appointment Molecular Testing New Patient Appointment  Navigator Location [facility]  Navigator Encounter Type Appt/Treatment Plan Review Telephone Molecular Studies Appt/Treatment Plan Review  Telephone  Outgoing Call    Treatment Initiated Date 01/14/2022     Patient Visit Type   Other   Treatment Phase Treatment Pre-Tx/Tx Discussion    Barriers/Navigation Needs Coordination of Care/I followed up on Ms. Tkach plan of care and schedule. She is set up for concurrent chemo rad with follow up with med onc on 6/6.   Coordination of Care;Education Coordination of Care Coordination of Care  Education  Other    Interventions Coordination of Care Coordination of Care;Education Coordination of Care Coordination of Care  Acuity Level 2-Minimal Needs (1-2 Barriers Identified) Level 3-Moderate Needs (3-4 Barriers Identified) Level 2-Minimal Needs (1-2 Barriers Identified) Level 2-Minimal Needs (1-2 Barriers Identified)  Coordination of Care Other Other Pathology Other  Education Method  Verbal    Time Spent with Patient 30 30 30  30

## 2022-01-15 NOTE — Telephone Encounter (Signed)
Called and spoke with patient regarding 05/30 appointment, patient is notified.

## 2022-01-19 ENCOUNTER — Other Ambulatory Visit: Payer: Self-pay

## 2022-01-19 ENCOUNTER — Inpatient Hospital Stay: Payer: Medicare Other

## 2022-01-19 ENCOUNTER — Ambulatory Visit: Payer: Medicare Other

## 2022-01-19 ENCOUNTER — Other Ambulatory Visit: Payer: Medicare Other

## 2022-01-19 ENCOUNTER — Other Ambulatory Visit: Payer: Self-pay | Admitting: Internal Medicine

## 2022-01-19 ENCOUNTER — Ambulatory Visit
Admission: RE | Admit: 2022-01-19 | Discharge: 2022-01-19 | Disposition: A | Discharge status: Home / Self Care | Payer: Medicare Other | Source: Ambulatory Visit | Attending: Radiation Oncology | Admitting: Radiation Oncology

## 2022-01-19 VITALS — BP 183/88 | HR 67 | Temp 98.1°F | Resp 17

## 2022-01-19 DIAGNOSIS — Z510 Encounter for antineoplastic radiation therapy: Secondary | ICD-10-CM | POA: Diagnosis not present

## 2022-01-19 DIAGNOSIS — C3491 Malignant neoplasm of unspecified part of right bronchus or lung: Secondary | ICD-10-CM

## 2022-01-19 LAB — RAD ONC ARIA SESSION SUMMARY
Course Elapsed Days: 8
Plan Fractions Treated to Date: 6
Plan Prescribed Dose Per Fraction: 2 Gy
Plan Total Fractions Prescribed: 30
Plan Total Prescribed Dose: 60 Gy
Reference Point Dosage Given to Date: 12 Gy
Reference Point Session Dosage Given: 2 Gy
Session Number: 6

## 2022-01-19 LAB — CBC WITH DIFFERENTIAL (CANCER CENTER ONLY)
Abs Immature Granulocytes: 0.05 10*3/uL (ref 0.00–0.07)
Basophils Absolute: 0.1 10*3/uL (ref 0.0–0.1)
Basophils Relative: 1 %
Eosinophils Absolute: 0.1 10*3/uL (ref 0.0–0.5)
Eosinophils Relative: 1 %
HCT: 42.7 % (ref 36.0–46.0)
Hemoglobin: 14.9 g/dL (ref 12.0–15.0)
Immature Granulocytes: 1 %
Lymphocytes Relative: 11 %
Lymphs Abs: 0.8 10*3/uL (ref 0.7–4.0)
MCH: 35.5 pg — ABNORMAL HIGH (ref 26.0–34.0)
MCHC: 34.9 g/dL (ref 30.0–36.0)
MCV: 101.7 fL — ABNORMAL HIGH (ref 80.0–100.0)
Monocytes Absolute: 0.3 10*3/uL (ref 0.1–1.0)
Monocytes Relative: 4 %
Neutro Abs: 6.5 10*3/uL (ref 1.7–7.7)
Neutrophils Relative %: 82 %
Platelet Count: 473 10*3/uL — ABNORMAL HIGH (ref 150–400)
RBC: 4.2 MIL/uL (ref 3.87–5.11)
RDW: 16.5 % — ABNORMAL HIGH (ref 11.5–15.5)
WBC Count: 7.9 10*3/uL (ref 4.0–10.5)
nRBC: 0 % (ref 0.0–0.2)

## 2022-01-19 LAB — CMP (CANCER CENTER ONLY)
ALT: 11 U/L (ref 0–44)
AST: 13 U/L — ABNORMAL LOW (ref 15–41)
Albumin: 4.1 g/dL (ref 3.5–5.0)
Alkaline Phosphatase: 93 U/L (ref 38–126)
Anion gap: 7 (ref 5–15)
BUN: 17 mg/dL (ref 8–23)
CO2: 30 mmol/L (ref 22–32)
Calcium: 9.7 mg/dL (ref 8.9–10.3)
Chloride: 101 mmol/L (ref 98–111)
Creatinine: 0.73 mg/dL (ref 0.44–1.00)
GFR, Estimated: >60 mL/min (ref >60)
Glucose, Bld: 106 mg/dL — ABNORMAL HIGH (ref 70–99)
Potassium: 3.9 mmol/L (ref 3.5–5.1)
Sodium: 138 mmol/L (ref 135–145)
Total Bilirubin: 0.6 mg/dL (ref 0.3–1.2)
Total Protein: 7.1 g/dL (ref 6.5–8.1)

## 2022-01-19 MED ORDER — PALONOSETRON HCL INJECTION 0.25 MG/5ML
0.2500 mg | Freq: Once | INTRAVENOUS | Status: AC
  Administered 2022-01-19: 0.25 mg via INTRAVENOUS
  Filled 2022-01-19: qty 5

## 2022-01-19 MED ORDER — SODIUM CHLORIDE 0.9 % IV SOLN
45.0000 mg/m2 | Freq: Once | INTRAVENOUS | Status: AC
  Administered 2022-01-19: 78 mg via INTRAVENOUS
  Filled 2022-01-19: qty 13

## 2022-01-19 MED ORDER — SODIUM CHLORIDE 0.9 % IV SOLN
10.0000 mg | Freq: Once | INTRAVENOUS | Status: AC
  Administered 2022-01-19: 10 mg via INTRAVENOUS
  Filled 2022-01-19: qty 10

## 2022-01-19 MED ORDER — SODIUM CHLORIDE 0.9 % IV SOLN
122.6000 mg | Freq: Once | INTRAVENOUS | Status: AC
  Administered 2022-01-19: 120 mg via INTRAVENOUS
  Filled 2022-01-19: qty 12

## 2022-01-19 MED ORDER — DIPHENHYDRAMINE HCL 50 MG/ML IJ SOLN
50.0000 mg | Freq: Once | INTRAMUSCULAR | Status: AC
  Administered 2022-01-19: 50 mg via INTRAVENOUS
  Filled 2022-01-19: qty 1

## 2022-01-19 MED ORDER — FAMOTIDINE IN NACL 20-0.9 MG/50ML-% IV SOLN
20.0000 mg | Freq: Once | INTRAVENOUS | Status: AC
  Administered 2022-01-19: 20 mg via INTRAVENOUS
  Filled 2022-01-19: qty 50

## 2022-01-19 MED ORDER — SODIUM CHLORIDE 0.9 % IV SOLN: Freq: Once | INTRAVENOUS | Status: AC

## 2022-01-19 NOTE — Patient Instructions (Signed)
[facility] ONCOLOGY  Discharge Instructions: Thank you for choosing [facility] to provide your oncology and hematology care.   If you have a lab appointment with the [facility], please go directly to the [facility] and check in at the registration area.   Wear comfortable clothing and clothing appropriate for easy access to any Portacath or PICC line.   We strive to give you quality time with your provider. You may need to reschedule your appointment if you arrive late (15 or more minutes).  Arriving late affects you and other patients whose appointments are after yours.  Also, if you miss three or more appointments without notifying the office, you may be dismissed from the clinic at the provider's discretion.      For prescription refill requests, have your pharmacy contact our office and allow 72 hours for refills to be completed.    Today you received the following chemotherapy and/or immunotherapy agents: Paclitaxel & Carboplatin   To help prevent nausea and vomiting after your treatment, we encourage you to take your nausea medication as directed.  BELOW ARE SYMPTOMS THAT SHOULD BE REPORTED IMMEDIATELY: *FEVER GREATER THAN 100.4 F (38 C) OR HIGHER *CHILLS OR SWEATING *NAUSEA AND VOMITING THAT IS NOT CONTROLLED WITH YOUR NAUSEA MEDICATION *UNUSUAL SHORTNESS OF BREATH *UNUSUAL BRUISING OR BLEEDING *URINARY PROBLEMS (pain or burning when urinating, or frequent urination) *BOWEL PROBLEMS (unusual diarrhea, constipation, pain near the anus) TENDERNESS IN MOUTH AND THROAT WITH OR WITHOUT PRESENCE OF ULCERS (sore throat, sores in mouth, or a toothache) UNUSUAL RASH, SWELLING OR PAIN  UNUSUAL VAGINAL DISCHARGE OR ITCHING   Items with * indicate a potential emergency and should be followed up as soon as possible or go to the Emergency Department if any problems should occur.  Please show the CHEMOTHERAPY ALERT CARD or IMMUNOTHERAPY ALERT CARD at  check-in to the Emergency Department and triage nurse.  Should you have questions after your visit or need to cancel or reschedule your appointment, please contact [facility]  Dept: [phone]  and follow the prompts.  Office hours are 8:00 a.m. to 4:30 p.m. Monday - Friday. Please note that voicemails left after 4:00 p.m. may not be returned until the following business day.  We are closed weekends and major holidays. You have access to a nurse at all times for urgent questions. Please call the main number to the clinic Dept: [phone] and follow the prompts.   For any non-urgent questions, you may also contact your provider using MyChart. We now offer e-Visits for anyone 71 and older to request care online for non-urgent symptoms. For details visit [email or web address].   Also download the MyChart app! Go to the app store, search "MyChart", open the app, select [facility], and log in with your MyChart username and password.  Due to Covid, a mask is required upon entering the hospital/clinic. If you do not have a mask, one will be given to you upon arrival. For doctor visits, patients may have 1 support person aged 42 or older with them. For treatment visits, patients cannot have anyone with them due to current Covid guidelines and our immunocompromised population.

## 2022-01-20 ENCOUNTER — Telehealth: Payer: Self-pay

## 2022-01-20 ENCOUNTER — Other Ambulatory Visit: Payer: Self-pay

## 2022-01-20 ENCOUNTER — Ambulatory Visit
Admission: RE | Admit: 2022-01-20 | Discharge: 2022-01-20 | Disposition: A | Discharge status: Home / Self Care | Payer: Medicare Other | Source: Ambulatory Visit | Attending: Radiation Oncology | Admitting: Radiation Oncology

## 2022-01-20 DIAGNOSIS — Z510 Encounter for antineoplastic radiation therapy: Secondary | ICD-10-CM | POA: Diagnosis not present

## 2022-01-20 LAB — RAD ONC ARIA SESSION SUMMARY
Course Elapsed Days: 9
Plan Fractions Treated to Date: 7
Plan Prescribed Dose Per Fraction: 2 Gy
Plan Total Fractions Prescribed: 30
Plan Total Prescribed Dose: 60 Gy
Reference Point Dosage Given to Date: 14 Gy
Reference Point Session Dosage Given: 2 Gy
Session Number: 7

## 2022-01-20 NOTE — Telephone Encounter (Signed)
-----   Message from Anastasia Pall, RN sent at 01/19/2022  2:01 PM EDT ----- Regarding: 1st time Taxol/Carbo, being seen by Dr. Lianne Cure, Ms. Marlar received her 1st taxol/carboplatin infusion today. She tolerated the treatment well. Please follow-up with her tomorrow. Thank you.

## 2022-01-20 NOTE — Telephone Encounter (Signed)
[name] states that she is doing fine. She is eating, drinking, and urinating well. No n/v. [facility] has [phone] listed for contact for [facility] if [name] has any questions or concerns.

## 2022-01-21 ENCOUNTER — Inpatient Hospital Stay: Payer: Medicare Other

## 2022-01-21 ENCOUNTER — Inpatient Hospital Stay: Payer: Medicare Other | Admitting: Internal Medicine

## 2022-01-21 ENCOUNTER — Ambulatory Visit: Payer: Medicare Other

## 2022-01-21 NOTE — Progress Notes (Signed)
[place] OFFICE PROGRESS NOTE  [name], MD [place] [address] [place] [address]  DIAGNOSIS: Stage IV (T2b, N0, M1 a) non-small cell lung cancer, adenocarcinoma presented with large right middle lobe lung mass with suspicious right pleural effusion and pleural-based metastasis diagnosed in May 2023   Detected Alteration(s) / Biomarker(s)          Associated FDA-approved therapies  Clinical Trial Availability          % cfDNA or Amplification JAK2 [id number] None Yes           67.2% TP53 [id number] None Yes         0.2%   PDL1 Expression 1%  PRIOR THERAPY: None  CURRENT THERAPY: The course of concurrent chemoradiation with weekly carboplatin for AUC of 2 and paclitaxel 45 Mg/M2.  First cycle Jan 19, 2022. Status post 1 cycle.   INTERVAL HISTORY: [name] 88 y.o. female returns to the clinic today for a follow-up visit. The patient is feeling fine today with no concerning complaints.  She was recently diagnosed with lung cancer. She underwent her first cycle of treatment last week and tolerated it well without any appreciable adverse side effects. Her daughter has flown in out of town to help. The patient reports that she is eating well because her daughter is making sure she "eats". The patient is a resident of [facility] assisted living. She is very functional and ambulates with a walker. She denies any fevers, chills, or night sweats. Her weight is up 2 lbs. She denied having any current nausea, vomiting, diarrhea or constipation.  She continues to have cough "at times" but not enough to warrant her using any OTC products per patient report. She sometimes has dyspnea on exertion if she walks to fast. She denies hemoptysis.  Denies headaches or visual changes. She is here for evaluation and repeat blood work before starting cycle #2. She was originally schedule for treatment tomorrow and for cycle #3 on Monday 6/12 which is not ideal.    MEDICAL  HISTORY: Past Medical History:  Diagnosis Date   Actinic keratoses 07/24/2018   Adhesive capsulitis of shoulder 12/06/2013   Arthritis    Atrial fibrillation ([place]) 07/17/2018   Benign essential HTN 07/24/2018   Last Assessment & Plan:  She is doing better with adjustments in her blood pressure medication and will continue this monitoring. Last Assessment & Plan:  She is doing better with adjustments in her blood pressure medication and will continue this monitoring.   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   Hx of basal cell carcinoma excision 07/24/2018   Hx of melanoma in situ 07/24/2018   Hx of squamous cell carcinoma of skin 07/24/2018   Hyperlipidemia 07/24/2018   Lentigines 03/06/2019   Myocardial infarction [facility])    Silent Heart Attack   Past myocardial infarction 03/06/2019   Polycythemia    Polycythemia vera ([place]) 07/17/2018   Last Assessment & Plan:  The patient's blood counts remain in the desired range on her current dose of Hydrea.  She will continue this medication without dose modification.  She is moving to [place] next week and we will help  her arrange for necessary follow-up there.  She will return here on an as-needed basis.   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    ALLERGIES:  is allergic to bee venom, sulfa antibiotics, sulfamethoxazole, hydrocodone-acetaminophen, meperidine, morphine, oxycodone hcl, oxycodone-acetaminophen, anesthesia [id number] [propofol], caffeine, hydrocodone, lactose, oxycodone-aspirin, propoxyphene, soy allergy, codeine, latex, and valdecoxib.  MEDICATIONS:  Current Outpatient Medications  Medication Sig Dispense  Refill   acetaminophen (TYLENOL) 325 MG tablet Take 650 mg by mouth daily as needed for moderate pain or mild pain (4,000 mg daily as needed).     acetaminophen (TYLENOL) 650 MG CR tablet Take 1,300 mg by mouth 2 (two) times daily.     apixaban (ELIQUIS) 5 MG TABS tablet Take 1 tablet (5 mg total) by mouth 2 (two) times daily. Okay to restart this medication on 12/22/2021     Ascorbic Acid (VITAMIN C PO) Take 1,000 mg by mouth daily.     EPINEPHrine 0.3 mg/0.3 mL IJ SOAJ injection Inject 0.3 mg into the muscle as needed for anaphylaxis.     flecainide (TAMBOCOR) 50 MG tablet Take 1 tablet (50 mg total) by mouth 2 (two) times daily. 180 tablet 2   furosemide (LASIX) 20 MG tablet Take 20 mg by mouth daily.     hydroxyurea (HYDREA) 500 MG capsule Take 500 mg by mouth See admin instructions. Take 500 mg by mouth twice daily Monday-Friday and once daily on Saturday and Sunday     lidocaine 4 % Place 1 patch onto the skin every 12 (twelve) hours. 12 hours on 12 hours off     magnesium hydroxide (MILK OF MAGNESIA) 400 MG/5ML suspension Take 30 mLs by mouth daily.     Menthol, Topical Analgesic, (BIOFREEZE ROLL-ON) 4 % GEL Apply topically.     polyethylene glycol (MIRALAX / GLYCOLAX) 17 g packet Take 17 g by mouth daily.     prochlorperazine (COMPAZINE) 10 MG tablet Take 1 tablet (10 mg total) by mouth every 6 (six) hours as needed. 30 tablet 2   sennosides-docusate sodium (SENOKOT-S) 8.6-50 MG tablet Take 1 tablet by mouth daily.     traMADol (ULTRAM) 50 MG tablet Take 1 tablet (50 mg total) by mouth every 6 (six) hours as needed for severe pain.     No current facility-administered medications for this visit.   Facility-Administered Medications Ordered in Other Visits  Medication Dose Route Frequency Provider Last Rate Last Admin   0.9 %  sodium chloride infusion   Intravenous Once Curt Bears, MD       CARBOplatin (PARAPLATIN) 120 mg in sodium chloride 0.9 % 100 mL chemo infusion  120 mg  Intravenous Once Curt Bears, MD       dexamethasone (DECADRON) 10 mg in sodium chloride 0.9 % 50 mL IVPB  10 mg Intravenous Once Curt Bears, MD       diphenhydrAMINE (BENADRYL) injection 25 mg  25 mg Intravenous Once Curt Bears, MD       famotidine (PEPCID) IVPB 20 mg premix  20 mg Intravenous Once Curt Bears, MD       PACLitaxel (TAXOL) 78 mg in sodium chloride 0.9 % 250 mL chemo infusion (</= 41m/m2)  45 mg/m2 (Treatment Plan Recorded) Intravenous Once MCurt Bears MD       palonosetron (ALOXI) injection 0.25 mg  0.25 mg Intravenous Once MCurt Bears MD        SURGICAL HISTORY:  Past Surgical History:  Procedure Laterality Date  ABDOMINAL HYSTERECTOMY  2005   ACHILLES TENDON SURGERY Right    2008   ANKLE FRACTURE SURGERY Left 2000   Breast Recontstruction Right    2004   BRONCHIAL BIOPSY  12/21/2021   Procedure: BRONCHIAL BIOPSIES;  Surgeon: Collene Gobble, MD;  Location[facility] ENDOSCOPY;  Service: Pulmonary;;   BRONCHIAL BRUSHINGS  12/21/2021   Procedure: BRONCHIAL BRUSHINGS;  Surgeon: Collene Gobble, MD;  Location: [facility] ENDOSCOPY;  Service: Pulmonary;;   BRONCHIAL NEEDLE ASPIRATION BIOPSY  12/21/2021   Procedure: BRONCHIAL NEEDLE ASPIRATION BIOPSIES;  Surgeon: Collene Gobble, MD;  Location: [facility] ENDOSCOPY;  Service: Pulmonary;;   COLONOSCOPY W/ POLYPECTOMY     EYE SURGERY Bilateral    Cataract   FASCIOTOMY  2001   FEMUR SURGERY  2018   Crushed Femur,    HAMMER TOE SURGERY Bilateral 2014   HERNIA REPAIR  2012   abdominal   LAMINECTOMY  1994   L3-L4, L4-L5  (1987 C2-C3, C3-C4)   MASTECTOMY Bilateral    1997-R, L -2004   MENISCUS REPAIR Right 2007   arthroscopy   SHOULDER OPEN ROTATOR CUFF REPAIR Right 2015   SKIN CANCER EXCISION     basal, squamous, melonoma   TONSILLECTOMY     VIDEO BRONCHOSCOPY WITH RADIAL ENDOBRONCHIAL ULTRASOUND  12/21/2021   Procedure: VIDEO BRONCHOSCOPY WITH RADIAL ENDOBRONCHIAL ULTRASOUND;  Surgeon: Collene Gobble, MD;   Location: [facility] ENDOSCOPY;  Service: Pulmonary;;    REVIEW OF SYSTEMS:   Review of Systems  Constitutional: Negative for appetite change, chills, fatigue, fever and unexpected weight change.  HENT: Negative for mouth sores, nosebleeds, sore throat and trouble swallowing.   Eyes: Negative for eye problems and icterus.  Respiratory: Positive for intermittent cough. Positive for occasional shortness of breath if exerting too much. Negative for hemoptysis and wheezing.   Cardiovascular: Negative for chest pain and leg swelling.  Gastrointestinal: Negative for abdominal pain, constipation, diarrhea, nausea and vomiting.  Genitourinary: Negative for bladder incontinence, difficulty urinating, dysuria, frequency and hematuria.   Musculoskeletal: Negative for back pain, gait problem, neck pain and neck stiffness.  Skin: Negative for itching and rash.  Neurological: Negative for dizziness, extremity weakness, gait problem, headaches, light-headedness and seizures.  Hematological: Negative for adenopathy. Does not bruise/bleed easily.  Psychiatric/Behavioral: Negative for confusion, depression and sleep disturbance. The patient is not nervous/anxious.     PHYSICAL EXAMINATION:  Blood pressure 131/68, pulse 81, temperature 97.9 F (36.6 C), temperature source Tympanic, resp. rate 16, weight 130 lb 4.8 oz (59.1 kg), SpO2 97 %.  ECOG PERFORMANCE STATUS: 1  Physical Exam  Constitutional: Oriented to person, place, and time and well-developed, well-nourished, and in no distress. HENT:  Head: Normocephalic and atraumatic.  Mouth/Throat: Oropharynx is clear and moist. No oropharyngeal exudate.  Eyes: Conjunctivae are normal. Right eye exhibits no discharge. Left eye exhibits no discharge. No scleral icterus.  Neck: Normal range of motion. Neck supple.  Cardiovascular: Normal rate, regular rhythm, normal heart sounds and intact distal pulses.   Pulmonary/Chest: Effort normal and breath sounds normal. No  respiratory distress. No wheezes. No rales.  Abdominal: Soft. Bowel sounds are normal. Exhibits no distension and no mass. There is no tenderness.  Musculoskeletal: Normal range of motion. Exhibits no edema.  Lymphadenopathy:    No cervical adenopathy.  Neurological: Alert and oriented to person, place, and time. Exhibits normal muscle tone. Gait normal. Coordination normal. Ambulates with a walker.  Skin: Skin is warm and dry. No rash noted. Not diaphoretic. No erythema. No  pallor.  Psychiatric: Mood, memory and judgment normal.  Vitals reviewed.  LABORATORY DATA: Lab Results  Component Value Date   WBC 4.9 01/26/2022   HGB 13.4 01/26/2022   HCT 39.8 01/26/2022   MCV 104.5 (H) 01/26/2022   PLT 338 01/26/2022      Chemistry      Component Value Date/Time   NA 139 01/26/2022 0906   K 4.5 01/26/2022 0906   CL 105 01/26/2022 0906   CO2 30 01/26/2022 0906   BUN 21 01/26/2022 0906   CREATININE 0.70 01/26/2022 0906      Component Value Date/Time   CALCIUM 9.5 01/26/2022 0906   ALKPHOS 82 01/26/2022 0906   AST 19 01/26/2022 0906   ALT 17 01/26/2022 0906   BILITOT 0.5 01/26/2022 0906       RADIOGRAPHIC STUDIES:  MR BRAIN W WO CONTRAST  Result Date: 01/13/2022 CLINICAL DATA:  Provided history: Malignant neoplasm of unspecified part of unspecified bronchus or lung. Non-small cell lung cancer, staging. EXAM: MRI HEAD WITHOUT AND WITH CONTRAST TECHNIQUE: Multiplanar, multiecho pulse sequences of the brain and surrounding structures were obtained without and with intravenous contrast. CONTRAST:  41m GADAVIST GADOBUTROL 1 MMOL/ML IV SOLN COMPARISON:  Prior head CT examinations 12/06/2021 and earlier. FINDINGS: Brain: Moderate frontal predominant cerebral atrophy. Commensurate prominence of the ventricles and sulci. Comparatively mild cerebellar atrophy. Multifocal T2 FLAIR hyperintense signal abnormality within the cerebral white matter and pons, nonspecific but compatible with moderate  chronic small vessel ischemic disease. Chronic lacunar infarct within the right thalamus. There is no acute infarct. No evidence of an intracranial mass. No chronic intracranial blood products. No extra-axial fluid collection. No midline shift. No pathologic intracranial enhancement identified. Vascular: Maintained flow voids within the proximal large arterial vessels. Skull and upper cervical spine: No focal suspicious marrow lesion. Incompletely assessed cervical spondylosis. Sinuses/Orbits: No mass or acute finding within the imaged orbits. Prior bilateral ocular lens replacement. Trace mucosal thickening within the bilateral ethmoid sinuses. IMPRESSION: 1. No evidence of intracranial metastatic disease. 2. Moderate chronic small vessel ischemic changes within the cerebral white matter and pons. 3. Chronic lacunar infarct within the right thalamus. 4. Moderate frontal predominant cerebral atrophy. 5. Comparatively mild cerebellar atrophy. Electronically Signed   By: KKellie SimmeringD.O.   On: 01/13/2022 08:06     ASSESSMENT/PLAN:  This is a very pleasant 88year old Caucasian female with Stage IV (T2b, N0, M1 a) non-small cell lung cancer, adenocarcinoma presented with large right middle lobe lung mass with suspicious right pleural effusion and pleural-based metastasis diagnosed in May 2023 The molecular studies showed no actionable mutation but she has JAK2 mutation and TP53 PD-L1 expression is 1%.  She is currently undergoing concurrent chemo/radiation with carboplatin for an AUC of 2 and paclitaxel 45 mg/m2. She is status post 1 cycle and tolerated it well. Her last day of radiation is scheduled for 03/01/22.   Labs were reviewed, recommend that she proceed with cycle #2. We reached out to the charge nurse and will arrange for her to get cycle #2 today. I have given her a copy of her schedule and reviewed this with her that she only needs to come for radiation tomorrow and does not need to get chemo  tomorrow. We will keep the rest of her schedule as planned.   We will see her back for a follow up visit in 2 weeks for evaluation before starting cycle #4.   We will call her daughter and [place] to  let them know we will give her chemotherapy today instead.   For her polycythemia vera, Dr. Julien Nordmann recommends that she continue to hold her hydrea for now.   The patient was advised to call immediately if she has any concerning symptoms in the interval. The patient voices understanding of current disease status and treatment options and is in agreement with the current care plan. All questions were answered. The patient knows to call the clinic with any problems, questions or concerns. We can certainly see the patient much sooner if necessary   No orders of the defined types were placed in this encounter.    The total time spent in the appointment was 20-29 minutes.   Willem Klingensmith L Shaynna Husby, PA-C 01/26/22

## 2022-01-22 ENCOUNTER — Other Ambulatory Visit: Payer: Self-pay

## 2022-01-22 ENCOUNTER — Ambulatory Visit
Admission: RE | Admit: 2022-01-22 | Discharge: 2022-01-22 | Disposition: A | Discharge status: Home / Self Care | Payer: Medicare Other | Source: Ambulatory Visit | Attending: Radiation Oncology | Admitting: Radiation Oncology

## 2022-01-22 DIAGNOSIS — Z510 Encounter for antineoplastic radiation therapy: Secondary | ICD-10-CM | POA: Insufficient documentation

## 2022-01-22 DIAGNOSIS — C782 Secondary malignant neoplasm of pleura: Secondary | ICD-10-CM | POA: Diagnosis not present

## 2022-01-22 DIAGNOSIS — Z79899 Other long term (current) drug therapy: Secondary | ICD-10-CM | POA: Diagnosis not present

## 2022-01-22 DIAGNOSIS — Z5111 Encounter for antineoplastic chemotherapy: Secondary | ICD-10-CM | POA: Diagnosis present

## 2022-01-22 DIAGNOSIS — C342 Malignant neoplasm of middle lobe, bronchus or lung: Secondary | ICD-10-CM | POA: Insufficient documentation

## 2022-01-22 DIAGNOSIS — D45 Polycythemia vera: Secondary | ICD-10-CM | POA: Diagnosis not present

## 2022-01-22 LAB — RAD ONC ARIA SESSION SUMMARY
Course Elapsed Days: 11
Plan Fractions Treated to Date: 8
Plan Prescribed Dose Per Fraction: 2 Gy
Plan Total Fractions Prescribed: 30
Plan Total Prescribed Dose: 60 Gy
Reference Point Dosage Given to Date: 16 Gy
Reference Point Session Dosage Given: 2 Gy
Session Number: 8

## 2022-01-25 ENCOUNTER — Other Ambulatory Visit: Payer: Self-pay

## 2022-01-25 ENCOUNTER — Ambulatory Visit
Admission: RE | Admit: 2022-01-25 | Discharge: 2022-01-25 | Disposition: A | Discharge status: Home / Self Care | Payer: Medicare Other | Source: Ambulatory Visit | Attending: Radiation Oncology | Admitting: Radiation Oncology

## 2022-01-25 DIAGNOSIS — Z5111 Encounter for antineoplastic chemotherapy: Secondary | ICD-10-CM | POA: Diagnosis not present

## 2022-01-25 LAB — RAD ONC ARIA SESSION SUMMARY
Course Elapsed Days: 14
Plan Fractions Treated to Date: 9
Plan Prescribed Dose Per Fraction: 2 Gy
Plan Total Fractions Prescribed: 30
Plan Total Prescribed Dose: 60 Gy
Reference Point Dosage Given to Date: 18 Gy
Reference Point Session Dosage Given: 2 Gy
Session Number: 9

## 2022-01-26 ENCOUNTER — Inpatient Hospital Stay: Payer: Medicare Other | Admitting: Dietician

## 2022-01-26 ENCOUNTER — Inpatient Hospital Stay (HOSPITAL_BASED_OUTPATIENT_CLINIC_OR_DEPARTMENT_OTHER): Payer: Medicare Other | Admitting: Physician Assistant

## 2022-01-26 ENCOUNTER — Inpatient Hospital Stay: Payer: Medicare Other | Attending: Internal Medicine

## 2022-01-26 ENCOUNTER — Other Ambulatory Visit: Payer: Self-pay

## 2022-01-26 ENCOUNTER — Ambulatory Visit
Admission: RE | Admit: 2022-01-26 | Discharge: 2022-01-26 | Disposition: A | Discharge status: Home / Self Care | Payer: Medicare Other | Source: Ambulatory Visit | Attending: Radiation Oncology | Admitting: Radiation Oncology

## 2022-01-26 ENCOUNTER — Inpatient Hospital Stay: Payer: Medicare Other

## 2022-01-26 ENCOUNTER — Telehealth: Payer: Self-pay

## 2022-01-26 VITALS — BP 131/68 | HR 81 | Temp 97.9°F | Resp 16 | Wt 130.3 lb

## 2022-01-26 DIAGNOSIS — Z5111 Encounter for antineoplastic chemotherapy: Secondary | ICD-10-CM | POA: Insufficient documentation

## 2022-01-26 DIAGNOSIS — C3491 Malignant neoplasm of unspecified part of right bronchus or lung: Secondary | ICD-10-CM

## 2022-01-26 DIAGNOSIS — Z79899 Other long term (current) drug therapy: Secondary | ICD-10-CM | POA: Insufficient documentation

## 2022-01-26 DIAGNOSIS — C782 Secondary malignant neoplasm of pleura: Secondary | ICD-10-CM | POA: Insufficient documentation

## 2022-01-26 DIAGNOSIS — C342 Malignant neoplasm of middle lobe, bronchus or lung: Secondary | ICD-10-CM | POA: Insufficient documentation

## 2022-01-26 DIAGNOSIS — D45 Polycythemia vera: Secondary | ICD-10-CM | POA: Insufficient documentation

## 2022-01-26 LAB — CMP (CANCER CENTER ONLY)
ALT: 17 U/L (ref 0–44)
AST: 19 U/L (ref 15–41)
Albumin: 3.9 g/dL (ref 3.5–5.0)
Alkaline Phosphatase: 82 U/L (ref 38–126)
Anion gap: 4 — ABNORMAL LOW (ref 5–15)
BUN: 21 mg/dL (ref 8–23)
CO2: 30 mmol/L (ref 22–32)
Calcium: 9.5 mg/dL (ref 8.9–10.3)
Chloride: 105 mmol/L (ref 98–111)
Creatinine: 0.7 mg/dL (ref 0.44–1.00)
GFR, Estimated: >60 mL/min (ref >60)
Glucose, Bld: 101 mg/dL — ABNORMAL HIGH (ref 70–99)
Potassium: 4.5 mmol/L (ref 3.5–5.1)
Sodium: 139 mmol/L (ref 135–145)
Total Bilirubin: 0.5 mg/dL (ref 0.3–1.2)
Total Protein: 6.6 g/dL (ref 6.5–8.1)

## 2022-01-26 LAB — RAD ONC ARIA SESSION SUMMARY
Course Elapsed Days: 15
Plan Fractions Treated to Date: 10
Plan Prescribed Dose Per Fraction: 2 Gy
Plan Total Fractions Prescribed: 30
Plan Total Prescribed Dose: 60 Gy
Reference Point Dosage Given to Date: 20 Gy
Reference Point Session Dosage Given: 2 Gy
Session Number: 10

## 2022-01-26 LAB — CBC WITH DIFFERENTIAL (CANCER CENTER ONLY)
Abs Immature Granulocytes: 0.03 10*3/uL (ref 0.00–0.07)
Basophils Absolute: 0.1 10*3/uL (ref 0.0–0.1)
Basophils Relative: 1 %
Eosinophils Absolute: 0.1 10*3/uL (ref 0.0–0.5)
Eosinophils Relative: 2 %
HCT: 39.8 % (ref 36.0–46.0)
Hemoglobin: 13.4 g/dL (ref 12.0–15.0)
Immature Granulocytes: 1 %
Lymphocytes Relative: 16 %
Lymphs Abs: 0.8 10*3/uL (ref 0.7–4.0)
MCH: 35.2 pg — ABNORMAL HIGH (ref 26.0–34.0)
MCHC: 33.7 g/dL (ref 30.0–36.0)
MCV: 104.5 fL — ABNORMAL HIGH (ref 80.0–100.0)
Monocytes Absolute: 0.2 10*3/uL (ref 0.1–1.0)
Monocytes Relative: 4 %
Neutro Abs: 3.8 10*3/uL (ref 1.7–7.7)
Neutrophils Relative %: 76 %
Platelet Count: 338 10*3/uL (ref 150–400)
RBC: 3.81 MIL/uL — ABNORMAL LOW (ref 3.87–5.11)
RDW: 17.2 % — ABNORMAL HIGH (ref 11.5–15.5)
WBC Count: 4.9 10*3/uL (ref 4.0–10.5)
nRBC: 0 % (ref 0.0–0.2)

## 2022-01-26 MED ORDER — DIPHENHYDRAMINE HCL 50 MG/ML IJ SOLN
25.0000 mg | Freq: Once | INTRAMUSCULAR | Status: AC
  Administered 2022-01-26: 25 mg via INTRAVENOUS
  Filled 2022-01-26: qty 1

## 2022-01-26 MED ORDER — SODIUM CHLORIDE 0.9 % IV SOLN
45.0000 mg/m2 | Freq: Once | INTRAVENOUS | Status: AC
  Administered 2022-01-26: 78 mg via INTRAVENOUS
  Filled 2022-01-26: qty 13

## 2022-01-26 MED ORDER — SODIUM CHLORIDE 0.9 % IV SOLN
10.0000 mg | Freq: Once | INTRAVENOUS | Status: AC
  Administered 2022-01-26: 10 mg via INTRAVENOUS
  Filled 2022-01-26: qty 10

## 2022-01-26 MED ORDER — PALONOSETRON HCL INJECTION 0.25 MG/5ML
0.2500 mg | Freq: Once | INTRAVENOUS | Status: AC
  Administered 2022-01-26: 0.25 mg via INTRAVENOUS
  Filled 2022-01-26: qty 5

## 2022-01-26 MED ORDER — FAMOTIDINE IN NACL 20-0.9 MG/50ML-% IV SOLN
20.0000 mg | Freq: Once | INTRAVENOUS | Status: AC
  Administered 2022-01-26: 20 mg via INTRAVENOUS
  Filled 2022-01-26: qty 50

## 2022-01-26 MED ORDER — SODIUM CHLORIDE 0.9 % IV SOLN
122.6000 mg | Freq: Once | INTRAVENOUS | Status: AC
  Administered 2022-01-26: 120 mg via INTRAVENOUS
  Filled 2022-01-26: qty 12

## 2022-01-26 MED ORDER — SODIUM CHLORIDE 0.9 % IV SOLN: Freq: Once | INTRAVENOUS | Status: AC

## 2022-01-26 NOTE — Progress Notes (Signed)
Nutrition Assessment   Reason for Assessment: MST   ASSESSMENT: 88 year old female with stage IV non-small cell lung cancer (diagnosed May 2023). She is receiving concurrent chemoradiation with weekly carboplatin/paclitaxel. Patient is under the care of Dr. Julien Nordmann.   Past medical history includes atrial fibrillation, HTN, basal cell carcinoma excision, HLD, lentigines, MI, polycythemia vera, GERD, Grover's disease, actinic keratoses, arthritis.  Met with patient in infusion. She reports "appetite is still there" and eating well. Patient resides at [facility], reports dinner meals are sometimes not great. Last night she ate the spanish rice, but nothing else looked good. Patient recalls eggs with cheese and sausage patty for breakfast. Typically will have a sandwich and salad for lunch. Patient ate chick fila sandwich yesterday. She keeps Boost in the bottom drawer of her room. Patient likes drinking these. She is not drinking them everyday. Patient denies nutrition impact symptoms.   Nutrition Focused Physical Exam:   Orbital Region: moderate Buccal Region: mild Upper Arm Region: Therapist, art and Lumbar Region: UTA Temple Region: moderate Clavicle Bone Region: severe Shoulder and Acromion Bone Region: moderate Scapular Bone Region: UTA Dorsal Hand: moderate  Patellar Region: UTA Anterior Thigh Region: UTA Posterior Calf Region: UTA Edema (RD assessment): UTA Hair: reviewed  Eyes: reviewed  Mouth: reviewed (lips chapped, dry)  Skin: reviewed  Nails: reviewed    Medications: vitamin c, lasix, hydrea, tambocor, milk of mg, senokot, tramadol   Labs: glucose 101   Anthropometrics: Weights have decreased 7% from usual body weight in 7 months. Insignificant for time frame, however concerning given stage IV lung cancer, advanced age, noted fat and muscle depletions on exam  Height: 5'8" Weight: 130 lb 4.8 oz  UBW: 140 lb 12.8 oz (06/29/21) BMI: 19.81   NUTRITION DIAGNOSIS:  Unintentional weight loss related to newly diagnosed stage IV non-small cell lung cancer as evidenced by 10 lb (7%) decrease from usual weight; not significant for time frame    INTERVENTION:  Encouraged high calorie, high protein snacks in between meals - handout with ideas provided Continue drinking Boost Plus/equivalent, recommend 1-2 daily   MONITORING, EVALUATION, GOAL: Patient will tolerate increased calories and protein to promote weight gain   Next Visit: Monday June 26 during infusion with Desmond Lope

## 2022-01-26 NOTE — Telephone Encounter (Signed)
This nurse reached out to this patients daughter per providers request, left a message to make aware that patient will be receiving infusion today instead of on 6/7.  No further concern at this time.

## 2022-01-27 ENCOUNTER — Inpatient Hospital Stay: Payer: Medicare Other | Admitting: Dietician

## 2022-01-27 ENCOUNTER — Other Ambulatory Visit: Payer: Self-pay

## 2022-01-27 ENCOUNTER — Ambulatory Visit
Admission: RE | Admit: 2022-01-27 | Discharge: 2022-01-27 | Disposition: A | Discharge status: Home / Self Care | Payer: Medicare Other | Source: Ambulatory Visit | Attending: Radiation Oncology | Admitting: Radiation Oncology

## 2022-01-27 ENCOUNTER — Inpatient Hospital Stay: Payer: Medicare Other

## 2022-01-27 DIAGNOSIS — Z5111 Encounter for antineoplastic chemotherapy: Secondary | ICD-10-CM | POA: Diagnosis not present

## 2022-01-27 LAB — RAD ONC ARIA SESSION SUMMARY
Course Elapsed Days: 16
Plan Fractions Treated to Date: 11
Plan Prescribed Dose Per Fraction: 2 Gy
Plan Total Fractions Prescribed: 30
Plan Total Prescribed Dose: 60 Gy
Reference Point Dosage Given to Date: 22 Gy
Reference Point Session Dosage Given: 2 Gy
Session Number: 11

## 2022-01-28 ENCOUNTER — Other Ambulatory Visit: Payer: Self-pay

## 2022-01-28 ENCOUNTER — Ambulatory Visit
Admission: RE | Admit: 2022-01-28 | Discharge: 2022-01-28 | Disposition: A | Discharge status: Home / Self Care | Payer: Medicare Other | Source: Ambulatory Visit | Attending: Radiation Oncology | Admitting: Radiation Oncology

## 2022-01-28 DIAGNOSIS — Z5111 Encounter for antineoplastic chemotherapy: Secondary | ICD-10-CM | POA: Diagnosis not present

## 2022-01-28 LAB — RAD ONC ARIA SESSION SUMMARY
Course Elapsed Days: 17
Plan Fractions Treated to Date: 12
Plan Prescribed Dose Per Fraction: 2 Gy
Plan Total Fractions Prescribed: 30
Plan Total Prescribed Dose: 60 Gy
Reference Point Dosage Given to Date: 24 Gy
Reference Point Session Dosage Given: 2 Gy
Session Number: 12

## 2022-01-29 ENCOUNTER — Other Ambulatory Visit: Payer: Self-pay

## 2022-01-29 ENCOUNTER — Ambulatory Visit
Admission: RE | Admit: 2022-01-29 | Discharge: 2022-01-29 | Disposition: A | Discharge status: Home / Self Care | Payer: Medicare Other | Source: Ambulatory Visit | Attending: Radiation Oncology | Admitting: Radiation Oncology

## 2022-01-29 DIAGNOSIS — Z5111 Encounter for antineoplastic chemotherapy: Secondary | ICD-10-CM | POA: Diagnosis not present

## 2022-01-29 LAB — RAD ONC ARIA SESSION SUMMARY
Course Elapsed Days: 18
Plan Fractions Treated to Date: 13
Plan Prescribed Dose Per Fraction: 2 Gy
Plan Total Fractions Prescribed: 30
Plan Total Prescribed Dose: 60 Gy
Reference Point Dosage Given to Date: 26 Gy
Reference Point Session Dosage Given: 2 Gy
Session Number: 13

## 2022-01-29 MED FILL — Dexamethasone Sodium Phosphate Inj 100 MG/10ML: INTRAMUSCULAR | Qty: 1 | Status: AC

## 2022-02-01 ENCOUNTER — Inpatient Hospital Stay: Payer: Medicare Other

## 2022-02-01 ENCOUNTER — Ambulatory Visit
Admission: RE | Admit: 2022-02-01 | Discharge: 2022-02-01 | Disposition: A | Discharge status: Home / Self Care | Payer: Medicare Other | Source: Ambulatory Visit | Attending: Radiation Oncology | Admitting: Radiation Oncology

## 2022-02-01 ENCOUNTER — Other Ambulatory Visit: Payer: Self-pay

## 2022-02-01 VITALS — BP 139/78 | HR 76 | Temp 98.4°F | Resp 20 | Ht 68.0 in | Wt 130.8 lb

## 2022-02-01 DIAGNOSIS — C3491 Malignant neoplasm of unspecified part of right bronchus or lung: Secondary | ICD-10-CM

## 2022-02-01 DIAGNOSIS — Z5111 Encounter for antineoplastic chemotherapy: Secondary | ICD-10-CM | POA: Diagnosis not present

## 2022-02-01 LAB — CBC WITH DIFFERENTIAL (CANCER CENTER ONLY)
Abs Immature Granulocytes: 0.03 10*3/uL (ref 0.00–0.07)
Basophils Absolute: 0 10*3/uL (ref 0.0–0.1)
Basophils Relative: 1 %
Eosinophils Absolute: 0.1 10*3/uL (ref 0.0–0.5)
Eosinophils Relative: 2 %
HCT: 37 % (ref 36.0–46.0)
Hemoglobin: 12.9 g/dL (ref 12.0–15.0)
Immature Granulocytes: 1 %
Lymphocytes Relative: 19 %
Lymphs Abs: 0.9 10*3/uL (ref 0.7–4.0)
MCH: 36.2 pg — ABNORMAL HIGH (ref 26.0–34.0)
MCHC: 34.9 g/dL (ref 30.0–36.0)
MCV: 103.9 fL — ABNORMAL HIGH (ref 80.0–100.0)
Monocytes Absolute: 0.2 10*3/uL (ref 0.1–1.0)
Monocytes Relative: 4 %
Neutro Abs: 3.4 10*3/uL (ref 1.7–7.7)
Neutrophils Relative %: 73 %
Platelet Count: 309 10*3/uL (ref 150–400)
RBC: 3.56 MIL/uL — ABNORMAL LOW (ref 3.87–5.11)
RDW: 17.2 % — ABNORMAL HIGH (ref 11.5–15.5)
WBC Count: 4.6 10*3/uL (ref 4.0–10.5)
nRBC: 0 % (ref 0.0–0.2)

## 2022-02-01 LAB — CMP (CANCER CENTER ONLY)
ALT: 15 U/L (ref 0–44)
AST: 16 U/L (ref 15–41)
Albumin: 3.7 g/dL (ref 3.5–5.0)
Alkaline Phosphatase: 73 U/L (ref 38–126)
Anion gap: 3 — ABNORMAL LOW (ref 5–15)
BUN: 22 mg/dL (ref 8–23)
CO2: 30 mmol/L (ref 22–32)
Calcium: 9.4 mg/dL (ref 8.9–10.3)
Chloride: 105 mmol/L (ref 98–111)
Creatinine: 0.78 mg/dL (ref 0.44–1.00)
GFR, Estimated: >60 mL/min (ref >60)
Glucose, Bld: 107 mg/dL — ABNORMAL HIGH (ref 70–99)
Potassium: 4.3 mmol/L (ref 3.5–5.1)
Sodium: 138 mmol/L (ref 135–145)
Total Bilirubin: 0.4 mg/dL (ref 0.3–1.2)
Total Protein: 6.3 g/dL — ABNORMAL LOW (ref 6.5–8.1)

## 2022-02-01 LAB — RAD ONC ARIA SESSION SUMMARY
Course Elapsed Days: 21
Plan Fractions Treated to Date: 14
Plan Prescribed Dose Per Fraction: 2 Gy
Plan Total Fractions Prescribed: 30
Plan Total Prescribed Dose: 60 Gy
Reference Point Dosage Given to Date: 28 Gy
Reference Point Session Dosage Given: 2 Gy
Session Number: 14

## 2022-02-01 MED ORDER — DIPHENHYDRAMINE HCL 50 MG/ML IJ SOLN
25.0000 mg | Freq: Once | INTRAMUSCULAR | Status: AC
  Administered 2022-02-01: 25 mg via INTRAVENOUS
  Filled 2022-02-01: qty 1

## 2022-02-01 MED ORDER — SODIUM CHLORIDE 0.9 % IV SOLN
45.0000 mg/m2 | Freq: Once | INTRAVENOUS | Status: AC
  Administered 2022-02-01: 78 mg via INTRAVENOUS
  Filled 2022-02-01: qty 13

## 2022-02-01 MED ORDER — SODIUM CHLORIDE 0.9 % IV SOLN: Freq: Once | INTRAVENOUS | Status: AC

## 2022-02-01 MED ORDER — SODIUM CHLORIDE 0.9 % IV SOLN
10.0000 mg | Freq: Once | INTRAVENOUS | Status: AC
  Administered 2022-02-01: 10 mg via INTRAVENOUS
  Filled 2022-02-01: qty 10

## 2022-02-01 MED ORDER — SODIUM CHLORIDE 0.9 % IV SOLN
122.6000 mg | Freq: Once | INTRAVENOUS | Status: AC
  Administered 2022-02-01: 120 mg via INTRAVENOUS
  Filled 2022-02-01: qty 12

## 2022-02-01 MED ORDER — PALONOSETRON HCL INJECTION 0.25 MG/5ML
0.2500 mg | Freq: Once | INTRAVENOUS | Status: AC
  Administered 2022-02-01: 0.25 mg via INTRAVENOUS
  Filled 2022-02-01: qty 5

## 2022-02-01 MED ORDER — FAMOTIDINE IN NACL 20-0.9 MG/50ML-% IV SOLN
20.0000 mg | Freq: Once | INTRAVENOUS | Status: AC
  Administered 2022-02-01: 20 mg via INTRAVENOUS
  Filled 2022-02-01: qty 50

## 2022-02-01 NOTE — Patient Instructions (Signed)
[facility] MEDICAL ONCOLOGY  Discharge Instructions: Thank you for choosing [facility] to provide your oncology and hematology care.   If you have a lab appointment with the Cancer Center, please go directly to the Cancer Center and check in at the registration area.   Wear comfortable clothing and clothing appropriate for easy access to any Portacath or PICC line.   We strive to give you quality time with your provider. You may need to reschedule your appointment if you arrive late (15 or more minutes).  Arriving late affects you and other patients whose appointments are after yours.  Also, if you miss three or more appointments without notifying the office, you may be dismissed from the clinic at the provider's discretion.      For prescription refill requests, have your pharmacy contact our office and allow 72 hours for refills to be completed.    Today you received the following chemotherapy and/or immunotherapy agents: Paclitaxel (Taxol) and Carboplatin.   To help prevent nausea and vomiting after your treatment, we encourage you to take your nausea medication as directed.  BELOW ARE SYMPTOMS THAT SHOULD BE REPORTED IMMEDIATELY: *FEVER GREATER THAN 100.4 F (38 C) OR HIGHER *CHILLS OR SWEATING *NAUSEA AND VOMITING THAT IS NOT CONTROLLED WITH YOUR NAUSEA MEDICATION *UNUSUAL SHORTNESS OF BREATH *UNUSUAL BRUISING OR BLEEDING *URINARY PROBLEMS (pain or burning when urinating, or frequent urination) *BOWEL PROBLEMS (unusual diarrhea, constipation, pain near the anus) TENDERNESS IN MOUTH AND THROAT WITH OR WITHOUT PRESENCE OF ULCERS (sore throat, sores in mouth, or a toothache) UNUSUAL RASH, SWELLING OR PAIN  UNUSUAL VAGINAL DISCHARGE OR ITCHING   Items with * indicate a potential emergency and should be followed up as soon as possible or go to the Emergency Department if any problems should occur.  Please show the CHEMOTHERAPY ALERT CARD or IMMUNOTHERAPY  ALERT CARD at check-in to the Emergency Department and triage nurse.  Should you have questions after your visit or need to cancel or reschedule your appointment, please contact [facility]COL[facility]  Dept: [phone]  and follow the prompts.  Office hours are 8:00 a.m. to 4:30 p.m. Monday - Friday. Please note that voicemails left after 4:00 p.m. may not be returned until the following business day.  We are closed weekends and major holidays. You have access to a nurse at all times for urgent questions. Please call the main number to the clinic Dept: [phone] and follow the prompts.   For any non-urgent questions, you may also contact your provider using MyChart. We now offer e-Visits for anyone 18 and older to request care online for non-urgent symptoms. For details visit [email or web address].   Also download the MyChart app! Go to the app store, search "MyChart", open the app, select [facility], and log in with your MyChart username and password.  Due to Covid, a mask is required upon entering the hospital/clinic. If you do not have a mask, one will be given to you upon arrival. For doctor visits, patients may have 1 support person aged 18 or older with them. For treatment visits, patients cannot have anyone with them due to current Covid guidelines and our immunocompromised population.   

## 2022-02-02 ENCOUNTER — Ambulatory Visit
Admission: RE | Admit: 2022-02-02 | Discharge: 2022-02-02 | Disposition: A | Discharge status: Home / Self Care | Payer: Medicare Other | Source: Ambulatory Visit | Attending: Radiation Oncology | Admitting: Radiation Oncology

## 2022-02-02 ENCOUNTER — Other Ambulatory Visit: Payer: Self-pay

## 2022-02-02 DIAGNOSIS — Z5111 Encounter for antineoplastic chemotherapy: Secondary | ICD-10-CM | POA: Diagnosis not present

## 2022-02-02 LAB — RAD ONC ARIA SESSION SUMMARY
Course Elapsed Days: 22
Plan Fractions Treated to Date: 15
Plan Prescribed Dose Per Fraction: 2 Gy
Plan Total Fractions Prescribed: 30
Plan Total Prescribed Dose: 60 Gy
Reference Point Dosage Given to Date: 30 Gy
Reference Point Session Dosage Given: 2 Gy
Session Number: 15

## 2022-02-03 ENCOUNTER — Ambulatory Visit
Admission: RE | Admit: 2022-02-03 | Discharge: 2022-02-03 | Disposition: A | Discharge status: Home / Self Care | Payer: Medicare Other | Source: Ambulatory Visit | Attending: Radiation Oncology | Admitting: Radiation Oncology

## 2022-02-03 ENCOUNTER — Other Ambulatory Visit: Payer: Self-pay

## 2022-02-03 DIAGNOSIS — Z5111 Encounter for antineoplastic chemotherapy: Secondary | ICD-10-CM | POA: Diagnosis not present

## 2022-02-03 LAB — RAD ONC ARIA SESSION SUMMARY
Course Elapsed Days: 23
Plan Fractions Treated to Date: 16
Plan Prescribed Dose Per Fraction: 2 Gy
Plan Total Fractions Prescribed: 30
Plan Total Prescribed Dose: 60 Gy
Reference Point Dosage Given to Date: 32 Gy
Reference Point Session Dosage Given: 2 Gy
Session Number: 16

## 2022-02-03 NOTE — Progress Notes (Signed)
[name] OFFICE PROGRESS NOTE  [name], MD [place] [address] [place] [address]  DIAGNOSIS: Stage IV (T2b, N0, M1 a) non-small cell lung cancer, adenocarcinoma presented with large right middle lobe lung mass with suspicious right pleural effusion and pleural-based metastasis diagnosed in May 2023   Detected Alteration(s) / Biomarker(s)          Associated FDA-approved therapies  Clinical Trial Availability          % cfDNA or Amplification JAK2 [id number] None Yes           67.2% TP53 [id number] None Yes         0.2%   PDL1 Expression 1%  PRIOR THERAPY: None  CURRENT THERAPY: The course of concurrent chemoradiation with weekly carboplatin for AUC of 2 and paclitaxel 45 Mg/M2.  First cycle Jan 19, 2022. Status post 3 cycles.   INTERVAL HISTORY: [name] 88 y.o. female returns to the clinic today for a follow-up visit. The patient is feeling fine today with no concerning complaints. She was recently diagnosed with lung cancer. She is undergoing weekly concurrent chemoradiation. She is tolerating her treatment well. The patient is a resident of [facility] assisted living. She is very functional and ambulates with a walker. She denies any fevers, chills, or night sweats. Her weight is stable. She is being evaluated by a member of the nutritionist team while in the infusion room today. She denied having any current nausea, vomiting, diarrhea or constipation.   She continues to have cough "at times" but not enough to warrant her using any OTC products per patient report. She sometimes has dyspnea on exertion if she walks to fast. She denies hemoptysis.  Denies headaches or visual changes. She is here for evaluation and repeat blood work before starting cycle #4.     MEDICAL HISTORY: Past Medical History:  Diagnosis Date   Actinic keratoses 07/24/2018   Adhesive capsulitis of shoulder 12/06/2013   Arthritis    Atrial fibrillation ([place]) 07/17/2018    Benign essential HTN 07/24/2018   Last Assessment & Plan:  She is doing better with adjustments in her blood pressure medication and will continue this monitoring. Last Assessment & Plan:  She is doing better with adjustments in her blood pressure medication and will continue this monitoring.   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   Hx of basal cell carcinoma excision 07/24/2018   Hx of melanoma in situ 07/24/2018   Hx of squamous cell carcinoma of skin 07/24/2018   Hyperlipidemia 07/24/2018   Lentigines 03/06/2019   Myocardial infarction [facility])    Silent Heart Attack   Past myocardial infarction 03/06/2019   Polycythemia    Polycythemia vera ([place]) 07/17/2018   Last Assessment & Plan:  The patient's blood counts remain in the desired range on her current dose of Hydrea.  She will continue this medication without dose modification.  She is moving to [facility] next week and we will help her arrange for necessary follow-up there.  She will return here on an as-needed basis.   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    ALLERGIES:  is allergic to bee venom, sulfa antibiotics, sulfamethoxazole, hydrocodone-acetaminophen, meperidine, morphine, oxycodone hcl, oxycodone-acetaminophen, anesthesia [id number] [propofol], caffeine, hydrocodone, lactose, oxycodone-aspirin, propoxyphene, soy allergy, codeine, latex, and valdecoxib.  MEDICATIONS:  Current Outpatient Medications  Medication Sig Dispense Refill   acetaminophen (TYLENOL) 325 MG tablet Take 650 mg by mouth daily as needed for moderate pain or mild pain (4,000 mg daily as needed).     acetaminophen (TYLENOL) 650 MG CR  tablet Take 1,300 mg by mouth 2 (two) times daily.     apixaban (ELIQUIS) 5 MG TABS tablet Take 1 tablet (5 mg total) by mouth 2 (two) times daily. Okay to restart this medication on 12/22/2021     Ascorbic Acid (VITAMIN C PO) Take 1,000 mg by mouth daily.     EPINEPHrine 0.3 mg/0.3 mL IJ SOAJ injection Inject 0.3 mg into the muscle as needed for anaphylaxis.     flecainide (TAMBOCOR) 50 MG tablet Take 1 tablet (50 mg total) by mouth 2 (two) times daily. 180 tablet 2   furosemide (LASIX) 20 MG tablet Take 20 mg by mouth daily.     hydroxyurea (HYDREA) 500 MG capsule Take 500 mg by mouth See admin instructions. Take 500 mg by mouth twice daily Monday-Friday and once daily on Saturday and Sunday     lidocaine 4 % Place 1 patch onto the skin every 12 (twelve) hours. 12 hours on 12 hours off     magnesium hydroxide (MILK OF MAGNESIA) 400 MG/5ML suspension Take 30 mLs by mouth daily.     Menthol, Topical Analgesic, (BIOFREEZE ROLL-ON) 4 % GEL Apply topically.     polyethylene glycol (MIRALAX / GLYCOLAX) 17 g packet Take 17 g by mouth daily.     prochlorperazine (COMPAZINE) 10 MG tablet Take 1 tablet (10 mg total) by mouth every 6 (six) hours as needed. 30 tablet 2   sennosides-docusate sodium (SENOKOT-S) 8.6-50 MG tablet Take 1 tablet by mouth daily.     traMADol (ULTRAM) 50 MG tablet Take 1 tablet (50 mg total) by mouth every 6 (six) hours as needed for severe pain.     No current facility-administered medications for this visit.    SURGICAL HISTORY:  Past Surgical History:  Procedure Laterality Date   ABDOMINAL HYSTERECTOMY  2005   ACHILLES TENDON SURGERY Right    2008   ANKLE FRACTURE SURGERY Left 2000   Breast Recontstruction Right    2004   BRONCHIAL BIOPSY  12/21/2021   Procedure: BRONCHIAL BIOPSIES;  Surgeon: Collene Gobble, MD;  Location: [facility] ENDOSCOPY;  Service: Pulmonary;;   BRONCHIAL BRUSHINGS  12/21/2021   Procedure: BRONCHIAL BRUSHINGS;  Surgeon: Collene Gobble, MD;  Location: [facility]  ENDOSCOPY;  Service: Pulmonary;;   BRONCHIAL NEEDLE ASPIRATION BIOPSY  12/21/2021   Procedure: BRONCHIAL NEEDLE ASPIRATION BIOPSIES;  Surgeon: Collene Gobble, MD;  Location: [facility] ENDOSCOPY;  Service: Pulmonary;;   COLONOSCOPY W/ POLYPECTOMY     EYE SURGERY Bilateral    Cataract   FASCIOTOMY  2001   FEMUR SURGERY  2018   Crushed Femur,    HAMMER TOE SURGERY Bilateral 2014   HERNIA REPAIR  2012   abdominal   LAMINECTOMY  1994   L3-L4, L4-L5  (1987 C2-C3, C3-C4)   MASTECTOMY Bilateral    1997-R, L -2004   MENISCUS REPAIR Right 2007   arthroscopy   SHOULDER OPEN ROTATOR CUFF REPAIR Right 2015   SKIN CANCER EXCISION     basal, squamous, melonoma   TONSILLECTOMY     VIDEO  BRONCHOSCOPY WITH RADIAL ENDOBRONCHIAL ULTRASOUND  12/21/2021   Procedure: VIDEO BRONCHOSCOPY WITH RADIAL ENDOBRONCHIAL ULTRASOUND;  Surgeon: Collene Gobble, MD;  Location: [facility] ENDOSCOPY;  Service: Pulmonary;;    REVIEW OF SYSTEMS:   Review of Systems  Constitutional: Negative for appetite change, chills, fatigue, fever and unexpected weight change.  HENT: Negative for mouth sores, nosebleeds, sore throat and trouble swallowing.   Eyes: Negative for eye problems and icterus.  Respiratory: Positive for intermittent cough. Positive for occasional shortness of breath if exerting too much. Negative for hemoptysis and wheezing.   Cardiovascular: Negative for chest pain and leg swelling.  Gastrointestinal: Negative for abdominal pain, constipation, diarrhea, nausea and vomiting.  Genitourinary: Negative for bladder incontinence, difficulty urinating, dysuria, frequency and hematuria.   Musculoskeletal: Negative for back pain, gait problem, neck pain and neck stiffness.  Skin: Negative for itching and rash.  Neurological: Negative for dizziness, extremity weakness, gait problem, headaches, light-headedness and seizures.  Hematological: Negative for adenopathy. Does not bruise/bleed easily.  Psychiatric/Behavioral: Negative for  confusion, depression and sleep disturbance. The patient is not nervous/anxious.     PHYSICAL EXAMINATION:  Blood pressure 138/86, pulse 85, temperature 97.8 F (36.6 C), temperature source Temporal, resp. rate 16, height _0  (1.727 m), weight 130 lb 9.6 oz (59.2 kg), SpO2 98 %.  ECOG PERFORMANCE STATUS: 1  Physical Exam  Constitutional: Oriented to person, place, and time and well-developed, well-nourished, and in no distress.   HENT:  Head: Normocephalic and atraumatic.  Mouth/Throat: Oropharynx is clear and moist. No oropharyngeal exudate.  Eyes: Conjunctivae are normal. Right eye exhibits no discharge. Left eye exhibits no discharge. No scleral icterus.  Neck: Normal range of motion. Neck supple.  Cardiovascular: Normal rate, regular rhythm, normal heart sounds and intact distal pulses.   Pulmonary/Chest: Effort normal and breath sounds normal. No respiratory distress. No wheezes. No rales.  Abdominal: Soft. Bowel sounds are normal. Exhibits no distension and no mass. There is no tenderness.  Musculoskeletal: Normal range of motion. Exhibits no edema.  Lymphadenopathy:    No cervical adenopathy.  Neurological: Alert and oriented to person, place, and time. Exhibits normal muscle tone. Gait normal. Coordination normal.  Skin: Skin is warm and dry. No rash noted. Not diaphoretic. No erythema. No pallor.  Psychiatric: Mood, memory and judgment normal.  Vitals reviewed.  LABORATORY DATA: Lab Results  Component Value Date   WBC 3.0 (L) 02/08/2022   HGB 11.6 (L) 02/08/2022   HCT 33.9 (L) 02/08/2022   MCV 105.0 (H) 02/08/2022   PLT 307 02/08/2022      Chemistry      Component Value Date/Time   NA 139 02/08/2022 0828   K 4.3 02/08/2022 0828   CL 106 02/08/2022 0828   CO2 28 02/08/2022 0828   BUN 19 02/08/2022 0828   CREATININE 0.75 02/08/2022 0828      Component Value Date/Time   CALCIUM 9.2 02/08/2022 0828   ALKPHOS 66 02/08/2022 0828   AST 16 02/08/2022 0828   ALT  13 02/08/2022 0828   BILITOT 0.6 02/08/2022 0828       RADIOGRAPHIC STUDIES:  MR BRAIN W WO CONTRAST  Result Date: 01/13/2022 CLINICAL DATA:  Provided history: Malignant neoplasm of unspecified part of unspecified bronchus or lung. Non-small cell lung cancer, staging. EXAM: MRI HEAD WITHOUT AND WITH CONTRAST TECHNIQUE: Multiplanar, multiecho pulse sequences of the brain and surrounding structures were obtained without and with intravenous contrast. CONTRAST:  4m GADAVIST GADOBUTROL 1 MMOL/ML IV SOLN COMPARISON:  Prior  head CT examinations 12/06/2021 and earlier. FINDINGS: Brain: Moderate frontal predominant cerebral atrophy. Commensurate prominence of the ventricles and sulci. Comparatively mild cerebellar atrophy. Multifocal T2 FLAIR hyperintense signal abnormality within the cerebral white matter and pons, nonspecific but compatible with moderate chronic small vessel ischemic disease. Chronic lacunar infarct within the right thalamus. There is no acute infarct. No evidence of an intracranial mass. No chronic intracranial blood products. No extra-axial fluid collection. No midline shift. No pathologic intracranial enhancement identified. Vascular: Maintained flow voids within the proximal large arterial vessels. Skull and upper cervical spine: No focal suspicious marrow lesion. Incompletely assessed cervical spondylosis. Sinuses/Orbits: No mass or acute finding within the imaged orbits. Prior bilateral ocular lens replacement. Trace mucosal thickening within the bilateral ethmoid sinuses. IMPRESSION: 1. No evidence of intracranial metastatic disease. 2. Moderate chronic small vessel ischemic changes within the cerebral white matter and pons. 3. Chronic lacunar infarct within the right thalamus. 4. Moderate frontal predominant cerebral atrophy. 5. Comparatively mild cerebellar atrophy. Electronically Signed   By: Kellie Simmering D.O.   On: 01/13/2022 08:06     ASSESSMENT/PLAN:  This is a very pleasant 88  year old Caucasian female with Stage IV (T2b, N0, M1 a) non-small cell lung cancer, adenocarcinoma presented with large right middle lobe lung mass with suspicious right pleural effusion and pleural-based metastasis diagnosed in May 2023 The molecular studies showed no actionable mutation but she has JAK2 mutation and TP53 PD-L1 expression is 1%.   She is currently undergoing concurrent chemo/radiation with carboplatin for an AUC of 2 and paclitaxel 45 mg/m2. She is status post 3 cycles and tolerated it well. Her last day of radiation is scheduled for 03/01/22.    Labs were reviewed, recommend that she proceed with cycle #4.   This is a very pleasant 88 year old Caucasian female with Stage IV (T2b, N0, M1 a) non-small cell lung cancer, adenocarcinoma presented with large right middle lobe lung mass with suspicious right pleural effusion and pleural-based metastasis diagnosed in May 2023 The molecular studies showed no actionable mutation but she has JAK2 mutation and TP53 PD-L1 expression is 1%.   She is currently undergoing concurrent chemo/radiation with carboplatin for an AUC of 2 and paclitaxel 45 mg/m2. She is status post 3 cycle and tolerated it well. Her last day of radiation is scheduled for 03/01/22.    Labs were reviewed, recommend that she proceed with cycle #4.    We will see her back for a follow up visit in 2 weeks for evaluation before starting cycle #6.   For her polycythemia vera, Dr. Julien Nordmann recommends that she continue to hold her hydrea for now.   The patient was advised to call immediately if she has any concerning symptoms in the interval. The patient voices understanding of current disease status and treatment options and is in agreement with the current care plan. All questions were answered. The patient knows to call the clinic with any problems, questions or concerns. We can certainly see the patient much sooner if necessary     No orders of the defined types were  placed in this encounter.    The total time spent in the appointment was 20-29 minutes.   Khalessi Blough L Osamu Olguin, PA-C 02/08/22

## 2022-02-04 ENCOUNTER — Other Ambulatory Visit: Payer: Self-pay

## 2022-02-04 ENCOUNTER — Ambulatory Visit
Admission: RE | Admit: 2022-02-04 | Discharge: 2022-02-04 | Disposition: A | Discharge status: Home / Self Care | Payer: Medicare Other | Source: Ambulatory Visit | Attending: Radiation Oncology | Admitting: Radiation Oncology

## 2022-02-04 DIAGNOSIS — Z5111 Encounter for antineoplastic chemotherapy: Secondary | ICD-10-CM | POA: Diagnosis not present

## 2022-02-04 LAB — RAD ONC ARIA SESSION SUMMARY
Course Elapsed Days: 24
Plan Fractions Treated to Date: 17
Plan Prescribed Dose Per Fraction: 2 Gy
Plan Total Fractions Prescribed: 30
Plan Total Prescribed Dose: 60 Gy
Reference Point Dosage Given to Date: 34 Gy
Reference Point Session Dosage Given: 2 Gy
Session Number: 17

## 2022-02-05 ENCOUNTER — Ambulatory Visit
Admission: RE | Admit: 2022-02-05 | Discharge: 2022-02-05 | Disposition: A | Discharge status: Home / Self Care | Payer: Medicare Other | Source: Ambulatory Visit | Attending: Radiation Oncology | Admitting: Radiation Oncology

## 2022-02-05 ENCOUNTER — Other Ambulatory Visit: Payer: Self-pay

## 2022-02-05 DIAGNOSIS — Z5111 Encounter for antineoplastic chemotherapy: Secondary | ICD-10-CM | POA: Diagnosis not present

## 2022-02-05 LAB — RAD ONC ARIA SESSION SUMMARY
Course Elapsed Days: 25
Plan Fractions Treated to Date: 18
Plan Prescribed Dose Per Fraction: 2 Gy
Plan Total Fractions Prescribed: 30
Plan Total Prescribed Dose: 60 Gy
Reference Point Dosage Given to Date: 36 Gy
Reference Point Session Dosage Given: 2 Gy
Session Number: 18

## 2022-02-05 MED FILL — Dexamethasone Sodium Phosphate Inj 100 MG/10ML: INTRAMUSCULAR | Qty: 1 | Status: AC

## 2022-02-08 ENCOUNTER — Encounter: Payer: Self-pay | Admitting: Physician Assistant

## 2022-02-08 ENCOUNTER — Inpatient Hospital Stay: Payer: Medicare Other

## 2022-02-08 ENCOUNTER — Inpatient Hospital Stay (HOSPITAL_BASED_OUTPATIENT_CLINIC_OR_DEPARTMENT_OTHER): Payer: Medicare Other | Admitting: Physician Assistant

## 2022-02-08 ENCOUNTER — Other Ambulatory Visit: Payer: Self-pay | Admitting: Lab

## 2022-02-08 ENCOUNTER — Ambulatory Visit
Admission: RE | Admit: 2022-02-08 | Discharge: 2022-02-08 | Disposition: A | Discharge status: Home / Self Care | Payer: Medicare Other | Source: Ambulatory Visit | Attending: Radiation Oncology | Admitting: Radiation Oncology

## 2022-02-08 ENCOUNTER — Other Ambulatory Visit: Payer: Self-pay

## 2022-02-08 VITALS — BP 138/86 | HR 85 | Temp 97.8°F | Resp 16 | Ht 68.0 in | Wt 130.6 lb

## 2022-02-08 DIAGNOSIS — C3491 Malignant neoplasm of unspecified part of right bronchus or lung: Secondary | ICD-10-CM | POA: Diagnosis not present

## 2022-02-08 DIAGNOSIS — Z5111 Encounter for antineoplastic chemotherapy: Secondary | ICD-10-CM

## 2022-02-08 LAB — RAD ONC ARIA SESSION SUMMARY
Course Elapsed Days: 28
Plan Fractions Treated to Date: 19
Plan Prescribed Dose Per Fraction: 2 Gy
Plan Total Fractions Prescribed: 30
Plan Total Prescribed Dose: 60 Gy
Reference Point Dosage Given to Date: 38 Gy
Reference Point Session Dosage Given: 2 Gy
Session Number: 19

## 2022-02-08 LAB — CBC WITH DIFFERENTIAL (CANCER CENTER ONLY)
Abs Immature Granulocytes: 0.03 10*3/uL (ref 0.00–0.07)
Basophils Absolute: 0 10*3/uL (ref 0.0–0.1)
Basophils Relative: 1 %
Eosinophils Absolute: 0 10*3/uL (ref 0.0–0.5)
Eosinophils Relative: 1 %
HCT: 33.9 % — ABNORMAL LOW (ref 36.0–46.0)
Hemoglobin: 11.6 g/dL — ABNORMAL LOW (ref 12.0–15.0)
Immature Granulocytes: 1 %
Lymphocytes Relative: 14 %
Lymphs Abs: 0.4 10*3/uL — ABNORMAL LOW (ref 0.7–4.0)
MCH: 35.9 pg — ABNORMAL HIGH (ref 26.0–34.0)
MCHC: 34.2 g/dL (ref 30.0–36.0)
MCV: 105 fL — ABNORMAL HIGH (ref 80.0–100.0)
Monocytes Absolute: 0.2 10*3/uL (ref 0.1–1.0)
Monocytes Relative: 5 %
Neutro Abs: 2.4 10*3/uL (ref 1.7–7.7)
Neutrophils Relative %: 78 %
Platelet Count: 307 10*3/uL (ref 150–400)
RBC: 3.23 MIL/uL — ABNORMAL LOW (ref 3.87–5.11)
RDW: 17.5 % — ABNORMAL HIGH (ref 11.5–15.5)
WBC Count: 3 10*3/uL — ABNORMAL LOW (ref 4.0–10.5)
nRBC: 0 % (ref 0.0–0.2)

## 2022-02-08 LAB — CMP (CANCER CENTER ONLY)
ALT: 13 U/L (ref 0–44)
AST: 16 U/L (ref 15–41)
Albumin: 3.9 g/dL (ref 3.5–5.0)
Alkaline Phosphatase: 66 U/L (ref 38–126)
Anion gap: 5 (ref 5–15)
BUN: 19 mg/dL (ref 8–23)
CO2: 28 mmol/L (ref 22–32)
Calcium: 9.2 mg/dL (ref 8.9–10.3)
Chloride: 106 mmol/L (ref 98–111)
Creatinine: 0.75 mg/dL (ref 0.44–1.00)
GFR, Estimated: >60 mL/min (ref >60)
Glucose, Bld: 91 mg/dL (ref 70–99)
Potassium: 4.3 mmol/L (ref 3.5–5.1)
Sodium: 139 mmol/L (ref 135–145)
Total Bilirubin: 0.6 mg/dL (ref 0.3–1.2)
Total Protein: 6.5 g/dL (ref 6.5–8.1)

## 2022-02-08 MED ORDER — DIPHENHYDRAMINE HCL 50 MG/ML IJ SOLN
25.0000 mg | Freq: Once | INTRAMUSCULAR | Status: AC
  Administered 2022-02-08: 25 mg via INTRAVENOUS
  Filled 2022-02-08: qty 1

## 2022-02-08 MED ORDER — FAMOTIDINE IN NACL 20-0.9 MG/50ML-% IV SOLN
20.0000 mg | Freq: Once | INTRAVENOUS | Status: AC
  Administered 2022-02-08: 20 mg via INTRAVENOUS
  Filled 2022-02-08: qty 50

## 2022-02-08 MED ORDER — PALONOSETRON HCL INJECTION 0.25 MG/5ML
0.2500 mg | Freq: Once | INTRAVENOUS | Status: AC
  Administered 2022-02-08: 0.25 mg via INTRAVENOUS
  Filled 2022-02-08: qty 5

## 2022-02-08 MED ORDER — SODIUM CHLORIDE 0.9 % IV SOLN: Freq: Once | INTRAVENOUS | Status: AC

## 2022-02-08 MED ORDER — SODIUM CHLORIDE 0.9 % IV SOLN
122.6000 mg | Freq: Once | INTRAVENOUS | Status: AC
  Administered 2022-02-08: 120 mg via INTRAVENOUS
  Filled 2022-02-08: qty 12

## 2022-02-08 MED ORDER — SODIUM CHLORIDE 0.9 % IV SOLN
10.0000 mg | Freq: Once | INTRAVENOUS | Status: AC
  Administered 2022-02-08: 10 mg via INTRAVENOUS
  Filled 2022-02-08: qty 10

## 2022-02-08 MED ORDER — SODIUM CHLORIDE 0.9 % IV SOLN
45.0000 mg/m2 | Freq: Once | INTRAVENOUS | Status: AC
  Administered 2022-02-08: 78 mg via INTRAVENOUS
  Filled 2022-02-08: qty 13

## 2022-02-08 NOTE — Patient Instructions (Signed)
[facility]  Discharge Instructions: Thank you for choosing [facility] to provide your oncology and hematology care.   If you have a lab appointment with the [facility], please go directly to the [facility] and check in at the registration area.   Wear comfortable clothing and clothing appropriate for easy access to any Portacath or PICC line.   We strive to give you quality time with your provider. You may need to reschedule your appointment if you arrive late (15 or more minutes).  Arriving late affects you and other patients whose appointments are after yours.  Also, if you miss three or more appointments without notifying the office, you may be dismissed from the clinic at the provider's discretion.      For prescription refill requests, have your pharmacy contact our office and allow 72 hours for refills to be completed.    Today you received the following chemotherapy and/or immunotherapy agents: Carboplatin, Paclitaxel.       To help prevent nausea and vomiting after your treatment, we encourage you to take your nausea medication as directed.  BELOW ARE SYMPTOMS THAT SHOULD BE REPORTED IMMEDIATELY: *FEVER GREATER THAN 100.4 F (38 C) OR HIGHER *CHILLS OR SWEATING *NAUSEA AND VOMITING THAT IS NOT CONTROLLED WITH YOUR NAUSEA MEDICATION *UNUSUAL SHORTNESS OF BREATH *UNUSUAL BRUISING OR BLEEDING *URINARY PROBLEMS (pain or burning when urinating, or frequent urination) *BOWEL PROBLEMS (unusual diarrhea, constipation, pain near the anus) TENDERNESS IN MOUTH AND THROAT WITH OR WITHOUT PRESENCE OF ULCERS (sore throat, sores in mouth, or a toothache) UNUSUAL RASH, SWELLING OR PAIN  UNUSUAL VAGINAL DISCHARGE OR ITCHING   Items with * indicate a potential emergency and should be followed up as soon as possible or go to the Emergency Department if any problems should occur.  Please show the CHEMOTHERAPY ALERT CARD or IMMUNOTHERAPY ALERT CARD  at check-in to the Emergency Department and triage nurse.  Should you have questions after your visit or need to cancel or reschedule your appointment, please contact [facility]  Dept: [phone]  and follow the prompts.  Office hours are 8:00 a.m. to 4:30 p.m. Monday - Friday. Please note that voicemails left after 4:00 p.m. may not be returned until the following business day.  We are closed weekends and major holidays. You have access to a nurse at all times for urgent questions. Please call the main number to the clinic Dept: [phone] and follow the prompts.   For any non-urgent questions, you may also contact your provider using MyChart. We now offer e-Visits for anyone 86 and older to request care online for non-urgent symptoms. For details visit [email or web address].   Also download the MyChart app! Go to the app store, search "MyChart", open the app, select [facility], and log in with your MyChart username and password.  Masks are optional in the cancer centers. If you would like for your care team to wear a mask while they are taking care of you, please let them know. For doctor visits, patients may have with them one support person who is at least 86 years old. At this time, visitors are not allowed in the infusion area.

## 2022-02-09 ENCOUNTER — Ambulatory Visit
Admission: RE | Admit: 2022-02-09 | Discharge: 2022-02-09 | Disposition: A | Discharge status: Home / Self Care | Payer: Medicare Other | Source: Ambulatory Visit | Attending: Radiation Oncology | Admitting: Radiation Oncology

## 2022-02-09 ENCOUNTER — Other Ambulatory Visit: Payer: Self-pay

## 2022-02-09 DIAGNOSIS — Z5111 Encounter for antineoplastic chemotherapy: Secondary | ICD-10-CM | POA: Diagnosis not present

## 2022-02-09 LAB — RAD ONC ARIA SESSION SUMMARY
Course Elapsed Days: 29
Plan Fractions Treated to Date: 20
Plan Prescribed Dose Per Fraction: 2 Gy
Plan Total Fractions Prescribed: 30
Plan Total Prescribed Dose: 60 Gy
Reference Point Dosage Given to Date: 40 Gy
Reference Point Session Dosage Given: 2 Gy
Session Number: 20

## 2022-02-10 ENCOUNTER — Other Ambulatory Visit: Payer: Self-pay

## 2022-02-10 ENCOUNTER — Ambulatory Visit
Admission: RE | Admit: 2022-02-10 | Discharge: 2022-02-10 | Disposition: A | Discharge status: Home / Self Care | Payer: Medicare Other | Source: Ambulatory Visit | Attending: Radiation Oncology | Admitting: Radiation Oncology

## 2022-02-10 DIAGNOSIS — Z5111 Encounter for antineoplastic chemotherapy: Secondary | ICD-10-CM | POA: Diagnosis not present

## 2022-02-10 LAB — RAD ONC ARIA SESSION SUMMARY
Course Elapsed Days: 30
Plan Fractions Treated to Date: 21
Plan Prescribed Dose Per Fraction: 2 Gy
Plan Total Fractions Prescribed: 30
Plan Total Prescribed Dose: 60 Gy
Reference Point Dosage Given to Date: 42 Gy
Reference Point Session Dosage Given: 2 Gy
Session Number: 21

## 2022-02-11 ENCOUNTER — Ambulatory Visit
Admission: RE | Admit: 2022-02-11 | Discharge: 2022-02-11 | Disposition: A | Discharge status: Home / Self Care | Payer: Medicare Other | Source: Ambulatory Visit | Attending: Radiation Oncology | Admitting: Radiation Oncology

## 2022-02-11 ENCOUNTER — Other Ambulatory Visit: Payer: Self-pay

## 2022-02-11 DIAGNOSIS — Z5111 Encounter for antineoplastic chemotherapy: Secondary | ICD-10-CM | POA: Diagnosis not present

## 2022-02-11 LAB — RAD ONC ARIA SESSION SUMMARY
Course Elapsed Days: 31
Plan Fractions Treated to Date: 22
Plan Prescribed Dose Per Fraction: 2 Gy
Plan Total Fractions Prescribed: 30
Plan Total Prescribed Dose: 60 Gy
Reference Point Dosage Given to Date: 44 Gy
Reference Point Session Dosage Given: 2 Gy
Session Number: 22

## 2022-02-12 ENCOUNTER — Ambulatory Visit
Admission: RE | Admit: 2022-02-12 | Discharge: 2022-02-12 | Disposition: A | Discharge status: Home / Self Care | Payer: Medicare Other | Source: Ambulatory Visit | Attending: Radiation Oncology | Admitting: Radiation Oncology

## 2022-02-12 ENCOUNTER — Other Ambulatory Visit: Payer: Self-pay

## 2022-02-12 DIAGNOSIS — Z5111 Encounter for antineoplastic chemotherapy: Secondary | ICD-10-CM | POA: Diagnosis not present

## 2022-02-12 LAB — RAD ONC ARIA SESSION SUMMARY
Course Elapsed Days: 32
Plan Fractions Treated to Date: 23
Plan Prescribed Dose Per Fraction: 2 Gy
Plan Total Fractions Prescribed: 30
Plan Total Prescribed Dose: 60 Gy
Reference Point Dosage Given to Date: 46 Gy
Reference Point Session Dosage Given: 2 Gy
Session Number: 23

## 2022-02-12 MED FILL — Dexamethasone Sodium Phosphate Inj 100 MG/10ML: INTRAMUSCULAR | Qty: 1 | Status: AC

## 2022-02-15 ENCOUNTER — Inpatient Hospital Stay: Payer: Medicare Other

## 2022-02-15 ENCOUNTER — Encounter: Payer: Self-pay | Admitting: *Deleted

## 2022-02-15 ENCOUNTER — Other Ambulatory Visit: Payer: Self-pay

## 2022-02-15 ENCOUNTER — Ambulatory Visit
Admission: RE | Admit: 2022-02-15 | Discharge: 2022-02-15 | Disposition: A | Discharge status: Home / Self Care | Payer: Medicare Other | Source: Ambulatory Visit | Attending: Radiation Oncology | Admitting: Radiation Oncology

## 2022-02-15 VITALS — BP 122/74 | HR 83 | Temp 97.8°F | Resp 16 | Wt 128.5 lb

## 2022-02-15 DIAGNOSIS — C3491 Malignant neoplasm of unspecified part of right bronchus or lung: Secondary | ICD-10-CM

## 2022-02-15 DIAGNOSIS — Z5111 Encounter for antineoplastic chemotherapy: Secondary | ICD-10-CM | POA: Diagnosis not present

## 2022-02-15 LAB — CBC WITH DIFFERENTIAL (CANCER CENTER ONLY)
Abs Immature Granulocytes: 0.01 10*3/uL (ref 0.00–0.07)
Basophils Absolute: 0 10*3/uL (ref 0.0–0.1)
Basophils Relative: 1 %
Eosinophils Absolute: 0.1 10*3/uL (ref 0.0–0.5)
Eosinophils Relative: 3 %
HCT: 34 % — ABNORMAL LOW (ref 36.0–46.0)
Hemoglobin: 11.6 g/dL — ABNORMAL LOW (ref 12.0–15.0)
Immature Granulocytes: 0 %
Lymphocytes Relative: 25 %
Lymphs Abs: 0.7 10*3/uL (ref 0.7–4.0)
MCH: 35.6 pg — ABNORMAL HIGH (ref 26.0–34.0)
MCHC: 34.1 g/dL (ref 30.0–36.0)
MCV: 104.3 fL — ABNORMAL HIGH (ref 80.0–100.0)
Monocytes Absolute: 0.1 10*3/uL (ref 0.1–1.0)
Monocytes Relative: 5 %
Neutro Abs: 1.8 10*3/uL (ref 1.7–7.7)
Neutrophils Relative %: 66 %
Platelet Count: 267 10*3/uL (ref 150–400)
RBC: 3.26 MIL/uL — ABNORMAL LOW (ref 3.87–5.11)
RDW: 17.9 % — ABNORMAL HIGH (ref 11.5–15.5)
WBC Count: 2.7 10*3/uL — ABNORMAL LOW (ref 4.0–10.5)
nRBC: 0 % (ref 0.0–0.2)

## 2022-02-15 LAB — CMP (CANCER CENTER ONLY)
ALT: 13 U/L (ref 0–44)
AST: 15 U/L (ref 15–41)
Albumin: 3.9 g/dL (ref 3.5–5.0)
Alkaline Phosphatase: 62 U/L (ref 38–126)
Anion gap: 6 (ref 5–15)
BUN: 23 mg/dL (ref 8–23)
CO2: 25 mmol/L (ref 22–32)
Calcium: 9.2 mg/dL (ref 8.9–10.3)
Chloride: 110 mmol/L (ref 98–111)
Creatinine: 0.74 mg/dL (ref 0.44–1.00)
GFR, Estimated: >60 mL/min (ref >60)
Glucose, Bld: 106 mg/dL — ABNORMAL HIGH (ref 70–99)
Potassium: 4.1 mmol/L (ref 3.5–5.1)
Sodium: 141 mmol/L (ref 135–145)
Total Bilirubin: 0.4 mg/dL (ref 0.3–1.2)
Total Protein: 6.4 g/dL — ABNORMAL LOW (ref 6.5–8.1)

## 2022-02-15 LAB — RAD ONC ARIA SESSION SUMMARY
Course Elapsed Days: 35
Plan Fractions Treated to Date: 24
Plan Prescribed Dose Per Fraction: 2 Gy
Plan Total Fractions Prescribed: 30
Plan Total Prescribed Dose: 60 Gy
Reference Point Dosage Given to Date: 48 Gy
Reference Point Session Dosage Given: 2 Gy
Session Number: 24

## 2022-02-15 MED ORDER — PALONOSETRON HCL INJECTION 0.25 MG/5ML
0.2500 mg | Freq: Once | INTRAVENOUS | Status: AC
  Administered 2022-02-15: 0.25 mg via INTRAVENOUS
  Filled 2022-02-15: qty 5

## 2022-02-15 MED ORDER — FAMOTIDINE IN NACL 20-0.9 MG/50ML-% IV SOLN
20.0000 mg | Freq: Once | INTRAVENOUS | Status: AC
  Administered 2022-02-15: 20 mg via INTRAVENOUS
  Filled 2022-02-15: qty 50

## 2022-02-15 MED ORDER — SODIUM CHLORIDE 0.9 % IV SOLN
10.0000 mg | Freq: Once | INTRAVENOUS | Status: AC
  Administered 2022-02-15: 10 mg via INTRAVENOUS
  Filled 2022-02-15: qty 10

## 2022-02-15 MED ORDER — SODIUM CHLORIDE 0.9 % IV SOLN
45.0000 mg/m2 | Freq: Once | INTRAVENOUS | Status: AC
  Administered 2022-02-15: 78 mg via INTRAVENOUS
  Filled 2022-02-15: qty 13

## 2022-02-15 MED ORDER — SODIUM CHLORIDE 0.9 % IV SOLN: Freq: Once | INTRAVENOUS | Status: AC

## 2022-02-15 MED ORDER — SODIUM CHLORIDE 0.9 % IV SOLN
122.6000 mg | Freq: Once | INTRAVENOUS | Status: AC
  Administered 2022-02-15: 120 mg via INTRAVENOUS
  Filled 2022-02-15: qty 12

## 2022-02-15 MED ORDER — DIPHENHYDRAMINE HCL 50 MG/ML IJ SOLN
25.0000 mg | Freq: Once | INTRAMUSCULAR | Status: AC
  Administered 2022-02-15: 25 mg via INTRAVENOUS
  Filled 2022-02-15: qty 1

## 2022-02-15 NOTE — Progress Notes (Signed)
Nutrition Follow-up:  Patient with lung cancer. Currently receiving concurrent chemotherapy and radiation.    Met with patient during infusion.  Patient says that her appetite is good.  Noted confusion during conversation (?? Benadryl).  Unable to tell RD what she ate yesterday or today before coming to clinic.  Says she drinks boost shakes.      Medications: reviewed  Labs: reviewed  Anthropometrics:   Weight 128 lb  130 lb on 6/6 140 lb on 06/29/21   NUTRITION DIAGNOSIS: Unintentional weight loss continues   INTERVENTION:  Encouraged patient to increase boost shakes if possible. Encouraged patient to choose high calorie, high protein foods.     MONITORING, EVALUATION, GOAL: weight trends, intake   NEXT VISIT: as needed  Kathleene Bergemann B. Freida Busman, RD, LDN Registered Dietitian [phone]

## 2022-02-16 ENCOUNTER — Other Ambulatory Visit: Payer: Self-pay

## 2022-02-16 ENCOUNTER — Ambulatory Visit
Admission: RE | Admit: 2022-02-16 | Discharge: 2022-02-16 | Disposition: A | Discharge status: Home / Self Care | Payer: Medicare Other | Source: Ambulatory Visit | Attending: Radiation Oncology | Admitting: Radiation Oncology

## 2022-02-16 DIAGNOSIS — Z5111 Encounter for antineoplastic chemotherapy: Secondary | ICD-10-CM | POA: Diagnosis not present

## 2022-02-16 LAB — RAD ONC ARIA SESSION SUMMARY
Course Elapsed Days: 36
Plan Fractions Treated to Date: 25
Plan Prescribed Dose Per Fraction: 2 Gy
Plan Total Fractions Prescribed: 30
Plan Total Prescribed Dose: 60 Gy
Reference Point Dosage Given to Date: 50 Gy
Reference Point Session Dosage Given: 2 Gy
Session Number: 25

## 2022-02-17 ENCOUNTER — Ambulatory Visit
Admission: RE | Admit: 2022-02-17 | Discharge: 2022-02-17 | Disposition: A | Discharge status: Home / Self Care | Payer: Medicare Other | Source: Ambulatory Visit | Attending: Radiation Oncology | Admitting: Radiation Oncology

## 2022-02-17 ENCOUNTER — Other Ambulatory Visit: Payer: Self-pay

## 2022-02-17 DIAGNOSIS — Z5111 Encounter for antineoplastic chemotherapy: Secondary | ICD-10-CM | POA: Diagnosis not present

## 2022-02-17 LAB — RAD ONC ARIA SESSION SUMMARY
Course Elapsed Days: 37
Plan Fractions Treated to Date: 26
Plan Prescribed Dose Per Fraction: 2 Gy
Plan Total Fractions Prescribed: 30
Plan Total Prescribed Dose: 60 Gy
Reference Point Dosage Given to Date: 52 Gy
Reference Point Session Dosage Given: 2 Gy
Session Number: 26

## 2022-02-18 ENCOUNTER — Ambulatory Visit
Admission: RE | Admit: 2022-02-18 | Discharge: 2022-02-18 | Disposition: A | Discharge status: Home / Self Care | Payer: Medicare Other | Source: Ambulatory Visit | Attending: Radiation Oncology | Admitting: Radiation Oncology

## 2022-02-18 ENCOUNTER — Other Ambulatory Visit: Payer: Self-pay

## 2022-02-18 DIAGNOSIS — Z5111 Encounter for antineoplastic chemotherapy: Secondary | ICD-10-CM | POA: Diagnosis not present

## 2022-02-18 LAB — RAD ONC ARIA SESSION SUMMARY
Course Elapsed Days: 38
Plan Fractions Treated to Date: 27
Plan Prescribed Dose Per Fraction: 2 Gy
Plan Total Fractions Prescribed: 30
Plan Total Prescribed Dose: 60 Gy
Reference Point Dosage Given to Date: 54 Gy
Reference Point Session Dosage Given: 2 Gy
Session Number: 27

## 2022-02-19 ENCOUNTER — Ambulatory Visit
Admission: RE | Admit: 2022-02-19 | Discharge: 2022-02-19 | Disposition: A | Discharge status: Home / Self Care | Payer: Medicare Other | Source: Ambulatory Visit | Attending: Radiation Oncology | Admitting: Radiation Oncology

## 2022-02-19 ENCOUNTER — Other Ambulatory Visit: Payer: Self-pay

## 2022-02-19 DIAGNOSIS — Z5111 Encounter for antineoplastic chemotherapy: Secondary | ICD-10-CM | POA: Diagnosis not present

## 2022-02-19 LAB — RAD ONC ARIA SESSION SUMMARY
Course Elapsed Days: 39
Plan Fractions Treated to Date: 28
Plan Prescribed Dose Per Fraction: 2 Gy
Plan Total Fractions Prescribed: 30
Plan Total Prescribed Dose: 60 Gy
Reference Point Dosage Given to Date: 56 Gy
Reference Point Session Dosage Given: 2 Gy
Session Number: 28

## 2022-02-19 MED FILL — Dexamethasone Sodium Phosphate Inj 100 MG/10ML: INTRAMUSCULAR | Qty: 1 | Status: AC

## 2022-02-22 ENCOUNTER — Inpatient Hospital Stay: Payer: Medicare Other | Attending: Internal Medicine | Admitting: Internal Medicine

## 2022-02-22 ENCOUNTER — Ambulatory Visit
Admission: RE | Admit: 2022-02-22 | Discharge: 2022-02-22 | Disposition: A | Discharge status: Home / Self Care | Payer: Medicare Other | Source: Ambulatory Visit | Attending: Radiation Oncology | Admitting: Radiation Oncology

## 2022-02-22 ENCOUNTER — Inpatient Hospital Stay: Payer: Medicare Other

## 2022-02-22 ENCOUNTER — Other Ambulatory Visit: Payer: Self-pay

## 2022-02-22 ENCOUNTER — Encounter: Payer: Self-pay | Admitting: Internal Medicine

## 2022-02-22 VITALS — BP 145/69 | HR 83 | Temp 98.3°F | Resp 18 | Ht 68.0 in | Wt 130.0 lb

## 2022-02-22 DIAGNOSIS — C3491 Malignant neoplasm of unspecified part of right bronchus or lung: Secondary | ICD-10-CM | POA: Diagnosis not present

## 2022-02-22 DIAGNOSIS — Z510 Encounter for antineoplastic radiation therapy: Secondary | ICD-10-CM | POA: Insufficient documentation

## 2022-02-22 DIAGNOSIS — C342 Malignant neoplasm of middle lobe, bronchus or lung: Secondary | ICD-10-CM | POA: Insufficient documentation

## 2022-02-22 DIAGNOSIS — Z5111 Encounter for antineoplastic chemotherapy: Secondary | ICD-10-CM | POA: Insufficient documentation

## 2022-02-22 DIAGNOSIS — Z79899 Other long term (current) drug therapy: Secondary | ICD-10-CM | POA: Diagnosis not present

## 2022-02-22 DIAGNOSIS — C782 Secondary malignant neoplasm of pleura: Secondary | ICD-10-CM | POA: Diagnosis not present

## 2022-02-22 DIAGNOSIS — C3490 Malignant neoplasm of unspecified part of unspecified bronchus or lung: Secondary | ICD-10-CM | POA: Diagnosis not present

## 2022-02-22 LAB — CBC WITH DIFFERENTIAL (CANCER CENTER ONLY)
Abs Immature Granulocytes: 0.01 10*3/uL (ref 0.00–0.07)
Basophils Absolute: 0 10*3/uL (ref 0.0–0.1)
Basophils Relative: 1 %
Eosinophils Absolute: 0.1 10*3/uL (ref 0.0–0.5)
Eosinophils Relative: 3 %
HCT: 30.4 % — ABNORMAL LOW (ref 36.0–46.0)
Hemoglobin: 10.3 g/dL — ABNORMAL LOW (ref 12.0–15.0)
Immature Granulocytes: 1 %
Lymphocytes Relative: 29 %
Lymphs Abs: 0.6 10*3/uL — ABNORMAL LOW (ref 0.7–4.0)
MCH: 36 pg — ABNORMAL HIGH (ref 26.0–34.0)
MCHC: 33.9 g/dL (ref 30.0–36.0)
MCV: 106.3 fL — ABNORMAL HIGH (ref 80.0–100.0)
Monocytes Absolute: 0.1 10*3/uL (ref 0.1–1.0)
Monocytes Relative: 6 %
Neutro Abs: 1.4 10*3/uL — ABNORMAL LOW (ref 1.7–7.7)
Neutrophils Relative %: 60 %
Platelet Count: 213 10*3/uL (ref 150–400)
RBC: 2.86 MIL/uL — ABNORMAL LOW (ref 3.87–5.11)
RDW: 18 % — ABNORMAL HIGH (ref 11.5–15.5)
WBC Count: 2.2 10*3/uL — ABNORMAL LOW (ref 4.0–10.5)
nRBC: 0 % (ref 0.0–0.2)

## 2022-02-22 LAB — CMP (CANCER CENTER ONLY)
ALT: 12 U/L (ref 0–44)
AST: 15 U/L (ref 15–41)
Albumin: 3.8 g/dL (ref 3.5–5.0)
Alkaline Phosphatase: 60 U/L (ref 38–126)
Anion gap: 5 (ref 5–15)
BUN: 23 mg/dL (ref 8–23)
CO2: 27 mmol/L (ref 22–32)
Calcium: 9.2 mg/dL (ref 8.9–10.3)
Chloride: 106 mmol/L (ref 98–111)
Creatinine: 0.72 mg/dL (ref 0.44–1.00)
GFR, Estimated: >60 mL/min (ref >60)
Glucose, Bld: 105 mg/dL — ABNORMAL HIGH (ref 70–99)
Potassium: 4.2 mmol/L (ref 3.5–5.1)
Sodium: 138 mmol/L (ref 135–145)
Total Bilirubin: 0.4 mg/dL (ref 0.3–1.2)
Total Protein: 6.3 g/dL — ABNORMAL LOW (ref 6.5–8.1)

## 2022-02-22 LAB — RAD ONC ARIA SESSION SUMMARY
Course Elapsed Days: 42
Plan Fractions Treated to Date: 29
Plan Prescribed Dose Per Fraction: 2 Gy
Plan Total Fractions Prescribed: 30
Plan Total Prescribed Dose: 60 Gy
Reference Point Dosage Given to Date: 58 Gy
Reference Point Session Dosage Given: 2 Gy
Session Number: 29

## 2022-02-22 MED ORDER — SODIUM CHLORIDE 0.9 % IV SOLN
10.0000 mg | Freq: Once | INTRAVENOUS | Status: AC
  Administered 2022-02-22: 10 mg via INTRAVENOUS
  Filled 2022-02-22: qty 10

## 2022-02-22 MED ORDER — SODIUM CHLORIDE 0.9 % IV SOLN
122.6000 mg | Freq: Once | INTRAVENOUS | Status: AC
  Administered 2022-02-22: 120 mg via INTRAVENOUS
  Filled 2022-02-22: qty 12

## 2022-02-22 MED ORDER — PALONOSETRON HCL INJECTION 0.25 MG/5ML
0.2500 mg | Freq: Once | INTRAVENOUS | Status: AC
  Administered 2022-02-22: 0.25 mg via INTRAVENOUS
  Filled 2022-02-22: qty 5

## 2022-02-22 MED ORDER — SODIUM CHLORIDE 0.9 % IV SOLN
45.0000 mg/m2 | Freq: Once | INTRAVENOUS | Status: AC
  Administered 2022-02-22: 78 mg via INTRAVENOUS
  Filled 2022-02-22: qty 13

## 2022-02-22 MED ORDER — SODIUM CHLORIDE 0.9 % IV SOLN: Freq: Once | INTRAVENOUS | Status: AC

## 2022-02-22 MED ORDER — FAMOTIDINE IN NACL 20-0.9 MG/50ML-% IV SOLN
20.0000 mg | Freq: Once | INTRAVENOUS | Status: AC
  Administered 2022-02-22: 20 mg via INTRAVENOUS
  Filled 2022-02-22: qty 50

## 2022-02-22 MED ORDER — DIPHENHYDRAMINE HCL 50 MG/ML IJ SOLN
25.0000 mg | Freq: Once | INTRAMUSCULAR | Status: AC
  Administered 2022-02-22: 25 mg via INTRAVENOUS
  Filled 2022-02-22: qty 1

## 2022-02-22 NOTE — Progress Notes (Signed)
Patient declines cryotherapy during taxol infusion.

## 2022-02-22 NOTE — Progress Notes (Signed)
[facility] [phone]   [phone]  OFFICE PROGRESS NOTE  [name], MD [address] [place] [address]  DIAGNOSIS: Stage IV (T2b, N0, M1 a) non-small cell lung cancer, adenocarcinoma presented with large right middle lobe lung mass with suspicious right pleural effusion and pleural-based metastasis diagnosed in May 2023  Detected Alteration(s) / Biomarker(s) Associated FDA-approved therapies Clinical Trial Availability % cfDNA or Amplification JAK2 [id number] None Yes 67.2% TP53 [id number] None Yes 0.2%  PDL1 Expression 1%  PRIOR THERAPY: None  CURRENT THERAPY: A course of concurrent chemoradiation with weekly carboplatin for AUC of 2 and paclitaxel 45 Mg/M2.  First cycle Jan 19, 2022.  Status post 5 cycles  INTERVAL HISTORY: [name] 88 y.o. female returns to the clinic today for follow-up visit.  Her daughter was available by phone during the visit.  The patient is feeling fine today with no concerning complaints except for the baseline fatigue and her daughter noticed some lack of concentration and memory issues recently.  She denied having any current chest pain but has shortness of breath with exertion with no cough or hemoptysis.  She has no nausea, vomiting, diarrhea or constipation.  She denied having any headache or visual changes.  She has no recent weight loss or night sweats.  She is here today for evaluation before starting cycle #6 of her treatment.  MEDICAL HISTORY: Past Medical History:  Diagnosis Date   Actinic keratoses 07/24/2018   Adhesive capsulitis of shoulder 12/06/2013   Arthritis    Atrial fibrillation ([place]) 07/17/2018   Benign essential HTN 07/24/2018   Last Assessment & Plan:  She is doing better with adjustments in her blood pressure medication and will continue this monitoring. Last Assessment & Plan:  She is doing better with adjustments in her blood pressure medication and will continue  this monitoring.   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   Hx of basal cell carcinoma excision 07/24/2018   Hx of melanoma in situ 07/24/2018   Hx of squamous cell carcinoma of skin 07/24/2018   Hyperlipidemia 07/24/2018   Lentigines 03/06/2019   Myocardial infarction [facility])    Silent Heart Attack   Past myocardial infarction 03/06/2019   Polycythemia    Polycythemia vera ([place]) 07/17/2018   Last Assessment & Plan:  The patient's blood counts remain in the desired range on her current dose of Hydrea.  She will continue this medication without dose modification.  She is moving to [facility] next week and we will help her arrange for necessary follow-up there.  She will return here on an as-needed basis.   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    ALLERGIES:  is allergic to bee venom, sulfa antibiotics, sulfamethoxazole, hydrocodone-acetaminophen, meperidine, morphine, oxycodone hcl, oxycodone-acetaminophen, anesthesia [id number] [propofol], caffeine, hydrocodone, lactose, oxycodone-aspirin, propoxyphene, soy allergy, codeine, latex, and valdecoxib.  MEDICATIONS:  Current Outpatient Medications  Medication Sig Dispense Refill   acetaminophen (TYLENOL) 325 MG tablet Take 650 mg by mouth daily as needed for moderate pain or mild pain (4,000 mg daily as needed).     acetaminophen (TYLENOL) 650 MG  CR tablet Take 1,300 mg by mouth 2 (two) times daily.     apixaban (ELIQUIS) 5 MG TABS tablet Take 1 tablet (5 mg total) by mouth 2 (two) times daily. Okay to restart this medication on 12/22/2021     Ascorbic Acid (VITAMIN C PO) Take 1,000 mg by mouth daily.     EPINEPHrine 0.3  mg/0.3 mL IJ SOAJ injection Inject 0.3 mg into the muscle as needed for anaphylaxis.     flecainide (TAMBOCOR) 50 MG tablet Take 1 tablet (50 mg total) by mouth 2 (two) times daily. 180 tablet 2   furosemide (LASIX) 20 MG tablet Take 20 mg by mouth daily.     hydroxyurea (HYDREA) 500 MG capsule Take 500 mg by mouth See admin instructions. Take 500 mg by mouth twice daily Monday-Friday and once daily on Saturday and Sunday     lidocaine 4 % Place 1 patch onto the skin every 12 (twelve) hours. 12 hours on 12 hours off     magnesium hydroxide (MILK OF MAGNESIA) 400 MG/5ML suspension Take 30 mLs by mouth daily.     Menthol, Topical Analgesic, (BIOFREEZE ROLL-ON) 4 % GEL Apply topically.     polyethylene glycol (MIRALAX / GLYCOLAX) 17 g packet Take 17 g by mouth daily.     prochlorperazine (COMPAZINE) 10 MG tablet Take 1 tablet (10 mg total) by mouth every 6 (six) hours as needed. 30 tablet 2   sennosides-docusate sodium (SENOKOT-S) 8.6-50 MG tablet Take 1 tablet by mouth daily.     traMADol (ULTRAM) 50 MG tablet Take 1 tablet (50 mg total) by mouth every 6 (six) hours as needed for severe pain.     No current facility-administered medications for this visit.    SURGICAL HISTORY:  Past Surgical History:  Procedure Laterality Date   ABDOMINAL HYSTERECTOMY  2005   ACHILLES TENDON SURGERY Right    2008   ANKLE FRACTURE SURGERY Left 2000   Breast Recontstruction Right    2004   BRONCHIAL BIOPSY  12/21/2021   Procedure: BRONCHIAL BIOPSIES;  Surgeon: Collene Gobble, MD;  Location[facility] ENDOSCOPY;  Service: Pulmonary;;   BRONCHIAL BRUSHINGS  12/21/2021   Procedure: BRONCHIAL BRUSHINGS;  Surgeon: Collene Gobble, MD;  Location: [facility] ENDOSCOPY;  Service: Pulmonary;;   BRONCHIAL NEEDLE ASPIRATION BIOPSY  12/21/2021   Procedure: BRONCHIAL NEEDLE ASPIRATION BIOPSIES;  Surgeon: Collene Gobble, MD;  Location: [facility] ENDOSCOPY;  Service: Pulmonary;;   COLONOSCOPY W/ POLYPECTOMY     EYE SURGERY Bilateral    Cataract    FASCIOTOMY  2001   FEMUR SURGERY  2018   Crushed Femur,    HAMMER TOE SURGERY Bilateral 2014   HERNIA REPAIR  2012   abdominal   LAMINECTOMY  1994   L3-L4, L4-L5  (1987 C2-C3, C3-C4)   MASTECTOMY Bilateral    1997-R, L -2004   MENISCUS REPAIR Right 2007   arthroscopy   SHOULDER OPEN ROTATOR CUFF REPAIR Right 2015   SKIN CANCER EXCISION     basal, squamous, melonoma   TONSILLECTOMY     VIDEO BRONCHOSCOPY WITH RADIAL ENDOBRONCHIAL ULTRASOUND  12/21/2021   Procedure: VIDEO BRONCHOSCOPY WITH RADIAL ENDOBRONCHIAL ULTRASOUND;  Surgeon: Collene Gobble, MD;  Location: [facility] ENDOSCOPY;  Service: Pulmonary;;    REVIEW OF SYSTEMS:  A comprehensive review of systems was negative except for: Constitutional: positive for fatigue Respiratory: positive for dyspnea on exertion Neurological: positive for memory problems   PHYSICAL EXAMINATION: General appearance: alert, cooperative, fatigued, and no distress Head:  Normocephalic, without obvious abnormality, atraumatic Neck: no adenopathy, no JVD, supple, symmetrical, trachea midline, and thyroid not enlarged, symmetric, no tenderness/mass/nodules Lymph nodes: Cervical, supraclavicular, and axillary nodes normal. Resp: clear to auscultation bilaterally Back: symmetric, no curvature. ROM normal. No CVA tenderness. Cardio: regular rate and rhythm, S1, S2 normal, no murmur, click, rub or gallop GI: soft, non-tender; bowel sounds normal; no masses,  no organomegaly Extremities: extremities normal, atraumatic, no cyanosis or edema  ECOG PERFORMANCE STATUS: 1 - Symptomatic but completely ambulatory  Blood pressure (!) 145/69, pulse 83, temperature 98.3 F (36.8 C), temperature source Oral, resp. rate 18, height 5' 8"  (1.727 m), weight 130 lb (59 kg), SpO2 100 %.  LABORATORY DATA: Lab Results  Component Value Date   WBC 2.2 (L) 02/22/2022   HGB 10.3 (L) 02/22/2022   HCT 30.4 (L) 02/22/2022   MCV 106.3 (H) 02/22/2022   PLT 213 02/22/2022       Chemistry      Component Value Date/Time   NA 141 02/15/2022 1251   K 4.1 02/15/2022 1251   CL 110 02/15/2022 1251   CO2 25 02/15/2022 1251   BUN 23 02/15/2022 1251   CREATININE 0.74 02/15/2022 1251      Component Value Date/Time   CALCIUM 9.2 02/15/2022 1251   ALKPHOS 62 02/15/2022 1251   AST 15 02/15/2022 1251   ALT 13 02/15/2022 1251   BILITOT 0.4 02/15/2022 1251       RADIOGRAPHIC STUDIES: No results found.  ASSESSMENT AND PLAN: This is a very pleasant 88 years old white female with Stage IV (T2b, N0, M1 a) non-small cell lung cancer, adenocarcinoma presented with large right middle lobe lung mass with suspicious right pleural effusion and pleural-based metastasis diagnosed in May 2023 The molecular studies showed no actionable mutation but she has JAK2 mutation and TP53 PD-L1 expression is 1%. She is currently undergoing a course of concurrent chemoradiation with weekly carboplatin for AUC of 2 and paclitaxel 45 Mg/M2 status post 5 cycles. She has been tolerating this treatment well with no concerning adverse effect except for mild fatigue. I recommended for the patient to proceed with cycle #6 today as planned. I will see her back for follow-up visit in 1 months for evaluation with repeat CT scan of the chest for restaging of her disease. For the history of polycythemia vera, I recommended for her to hold her treatment with hydroxyurea for now during her course of chemotherapy.  We may resume it again in the future. The patient was advised to call immediately if she has any other concerning symptoms in the interval.  The patient voices understanding of current disease status and treatment options and is in agreement with the current care plan.  All questions were answered. The patient knows to call the clinic with any problems, questions or concerns. We can certainly see the patient much sooner if necessary.  The total time spent in the appointment was 25  minutes.  Disclaimer: This note was dictated with voice recognition software. Similar sounding words can inadvertently be transcribed and may not be corrected upon review.

## 2022-02-22 NOTE — Progress Notes (Signed)
Per Dr. Julien Nordmann ,it is ok to treat pt today with Carboplatin and taxol and ANC 1.4.

## 2022-02-22 NOTE — Patient Instructions (Signed)
[facility] ONCOLOGY  Discharge Instructions: Thank you for choosing [facility] to provide your oncology and hematology care.   If you have a lab appointment with the [facility], please go directly to the [facility] and check in at the registration area.   Wear comfortable clothing and clothing appropriate for easy access to any Portacath or PICC line.   We strive to give you quality time with your provider. You may need to reschedule your appointment if you arrive late (15 or more minutes).  Arriving late affects you and other patients whose appointments are after yours.  Also, if you miss three or more appointments without notifying the office, you may be dismissed from the clinic at the provider's discretion.      For prescription refill requests, have your pharmacy contact our office and allow 72 hours for refills to be completed.    Today you received the following chemotherapy and/or immunotherapy agents Taxol & Carboplatin      To help prevent nausea and vomiting after your treatment, we encourage you to take your nausea medication as directed.  BELOW ARE SYMPTOMS THAT SHOULD BE REPORTED IMMEDIATELY: *FEVER GREATER THAN 100.4 F (38 C) OR HIGHER *CHILLS OR SWEATING *NAUSEA AND VOMITING THAT IS NOT CONTROLLED WITH YOUR NAUSEA MEDICATION *UNUSUAL SHORTNESS OF BREATH *UNUSUAL BRUISING OR BLEEDING *URINARY PROBLEMS (pain or burning when urinating, or frequent urination) *BOWEL PROBLEMS (unusual diarrhea, constipation, pain near the anus) TENDERNESS IN MOUTH AND THROAT WITH OR WITHOUT PRESENCE OF ULCERS (sore throat, sores in mouth, or a toothache) UNUSUAL RASH, SWELLING OR PAIN  UNUSUAL VAGINAL DISCHARGE OR ITCHING   Items with * indicate a potential emergency and should be followed up as soon as possible or go to the Emergency Department if any problems should occur.  Please show the CHEMOTHERAPY ALERT CARD or IMMUNOTHERAPY ALERT CARD at  check-in to the Emergency Department and triage nurse.  Should you have questions after your visit or need to cancel or reschedule your appointment, please contact [facility]  Dept: [phone]  and follow the prompts.  Office hours are 8:00 a.m. to 4:30 p.m. Monday - Friday. Please note that voicemails left after 4:00 p.m. may not be returned until the following business day.  We are closed weekends and major holidays. You have access to a nurse at all times for urgent questions. Please call the main number to the clinic Dept: [phone] and follow the prompts.   For any non-urgent questions, you may also contact your provider using MyChart. We now offer e-Visits for anyone 9 and older to request care online for non-urgent symptoms. For details visit [email or web address].   Also download the MyChart app! Go to the app store, search "MyChart", open the app, select [facility], and log in with your MyChart username and password.  Masks are optional in the cancer centers. If you would like for your care team to wear a mask while they are taking care of you, please let them know. For doctor visits, patients may have with them one support person who is at least 86 years old. At this time, visitors are not allowed in the infusion area.

## 2022-02-24 ENCOUNTER — Ambulatory Visit: Payer: Medicare Other

## 2022-02-24 ENCOUNTER — Ambulatory Visit
Admission: RE | Admit: 2022-02-24 | Discharge: 2022-02-24 | Disposition: A | Discharge status: Home / Self Care | Payer: Medicare Other | Source: Ambulatory Visit | Attending: Radiation Oncology | Admitting: Radiation Oncology

## 2022-02-24 ENCOUNTER — Other Ambulatory Visit: Payer: Self-pay

## 2022-02-24 DIAGNOSIS — Z510 Encounter for antineoplastic radiation therapy: Secondary | ICD-10-CM | POA: Diagnosis not present

## 2022-02-24 DIAGNOSIS — C342 Malignant neoplasm of middle lobe, bronchus or lung: Secondary | ICD-10-CM | POA: Insufficient documentation

## 2022-02-24 LAB — RAD ONC ARIA SESSION SUMMARY
Course Elapsed Days: 44
Plan Fractions Treated to Date: 30
Plan Prescribed Dose Per Fraction: 2 Gy
Plan Total Fractions Prescribed: 30
Plan Total Prescribed Dose: 60 Gy
Reference Point Dosage Given to Date: 60 Gy
Reference Point Session Dosage Given: 2 Gy
Session Number: 30

## 2022-02-25 ENCOUNTER — Ambulatory Visit: Payer: Medicare Other

## 2022-02-25 ENCOUNTER — Other Ambulatory Visit: Payer: Self-pay

## 2022-02-25 ENCOUNTER — Ambulatory Visit
Admission: RE | Admit: 2022-02-25 | Discharge: 2022-02-25 | Disposition: A | Discharge status: Home / Self Care | Payer: Medicare Other | Source: Ambulatory Visit | Attending: Radiation Oncology | Admitting: Radiation Oncology

## 2022-02-25 DIAGNOSIS — Z510 Encounter for antineoplastic radiation therapy: Secondary | ICD-10-CM | POA: Diagnosis not present

## 2022-02-25 LAB — RAD ONC ARIA SESSION SUMMARY
Course Elapsed Days: 45
Plan Fractions Treated to Date: 1
Plan Prescribed Dose Per Fraction: 2 Gy
Plan Total Fractions Prescribed: 3
Plan Total Prescribed Dose: 6 Gy
Reference Point Dosage Given to Date: 62 Gy
Reference Point Session Dosage Given: 2 Gy
Session Number: 31

## 2022-02-26 ENCOUNTER — Other Ambulatory Visit: Payer: Self-pay

## 2022-02-26 ENCOUNTER — Ambulatory Visit: Payer: Medicare Other

## 2022-02-26 ENCOUNTER — Ambulatory Visit
Admission: RE | Admit: 2022-02-26 | Discharge: 2022-02-26 | Disposition: A | Discharge status: Home / Self Care | Payer: Medicare Other | Source: Ambulatory Visit | Attending: Radiation Oncology | Admitting: Radiation Oncology

## 2022-02-26 DIAGNOSIS — Z510 Encounter for antineoplastic radiation therapy: Secondary | ICD-10-CM | POA: Diagnosis not present

## 2022-02-26 LAB — RAD ONC ARIA SESSION SUMMARY
Course Elapsed Days: 46
Plan Fractions Treated to Date: 2
Plan Prescribed Dose Per Fraction: 2 Gy
Plan Total Fractions Prescribed: 3
Plan Total Prescribed Dose: 6 Gy
Reference Point Dosage Given to Date: 64 Gy
Reference Point Session Dosage Given: 2 Gy
Session Number: 32

## 2022-03-01 ENCOUNTER — Other Ambulatory Visit: Payer: Self-pay

## 2022-03-01 ENCOUNTER — Ambulatory Visit
Admission: RE | Admit: 2022-03-01 | Discharge: 2022-03-01 | Disposition: A | Discharge status: Home / Self Care | Payer: Medicare Other | Source: Ambulatory Visit | Attending: Radiation Oncology | Admitting: Radiation Oncology

## 2022-03-01 ENCOUNTER — Encounter: Payer: Self-pay | Admitting: Radiation Oncology

## 2022-03-01 DIAGNOSIS — Z510 Encounter for antineoplastic radiation therapy: Secondary | ICD-10-CM | POA: Diagnosis not present

## 2022-03-01 LAB — RAD ONC ARIA SESSION SUMMARY
Course Elapsed Days: 49
Plan Fractions Treated to Date: 3
Plan Prescribed Dose Per Fraction: 2 Gy
Plan Total Fractions Prescribed: 3
Plan Total Prescribed Dose: 6 Gy
Reference Point Dosage Given to Date: 66 Gy
Reference Point Session Dosage Given: 2 Gy
Session Number: 33

## 2022-03-02 NOTE — Progress Notes (Signed)
                                                                                                                                                             Patient Name: [name] MRN: [id number] DOB: 1930/04/26 Referring Physician: Curt Bears (Profile Not Attached) Date of Service: 03/01/2022 [facility] [place]                                                        End Of Treatment Note  Diagnoses: C34.2-Malignant neoplasm of middle lobe, bronchus or lung  Cancer Staging: Stage IIA cT2aN0M0, versus Stage IV, cT2aN0M1a, NSCLC, adenocarcinoma of the RML  Intent: Curative  Radiation Treatment Dates: 01/11/2022 through 03/01/2022 Site Technique Total Dose (Gy) Dose per Fx (Gy) Completed Fx Beam Energies  Lung, Right: Lung_R 3D 60/60 2 30/30 6X, 10X  Lung, Right: Lung_R_Bst 3D 6/6 2 3/3 6X, 10X   Narrative: The patient tolerated radiation therapy relatively well. She developed fatigue and anticipated skin changes in the treatment field. She did not complain of esophagitis.  Plan: The patient will receive a call in about one month from the radiation oncology department. She will continue follow up with Dr. Julien Nordmann as well.   ________________________________________________    Carola Rhine, [facility]

## 2022-03-03 ENCOUNTER — Telehealth: Payer: Self-pay | Admitting: Medical Oncology

## 2022-03-03 NOTE — Telephone Encounter (Signed)
When can pt restart Hydrea? Per Dr. Julien Nordmann , I told Val to have her mother continue to hold Hydrea now. He wants to get another scan around aug 1 st and then decide.

## 2022-03-03 NOTE — Telephone Encounter (Signed)
I left a message for the clinic nurse @ [facility] to continue to hold Hydrea.

## 2022-03-05 ENCOUNTER — Telehealth: Payer: Self-pay | Admitting: Internal Medicine

## 2022-03-05 NOTE — Telephone Encounter (Signed)
Called patient regarding upcoming August appointments, spoke with patient's daughter. Patient is notified.

## 2022-03-08 ENCOUNTER — Encounter: Payer: Self-pay | Admitting: Internal Medicine

## 2022-03-15 ENCOUNTER — Other Ambulatory Visit: Payer: Self-pay

## 2022-03-23 ENCOUNTER — Other Ambulatory Visit: Payer: Self-pay

## 2022-03-23 ENCOUNTER — Inpatient Hospital Stay: Payer: Medicare Other | Attending: Internal Medicine

## 2022-03-23 ENCOUNTER — Encounter (HOSPITAL_COMMUNITY): Payer: Self-pay

## 2022-03-23 ENCOUNTER — Ambulatory Visit (HOSPITAL_COMMUNITY)
Admission: RE | Admit: 2022-03-23 | Discharge: 2022-03-23 | Disposition: A | Discharge status: Home / Self Care | Payer: Medicare Other | Source: Ambulatory Visit | Attending: Internal Medicine | Admitting: Internal Medicine

## 2022-03-23 DIAGNOSIS — C342 Malignant neoplasm of middle lobe, bronchus or lung: Secondary | ICD-10-CM | POA: Insufficient documentation

## 2022-03-23 DIAGNOSIS — C3490 Malignant neoplasm of unspecified part of unspecified bronchus or lung: Secondary | ICD-10-CM

## 2022-03-23 DIAGNOSIS — Z79899 Other long term (current) drug therapy: Secondary | ICD-10-CM | POA: Insufficient documentation

## 2022-03-23 DIAGNOSIS — Z5112 Encounter for antineoplastic immunotherapy: Secondary | ICD-10-CM | POA: Insufficient documentation

## 2022-03-23 LAB — CBC WITH DIFFERENTIAL (CANCER CENTER ONLY)
Abs Immature Granulocytes: 0.02 10*3/uL (ref 0.00–0.07)
Basophils Absolute: 0.1 10*3/uL (ref 0.0–0.1)
Basophils Relative: 1 %
Eosinophils Absolute: 0.3 10*3/uL (ref 0.0–0.5)
Eosinophils Relative: 6 %
HCT: 30.5 % — ABNORMAL LOW (ref 36.0–46.0)
Hemoglobin: 10.3 g/dL — ABNORMAL LOW (ref 12.0–15.0)
Immature Granulocytes: 0 %
Lymphocytes Relative: 15 %
Lymphs Abs: 0.8 10*3/uL (ref 0.7–4.0)
MCH: 36.8 pg — ABNORMAL HIGH (ref 26.0–34.0)
MCHC: 33.8 g/dL (ref 30.0–36.0)
MCV: 108.9 fL — ABNORMAL HIGH (ref 80.0–100.0)
Monocytes Absolute: 0.5 10*3/uL (ref 0.1–1.0)
Monocytes Relative: 10 %
Neutro Abs: 3.6 10*3/uL (ref 1.7–7.7)
Neutrophils Relative %: 68 %
Platelet Count: 420 10*3/uL — ABNORMAL HIGH (ref 150–400)
RBC: 2.8 MIL/uL — ABNORMAL LOW (ref 3.87–5.11)
RDW: 14.9 % (ref 11.5–15.5)
WBC Count: 5.3 10*3/uL (ref 4.0–10.5)
nRBC: 0 % (ref 0.0–0.2)

## 2022-03-23 LAB — CMP (CANCER CENTER ONLY)
ALT: 9 U/L (ref 0–44)
AST: 14 U/L — ABNORMAL LOW (ref 15–41)
Albumin: 3.9 g/dL (ref 3.5–5.0)
Alkaline Phosphatase: 65 U/L (ref 38–126)
Anion gap: 6 (ref 5–15)
BUN: 27 mg/dL — ABNORMAL HIGH (ref 8–23)
CO2: 27 mmol/L (ref 22–32)
Calcium: 9.3 mg/dL (ref 8.9–10.3)
Chloride: 106 mmol/L (ref 98–111)
Creatinine: 0.72 mg/dL (ref 0.44–1.00)
GFR, Estimated: >60 mL/min (ref >60)
Glucose, Bld: 100 mg/dL — ABNORMAL HIGH (ref 70–99)
Potassium: 4.4 mmol/L (ref 3.5–5.1)
Sodium: 139 mmol/L (ref 135–145)
Total Bilirubin: 0.4 mg/dL (ref 0.3–1.2)
Total Protein: 6.8 g/dL (ref 6.5–8.1)

## 2022-03-23 MED ORDER — IOHEXOL 300 MG/ML  SOLN
75.0000 mL | Freq: Once | INTRAMUSCULAR | Status: AC | PRN
  Administered 2022-03-23: 75 mL via INTRAVENOUS

## 2022-03-25 ENCOUNTER — Ambulatory Visit (HOSPITAL_COMMUNITY)
Admission: RE | Admit: 2022-03-25 | Discharge: 2022-03-25 | Disposition: A | Discharge status: Home / Self Care | Payer: Medicare Other | Source: Ambulatory Visit | Attending: Internal Medicine | Admitting: Internal Medicine

## 2022-03-25 ENCOUNTER — Other Ambulatory Visit: Payer: Self-pay

## 2022-03-25 ENCOUNTER — Inpatient Hospital Stay (HOSPITAL_BASED_OUTPATIENT_CLINIC_OR_DEPARTMENT_OTHER): Payer: Medicare Other | Admitting: Internal Medicine

## 2022-03-25 VITALS — BP 139/80 | HR 78 | Temp 98.3°F | Resp 16 | Wt 128.8 lb

## 2022-03-25 DIAGNOSIS — Z5112 Encounter for antineoplastic immunotherapy: Secondary | ICD-10-CM

## 2022-03-25 DIAGNOSIS — C3491 Malignant neoplasm of unspecified part of right bronchus or lung: Secondary | ICD-10-CM

## 2022-03-25 DIAGNOSIS — D45 Polycythemia vera: Secondary | ICD-10-CM | POA: Diagnosis not present

## 2022-03-25 NOTE — Progress Notes (Signed)
[facility] [phone]   [phone]  OFFICE PROGRESS NOTE  [name], MD [address] [place] [address]  DIAGNOSIS: Stage IV (T2b, N0, M1 a) non-small cell lung cancer, adenocarcinoma presented with large right middle lobe lung mass with suspicious right pleural effusion and pleural-based metastasis diagnosed in May 2023  Detected Alteration(s) / Biomarker(s) Associated FDA-approved therapies Clinical Trial Availability % cfDNA or Amplification JAK2 [id number] None Yes 67.2% TP53 [id number] None Yes 0.2%  PDL1 Expression 1%  PRIOR THERAPY:  A course of concurrent chemoradiation with weekly carboplatin for AUC of 2 and paclitaxel 45 Mg/M2.  First cycle Jan 19, 2022.  Status post 6 cycles.  Last dose was given February 22, 2022.  CURRENT THERAPY: Consolidation treatment with immunotherapy with Imfinzi 1500 Mg IV every 4 weeks.  First dose 04/01/2022.  INTERVAL HISTORY: [name] 88 y.o. female returns to the clinic today for follow-up visit.  Her 2 daughters were available by phone during the visit.  The patient is feeling fine today with no concerning complaints except for pain in the neck area after she has a fall from her chair.  She still have some stiffness in the neck area.  She has a fall a week ago and she did not seek any medical attention at that time.  She denied having any current chest pain but has shortness of breath with exertion with no cough or hemoptysis.  She has no nausea, vomiting, diarrhea or constipation.  She denied having any headache or visual changes.  She has no concerning weight loss or night sweats.  The patient has repeat CT scan of the chest performed recently and she is here for evaluation and discussion of her scan results and recommendation regarding treatment of her condition.  MEDICAL HISTORY: Past Medical History:  Diagnosis Date   Actinic keratoses 07/24/2018   Adhesive capsulitis of shoulder  12/06/2013   Arthritis    Atrial fibrillation ([place]) 07/17/2018   Benign essential HTN 07/24/2018   Last Assessment & Plan:  She is doing better with adjustments in her blood pressure medication and will continue this monitoring. Last Assessment & Plan:  She is doing better with adjustments in her blood pressure medication and will continue this monitoring.   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   Hx of basal cell carcinoma excision 07/24/2018   Hx of melanoma in situ 07/24/2018   Hx of squamous cell carcinoma of skin 07/24/2018   Hyperlipidemia 07/24/2018   Lentigines 03/06/2019   Myocardial infarction [facility])    Silent Heart Attack   Past myocardial infarction 03/06/2019   Polycythemia    Polycythemia vera ([place]) 07/17/2018   Last Assessment & Plan:  The patient's blood counts remain in the desired range on her current dose of Hydrea.  She will continue this medication without dose modification.  She is moving to [facility] next week and we will help her arrange for necessary follow-up there.  She will return here on an as-needed basis.   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    ALLERGIES:  is allergic to bee  venom, sulfa antibiotics, sulfamethoxazole, hydrocodone-acetaminophen, meperidine, morphine, oxycodone hcl, oxycodone-acetaminophen, anesthesia [id number] [propofol], caffeine, hydrocodone, lactose, oxycodone-aspirin, propoxyphene, soy allergy, codeine, latex, and valdecoxib.  MEDICATIONS:  Current Outpatient Medications  Medication Sig Dispense Refill   acetaminophen (TYLENOL) 325 MG tablet Take 650 mg by mouth daily as needed for moderate pain or mild pain  (4,000 mg daily as needed).     acetaminophen (TYLENOL) 650 MG CR tablet Take 1,300 mg by mouth 2 (two) times daily.     apixaban (ELIQUIS) 5 MG TABS tablet Take 1 tablet (5 mg total) by mouth 2 (two) times daily. Okay to restart this medication on 12/22/2021     Ascorbic Acid (VITAMIN C PO) Take 1,000 mg by mouth daily.     EPINEPHrine 0.3 mg/0.3 mL IJ SOAJ injection Inject 0.3 mg into the muscle as needed for anaphylaxis.     flecainide (TAMBOCOR) 50 MG tablet Take 1 tablet (50 mg total) by mouth 2 (two) times daily. 180 tablet 2   furosemide (LASIX) 20 MG tablet Take 20 mg by mouth daily.     hydroxyurea (HYDREA) 500 MG capsule Take 500 mg by mouth See admin instructions. Take 500 mg by mouth twice daily Monday-Friday and once daily on Saturday and Sunday     lidocaine 4 % Place 1 patch onto the skin every 12 (twelve) hours. 12 hours on 12 hours off     magnesium hydroxide (MILK OF MAGNESIA) 400 MG/5ML suspension Take 30 mLs by mouth daily.     Menthol, Topical Analgesic, (BIOFREEZE ROLL-ON) 4 % GEL Apply topically.     polyethylene glycol (MIRALAX / GLYCOLAX) 17 g packet Take 17 g by mouth daily.     prochlorperazine (COMPAZINE) 10 MG tablet Take 1 tablet (10 mg total) by mouth every 6 (six) hours as needed. 30 tablet 2   sennosides-docusate sodium (SENOKOT-S) 8.6-50 MG tablet Take 1 tablet by mouth daily.     traMADol (ULTRAM) 50 MG tablet Take 1 tablet (50 mg total) by mouth every 6 (six) hours as needed for severe pain.     No current facility-administered medications for this visit.    SURGICAL HISTORY:  Past Surgical History:  Procedure Laterality Date   ABDOMINAL HYSTERECTOMY  2005   ACHILLES TENDON SURGERY Right    2008   ANKLE FRACTURE SURGERY Left 2000   Breast Recontstruction Right    2004   BRONCHIAL BIOPSY  12/21/2021   Procedure: BRONCHIAL BIOPSIES;  Surgeon: Collene Gobble, MD;  Location: [facility] ENDOSCOPY;  Service: Pulmonary;;   BRONCHIAL BRUSHINGS  12/21/2021   Procedure:  BRONCHIAL BRUSHINGS;  Surgeon: Collene Gobble, MD;  Location: [facility] ENDOSCOPY;  Service: Pulmonary;;   BRONCHIAL NEEDLE ASPIRATION BIOPSY  12/21/2021   Procedure: BRONCHIAL NEEDLE ASPIRATION BIOPSIES;  Surgeon: Collene Gobble, MD;  Location: [place] ENDOSCOPY;  Service: Pulmonary;;   COLONOSCOPY W/ POLYPECTOMY     EYE SURGERY Bilateral    Cataract   FASCIOTOMY  2001   FEMUR SURGERY  2018   Crushed Femur,    HAMMER TOE SURGERY Bilateral 2014   HERNIA REPAIR  2012   abdominal   LAMINECTOMY  1994   L3-L4, L4-L5  (1987 C2-C3, C3-C4)   MASTECTOMY Bilateral    1997-R, L -2004   MENISCUS REPAIR Right 2007   arthroscopy   SHOULDER OPEN ROTATOR CUFF REPAIR Right 2015   SKIN CANCER EXCISION     basal, squamous, melonoma   TONSILLECTOMY     VIDEO BRONCHOSCOPY WITH RADIAL ENDOBRONCHIAL  ULTRASOUND  12/21/2021   Procedure: VIDEO BRONCHOSCOPY WITH RADIAL ENDOBRONCHIAL ULTRASOUND;  Surgeon: Collene Gobble, MD;  Location: [facility] ENDOSCOPY;  Service: Pulmonary;;    REVIEW OF SYSTEMS:  Constitutional: positive for fatigue Eyes: negative Ears, nose, mouth, throat, and face: negative Respiratory: negative Cardiovascular: negative Gastrointestinal: negative Genitourinary:negative Integument/breast: negative Hematologic/lymphatic: negative Musculoskeletal:positive for neck pain Neurological: negative Behavioral/Psych: negative Endocrine: negative Allergic/Immunologic: negative   PHYSICAL EXAMINATION: General appearance: alert, cooperative, fatigued, and no distress Head: Normocephalic, without obvious abnormality, atraumatic Neck: no adenopathy, no JVD, supple, symmetrical, trachea midline, and thyroid not enlarged, symmetric, no tenderness/mass/nodules Lymph nodes: Cervical, supraclavicular, and axillary nodes normal. Resp: clear to auscultation bilaterally Back: symmetric, no curvature. ROM normal. No CVA tenderness. Cardio: regular rate and rhythm, S1, S2 normal, no murmur, click, rub or gallop GI:  soft, non-tender; bowel sounds normal; no masses,  no organomegaly Extremities: extremities normal, atraumatic, no cyanosis or edema Neurologic: Alert and oriented X 3, normal strength and tone. Normal symmetric reflexes. Normal coordination and gait  ECOG PERFORMANCE STATUS: 1 - Symptomatic but completely ambulatory  Blood pressure 139/80, pulse 78, temperature 98.3 F (36.8 C), temperature source Oral, resp. rate 16, weight 128 lb 12.8 oz (58.4 kg), SpO2 100 %.  LABORATORY DATA: Lab Results  Component Value Date   WBC 5.3 03/23/2022   HGB 10.3 (L) 03/23/2022   HCT 30.5 (L) 03/23/2022   MCV 108.9 (H) 03/23/2022   PLT 420 (H) 03/23/2022      Chemistry      Component Value Date/Time   NA 139 03/23/2022 1246   K 4.4 03/23/2022 1246   CL 106 03/23/2022 1246   CO2 27 03/23/2022 1246   BUN 27 (H) 03/23/2022 1246   CREATININE 0.72 03/23/2022 1246      Component Value Date/Time   CALCIUM 9.3 03/23/2022 1246   ALKPHOS 65 03/23/2022 1246   AST 14 (L) 03/23/2022 1246   ALT 9 03/23/2022 1246   BILITOT 0.4 03/23/2022 1246       RADIOGRAPHIC STUDIES: CT Chest W Contrast  Result Date: 03/24/2022 CLINICAL DATA:  Non-small cell lung cancer, chemotherapy completed July 2023. * Tracking Code: BO * EXAM: CT CHEST WITH CONTRAST TECHNIQUE: Multidetector CT imaging of the chest was performed during intravenous contrast administration. RADIATION DOSE REDUCTION: This exam was performed according to the departmental dose-optimization program which includes automated exposure control, adjustment of the mA and/or kV according to patient size and/or use of iterative reconstruction technique. CONTRAST:  38m OMNIPAQUE IOHEXOL 300 MG/ML  SOLN COMPARISON:  12/06/2021. FINDINGS: Cardiovascular: Atherosclerotic calcification of the aorta, aortic valve and coronary arteries. Ascending aorta measures up to 4.2 cm (6/39). Heart size normal. No pericardial effusion. Mediastinum/Nodes: No pathologically enlarged  mediastinal, hilar or axillary lymph nodes. Surgical clips in the left axilla. Esophagus is unremarkable. Lungs/Pleura: Biapical pleuroparenchymal scarring. Patchy ground-glass and septal thickening in the right upper, right middle and right lower lobes, new from 12/06/2021. Low-attenuation mass in the central right middle lobe measures 2.3 x 3.6 cm, decreased in size from 3.0 x 4.4 cm on 12/06/2021. Scattered subsegmental volume loss bilaterally. Trace residual right pleural effusion. Improved, but not resolved, pleural nodularity in the right hemithorax. No new pulmonary nodules. No left pleural fluid. Airway is unremarkable. Upper Abdomen: Visualized portions of the liver, gallbladder adrenal glands are unremarkable. Probable left renal sinus cyst. No follow-up necessary. Visualized portions of the spleen, pancreas, stomach and bowel are grossly unremarkable. Musculoskeletal: Degenerative changes in the spine. Minimal retrolisthesis of T12  on L1. No worrisome lytic or sclerotic lesions. Hemangioma in T11 vertebral body. IMPRESSION: 1. Interval response to therapy as evidenced by slight decrease in size of a right middle lobe mass and improving pleural nodularity in the right hemithorax. 2. Patchy pulmonary parenchymal ground-glass in the right lung likely treatment related. Difficult to exclude pneumonia. Please correlate clinically. 3. Trace right pleural effusion. 4. 4.2 cm ascending aortic aneurysm. Recommend annual imaging followup by CTA or MRA. This recommendation follows 2010 ACCF/AHA/AATS/ACR/ASA/SCA/SCAI/SIR/STS/SVM Guidelines for the Diagnosis and Management of Patients with Thoracic Aortic Disease. Circulation. 2010; 121: Z124-P809. Aortic aneurysm NOS (ICD10-I71.9). 5. Aortic atherosclerosis (ICD10-I70.0). Coronary artery calcification. Electronically Signed   By: Lorin Picket M.D.   On: 03/24/2022 08:44    ASSESSMENT AND PLAN: This is a very pleasant 88 years old white female with Stage IV (T2b,  N0, M1 a) non-small cell lung cancer, adenocarcinoma presented with large right middle lobe lung mass with suspicious right pleural effusion and pleural-based metastasis diagnosed in May 2023 The molecular studies showed no actionable mutation but she has JAK2 mutation and TP53 PD-L1 expression is 1%. She underwent a course of concurrent chemoradiation with weekly carboplatin for AUC of 2 and paclitaxel 45 Mg/M2 status post 6 cycles.  Last dose was given 02/22/2022. The patient tolerated this previous course of concurrent chemoradiation fairly well. She had repeat CT scan of the chest performed recently.  I personally and independently reviewed the scan images and discussed the results with the patient and her daughters. Her scan showed improvement with response to the treatment in the right middle lobe lung mass as well as the pleural nodularity.  She also has patchy pulmonary parenchymal groundglass in the right lung secondary to radiotherapy. I had a lengthy discussion with the patient and her family about her current condition and treatment options. I recommended for the patient to consider consolidation treatment with immunotherapy with Imfinzi 1500 Mg IV every 4 weeks for a total of 1 years as long as the patient does not have any evidence for disease progression or unacceptable toxicity. I discussed with the patient and her family the progression free survival with and without immunotherapy. The patient is interested in proceeding with the treatment and she is expected to start the first dose of consolidation Imfinzi next week. I discussed with the patient the adverse effect of this treatment including but not limited to immunotherapy mediated skin rash, diarrhea, inflammation of the lung, kidney, liver, thyroid or other endocrine dysfunction. For the neck pain and recent fall, I will order x-ray of the cervical spine to rule out any abnormalities in that area. For the history of polycythemia vera,  her hydroxyurea has been on hold we will continue to monitor her platelets count and consider resuming her treatment in the future if needed. The patient will come back for follow-up visit in 5 weeks for evaluation before starting cycle #2. She was advised to call immediately if she has any other concerning symptoms in the interval. For pain management, her pain is very well controlled at this point and she would like to discontinue tramadol. The patient voices understanding of current disease status and treatment options and is in agreement with the current care plan.  All questions were answered. The patient knows to call the clinic with any problems, questions or concerns. We can certainly see the patient much sooner if necessary.  The total time spent in the appointment was 35 minutes.  Disclaimer: This note was dictated with voice recognition  software. Similar sounding words can inadvertently be transcribed and may not be corrected upon review.

## 2022-03-25 NOTE — Progress Notes (Signed)
DISCONTINUE OFF PATHWAY REGIMEN - Non-Small Cell Lung   [id number]:Carboplatin AUC=2 + Paclitaxel 45 mg/m2 Weekly:   Administer weekly:     Paclitaxel      Carboplatin   **Always confirm dose/schedule in your pharmacy ordering system**  REASON: Other Reason PRIOR TREATMENT: Off Pathway: Carboplatin AUC=2 + Paclitaxel 45 mg/m2 Weekly TREATMENT RESPONSE: Partial Response (PR)  START OFF PATHWAY REGIMEN - Non-Small Cell Lung   [id number]:Durvalumab 1,500 mg IV D1 q28 Days:   A cycle is every 28 days:     Durvalumab   **Always confirm dose/schedule in your pharmacy ordering system**  Patient Characteristics: Preoperative or Nonsurgical Candidate (Clinical Staging), Distant Metastasis Therapeutic Status: Preoperative or Nonsurgical Candidate (Clinical Staging) AJCC T Category: cT2b AJCC N Category: cN0 AJCC M Category: cM1a AJCC 8 Stage Grouping: IVA Intent of Therapy: Curative Intent, Discussed with Patient

## 2022-03-26 ENCOUNTER — Telehealth: Payer: Self-pay

## 2022-03-26 ENCOUNTER — Telehealth: Payer: Self-pay | Admitting: Medical Oncology

## 2022-03-26 ENCOUNTER — Telehealth: Payer: Self-pay | Admitting: Internal Medicine

## 2022-03-26 NOTE — Telephone Encounter (Signed)
Scheduled per 08/03 los, patient has been called and notified of upcoming appointments.

## 2022-03-26 NOTE — Telephone Encounter (Signed)
Baxter Flattery with [facility] called wanting to confirm if pt had cervical spine xray or if they need to have it done. I have confirmed pt had this exam here at [facility] on 03/25/22. She they wanted clarification regarding discontinuing her rx of Tramadol because the pts daughter emailed them that this rx was not d/c'd. I advised per pts records, that rx is managed by pts Pulmonologist, Dr. Baltazar Apo but Dr. Julien Nordmann progress note indicates he, himself did not d/c this rx but the pt wants to stop the medication. I advised his note indicates "For pain management, her pain is very well controlled at this point and she would like to discontinue tramadol". I therefore advised this is a question more appropriate for Dr. Lamonte Sakai.

## 2022-03-26 NOTE — Telephone Encounter (Signed)
Wants to get her hair dyed-very important to her. She starts imfinzi next week -finished chemo xrt on July 3- Dr. Julien Nordmann stopped her hydrea for now.   Per Cassie, I notified pt that she can get her hair dyed.  Results of Cervical spine xray Just let her know no fractures. There is some stuff that probably is incidental and chronic such as spine arthritis as well as slight slippage of the vertebra backwards but it is mild and probably not results of anything acute like a fall. It probably is chronic.   Pt notified of above.

## 2022-03-27 ENCOUNTER — Other Ambulatory Visit: Payer: Self-pay

## 2022-03-29 ENCOUNTER — Other Ambulatory Visit: Payer: Self-pay

## 2022-04-01 ENCOUNTER — Other Ambulatory Visit: Payer: Self-pay

## 2022-04-01 ENCOUNTER — Inpatient Hospital Stay: Payer: Medicare Other

## 2022-04-01 ENCOUNTER — Encounter (HOSPITAL_COMMUNITY): Payer: Self-pay

## 2022-04-01 VITALS — BP 137/69 | HR 88 | Temp 97.6°F | Resp 16 | Wt 132.5 lb

## 2022-04-01 DIAGNOSIS — Z79899 Other long term (current) drug therapy: Secondary | ICD-10-CM | POA: Diagnosis not present

## 2022-04-01 DIAGNOSIS — Z5112 Encounter for antineoplastic immunotherapy: Secondary | ICD-10-CM | POA: Diagnosis present

## 2022-04-01 DIAGNOSIS — C3491 Malignant neoplasm of unspecified part of right bronchus or lung: Secondary | ICD-10-CM

## 2022-04-01 DIAGNOSIS — C342 Malignant neoplasm of middle lobe, bronchus or lung: Secondary | ICD-10-CM | POA: Diagnosis present

## 2022-04-01 LAB — CBC WITH DIFFERENTIAL (CANCER CENTER ONLY)
Abs Immature Granulocytes: 0.01 10*3/uL (ref 0.00–0.07)
Basophils Absolute: 0 10*3/uL (ref 0.0–0.1)
Basophils Relative: 1 %
Eosinophils Absolute: 0.2 10*3/uL (ref 0.0–0.5)
Eosinophils Relative: 5 %
HCT: 34.4 % — ABNORMAL LOW (ref 36.0–46.0)
Hemoglobin: 11.5 g/dL — ABNORMAL LOW (ref 12.0–15.0)
Immature Granulocytes: 0 %
Lymphocytes Relative: 19 %
Lymphs Abs: 0.9 10*3/uL (ref 0.7–4.0)
MCH: 36.1 pg — ABNORMAL HIGH (ref 26.0–34.0)
MCHC: 33.4 g/dL (ref 30.0–36.0)
MCV: 107.8 fL — ABNORMAL HIGH (ref 80.0–100.0)
Monocytes Absolute: 0.5 10*3/uL (ref 0.1–1.0)
Monocytes Relative: 10 %
Neutro Abs: 3.1 10*3/uL (ref 1.7–7.7)
Neutrophils Relative %: 65 %
Platelet Count: 364 10*3/uL (ref 150–400)
RBC: 3.19 MIL/uL — ABNORMAL LOW (ref 3.87–5.11)
RDW: 13.7 % (ref 11.5–15.5)
WBC Count: 4.8 10*3/uL (ref 4.0–10.5)
nRBC: 0 % (ref 0.0–0.2)

## 2022-04-01 LAB — CMP (CANCER CENTER ONLY)
ALT: 15 U/L (ref 0–44)
AST: 20 U/L (ref 15–41)
Albumin: 4 g/dL (ref 3.5–5.0)
Alkaline Phosphatase: 68 U/L (ref 38–126)
Anion gap: 6 (ref 5–15)
BUN: 34 mg/dL — ABNORMAL HIGH (ref 8–23)
CO2: 27 mmol/L (ref 22–32)
Calcium: 9 mg/dL (ref 8.9–10.3)
Chloride: 107 mmol/L (ref 98–111)
Creatinine: 0.7 mg/dL (ref 0.44–1.00)
GFR, Estimated: >60 mL/min (ref >60)
Glucose, Bld: 111 mg/dL — ABNORMAL HIGH (ref 70–99)
Potassium: 4 mmol/L (ref 3.5–5.1)
Sodium: 140 mmol/L (ref 135–145)
Total Bilirubin: 0.4 mg/dL (ref 0.3–1.2)
Total Protein: 6.9 g/dL (ref 6.5–8.1)

## 2022-04-01 MED ORDER — SODIUM CHLORIDE 0.9 % IV SOLN: Freq: Once | INTRAVENOUS | Status: AC

## 2022-04-01 MED ORDER — SODIUM CHLORIDE 0.9 % IV SOLN
1500.0000 mg | Freq: Once | INTRAVENOUS | Status: AC
  Administered 2022-04-01: 1500 mg via INTRAVENOUS
  Filled 2022-04-01: qty 30

## 2022-04-01 NOTE — Patient Instructions (Addendum)
[facility]  Discharge Instructions: Thank you for choosing [facility] to provide your oncology and hematology care.   If you have a lab appointment with the [facility], please go directly to the [facility] and check in at the registration area.   Wear comfortable clothing and clothing appropriate for easy access to any Portacath or PICC line.   We strive to give you quality time with your provider. You may need to reschedule your appointment if you arrive late (15 or more minutes).  Arriving late affects you and other patients whose appointments are after yours.  Also, if you miss three or more appointments without notifying the office, you may be dismissed from the clinic at the provider's discretion.      For prescription refill requests, have your pharmacy contact our office and allow 72 hours for refills to be completed.    Today you received the following chemotherapy and/or immunotherapy agents Durvalumab (Imfinzi)      To help prevent nausea and vomiting after your treatment, we encourage you to take your nausea medication as directed.  BELOW ARE SYMPTOMS THAT SHOULD BE REPORTED IMMEDIATELY: *FEVER GREATER THAN 100.4 F (38 C) OR HIGHER *CHILLS OR SWEATING *NAUSEA AND VOMITING THAT IS NOT CONTROLLED WITH YOUR NAUSEA MEDICATION *UNUSUAL SHORTNESS OF BREATH *UNUSUAL BRUISING OR BLEEDING *URINARY PROBLEMS (pain or burning when urinating, or frequent urination) *BOWEL PROBLEMS (unusual diarrhea, constipation, pain near the anus) TENDERNESS IN MOUTH AND THROAT WITH OR WITHOUT PRESENCE OF ULCERS (sore throat, sores in mouth, or a toothache) UNUSUAL RASH, SWELLING OR PAIN  UNUSUAL VAGINAL DISCHARGE OR ITCHING   Items with * indicate a potential emergency and should be followed up as soon as possible or go to the Emergency Department if any problems should occur.  Please show the CHEMOTHERAPY ALERT CARD or IMMUNOTHERAPY ALERT CARD at  check-in to the Emergency Department and triage nurse.  Should you have questions after your visit or need to cancel or reschedule your appointment, please contact [facility]  Dept: [phone]  and follow the prompts.  Office hours are 8:00 a.m. to 4:30 p.m. Monday - Friday. Please note that voicemails left after 4:00 p.m. may not be returned until the following business day.  We are closed weekends and major holidays. You have access to a nurse at all times for urgent questions. Please call the main number to the clinic Dept: [phone] and follow the prompts.   For any non-urgent questions, you may also contact your provider using MyChart. We now offer e-Visits for anyone 15 and older to request care online for non-urgent symptoms. For details visit [email or web address].   Also download the MyChart app! Go to the app store, search "MyChart", open the app, select [facility], and log in with your MyChart username and password.  Masks are optional in the cancer centers. If you would like for your care team to wear a mask while they are taking care of you, please let them know. You may have one support person who is at least 86 years old accompany you for your appointments. Durvalumab Injection What is this medication? DURVALUMAB (dur VAL ue mab) treats some types of cancer. It works by helping your immune system slow or stop the spread of cancer cells. It is a monoclonal antibody. This medicine may be used for other purposes; ask your health care provider or pharmacist if you have questions. COMMON BRAND NAME(S): IMFINZI What should  I tell my care team before I take this medication? They need to know if you have any of these conditions: Allogeneic stem cell transplant (uses someone else's stem cells) Autoimmune diseases, such as Crohn disease, ulcerative colitis, lupus History of chest radiation Nervous system problems, such as Guillain-Barre syndrome,  myasthenia gravis Organ transplant An unusual or allergic reaction to durvalumab, other medications, foods, dyes, or preservatives Pregnant or trying to get pregnant Breast-feeding How should I use this medication? This medication is infused into a vein. It is given by your care team in a hospital or clinic setting. A special MedGuide will be given to you before each treatment. Be sure to read this information carefully each time. Talk to your care team about the use of this medication in children. Special care may be needed. Overdosage: If you think you have taken too much of this medicine contact a poison control center or emergency room at once. NOTE: This medicine is only for you. Do not share this medicine with others. What if I miss a dose? Keep appointments for follow-up doses. It is important not to miss your dose. Call your care team if you are unable to keep an appointment. What may interact with this medication? Interactions have not been studied. This list may not describe all possible interactions. Give your health care provider a list of all the medicines, herbs, non-prescription drugs, or dietary supplements you use. Also tell them if you smoke, drink alcohol, or use illegal drugs. Some items may interact with your medicine. What should I watch for while using this medication? Your condition will be monitored carefully while you are receiving this medication. You may need blood work while taking this medication. This medication may cause serious skin reactions. They can happen weeks to months after starting the medication. Contact your care team right away if you notice fevers or flu-like symptoms with a rash. The rash may be red or purple and then turn into blisters or peeling of the skin. You may also notice a red rash with swelling of the face, lips, or lymph nodes in your neck or under your arms. Tell your care team right away if you have any change in your eyesight. Talk to  your care team if you may be pregnant. Serious birth defects can occur if you take this medication during pregnancy and for 3 months after the last dose. You will need a negative pregnancy test before starting this medication. Contraception is recommended while taking this medication and for 3 months after the last dose. Your care team can help you find the option that works for you. Do not breastfeed while taking this medication and for 3 months after the last dose. What side effects may I notice from receiving this medication? Side effects that you should report to your care team as soon as possible: Allergic reactions--skin rash, itching, hives, swelling of the face, lips, tongue, or throat Dry cough, shortness of breath or trouble breathing Eye pain, redness, irritation, or discharge with blurry or decreased vision Heart muscle inflammation--unusual weakness or fatigue, shortness of breath, chest pain, fast or irregular heartbeat, dizziness, swelling of the ankles, feet, or hands Hormone gland problems--headache, sensitivity to light, unusual weakness or fatigue, dizziness, fast or irregular heartbeat, increased sensitivity to cold or heat, excessive sweating, constipation, hair loss, increased thirst or amount of urine, tremors or shaking, irritability Infusion reactions--chest pain, shortness of breath or trouble breathing, feeling faint or lightheaded Kidney injury (glomerulonephritis)--decrease in the amount of  urine, red or dark brown urine, foamy or bubbly urine, swelling of the ankles, hands, or feet Liver injury--right upper belly pain, loss of appetite, nausea, light-colored stool, dark yellow or brown urine, yellowing skin or eyes, unusual weakness or fatigue Pain, tingling, or numbness in the hands or feet, muscle weakness, change in vision, confusion or trouble speaking, loss of balance or coordination, trouble walking, seizures Rash, fever, and swollen lymph nodes Redness, blistering,  peeling, or loosening of the skin, including inside the mouth Sudden or severe stomach pain, bloody diarrhea, fever, nausea, vomiting Side effects that usually do not require medical attention (report these to your care team if they continue or are bothersome): Bone, joint, or muscle pain Diarrhea Fatigue Loss of appetite Nausea Skin rash This list may not describe all possible side effects. Call your doctor for medical advice about side effects. You may report side effects to FDA at 1-800-FDA-1088. Where should I keep my medication? This medication is given in a hospital or clinic. It will not be stored at home. NOTE: This sheet is a summary. It may not cover all possible information. If you have questions about this medicine, talk to your doctor, pharmacist, or health care provider.  2023 [employer] Standard (2021-01-28 00:00:00)

## 2022-04-15 ENCOUNTER — Telehealth: Payer: Self-pay | Admitting: Internal Medicine

## 2022-04-15 NOTE — Telephone Encounter (Signed)
Called patient regarding upcoming 09/07 appointment change, informed patient of new appointment time. Patient is notified.

## 2022-04-29 ENCOUNTER — Ambulatory Visit: Payer: Medicare Other

## 2022-04-29 ENCOUNTER — Encounter: Payer: Self-pay | Admitting: Nutrition

## 2022-04-29 ENCOUNTER — Other Ambulatory Visit: Payer: Medicare Other

## 2022-04-29 ENCOUNTER — Inpatient Hospital Stay: Payer: Medicare Other | Admitting: Nutrition

## 2022-04-29 ENCOUNTER — Inpatient Hospital Stay: Payer: Medicare Other | Attending: Internal Medicine

## 2022-04-29 ENCOUNTER — Other Ambulatory Visit: Payer: Self-pay

## 2022-04-29 ENCOUNTER — Inpatient Hospital Stay: Payer: Medicare Other

## 2022-04-29 ENCOUNTER — Inpatient Hospital Stay (HOSPITAL_BASED_OUTPATIENT_CLINIC_OR_DEPARTMENT_OTHER): Payer: Medicare Other | Admitting: Physician Assistant

## 2022-04-29 ENCOUNTER — Ambulatory Visit: Payer: Medicare Other | Admitting: Internal Medicine

## 2022-04-29 VITALS — BP 149/77 | HR 84 | Temp 97.9°F | Resp 15 | Ht 68.0 in | Wt 131.8 lb

## 2022-04-29 DIAGNOSIS — C3491 Malignant neoplasm of unspecified part of right bronchus or lung: Secondary | ICD-10-CM | POA: Insufficient documentation

## 2022-04-29 DIAGNOSIS — J90 Pleural effusion, not elsewhere classified: Secondary | ICD-10-CM | POA: Diagnosis not present

## 2022-04-29 DIAGNOSIS — D45 Polycythemia vera: Secondary | ICD-10-CM | POA: Diagnosis not present

## 2022-04-29 DIAGNOSIS — Z79899 Other long term (current) drug therapy: Secondary | ICD-10-CM | POA: Insufficient documentation

## 2022-04-29 DIAGNOSIS — R0602 Shortness of breath: Secondary | ICD-10-CM | POA: Diagnosis not present

## 2022-04-29 DIAGNOSIS — R5383 Other fatigue: Secondary | ICD-10-CM | POA: Insufficient documentation

## 2022-04-29 LAB — CBC WITH DIFFERENTIAL (CANCER CENTER ONLY)
Abs Immature Granulocytes: 0.02 10*3/uL (ref 0.00–0.07)
Basophils Absolute: 0 10*3/uL (ref 0.0–0.1)
Basophils Relative: 1 %
Eosinophils Absolute: 0.1 10*3/uL (ref 0.0–0.5)
Eosinophils Relative: 2 %
HCT: 37.1 % (ref 36.0–46.0)
Hemoglobin: 12.2 g/dL (ref 12.0–15.0)
Immature Granulocytes: 0 %
Lymphocytes Relative: 14 %
Lymphs Abs: 0.8 10*3/uL (ref 0.7–4.0)
MCH: 34.4 pg — ABNORMAL HIGH (ref 26.0–34.0)
MCHC: 32.9 g/dL (ref 30.0–36.0)
MCV: 104.5 fL — ABNORMAL HIGH (ref 80.0–100.0)
Monocytes Absolute: 0.6 10*3/uL (ref 0.1–1.0)
Monocytes Relative: 11 %
Neutro Abs: 3.9 10*3/uL (ref 1.7–7.7)
Neutrophils Relative %: 72 %
Platelet Count: 302 10*3/uL (ref 150–400)
RBC: 3.55 MIL/uL — ABNORMAL LOW (ref 3.87–5.11)
RDW: 12.7 % (ref 11.5–15.5)
WBC Count: 5.4 10*3/uL (ref 4.0–10.5)
nRBC: 0 % (ref 0.0–0.2)

## 2022-04-29 LAB — CMP (CANCER CENTER ONLY)
ALT: 11 U/L (ref 0–44)
AST: 16 U/L (ref 15–41)
Albumin: 3.9 g/dL (ref 3.5–5.0)
Alkaline Phosphatase: 87 U/L (ref 38–126)
Anion gap: 4 — ABNORMAL LOW (ref 5–15)
BUN: 32 mg/dL — ABNORMAL HIGH (ref 8–23)
CO2: 30 mmol/L (ref 22–32)
Calcium: 9.5 mg/dL (ref 8.9–10.3)
Chloride: 105 mmol/L (ref 98–111)
Creatinine: 0.81 mg/dL (ref 0.44–1.00)
GFR, Estimated: >60 mL/min (ref >60)
Glucose, Bld: 106 mg/dL — ABNORMAL HIGH (ref 70–99)
Potassium: 4.3 mmol/L (ref 3.5–5.1)
Sodium: 139 mmol/L (ref 135–145)
Total Bilirubin: 0.3 mg/dL (ref 0.3–1.2)
Total Protein: 6.6 g/dL (ref 6.5–8.1)

## 2022-04-29 MED ORDER — TRAMADOL HCL 50 MG PO TABS: 50.0000 mg | ORAL_TABLET | Freq: Four times a day (QID) | ORAL | 0 refills | Status: AC | PRN

## 2022-04-29 NOTE — Progress Notes (Signed)
Patient's chemotherapy was held today secondary to her not feeling well.  She was being seen today in symptom management clinic with complaints of chest pain.  Nutrition follow-up has been canceled.  I will reschedule appointment with an upcoming treatment.

## 2022-04-29 NOTE — Progress Notes (Signed)
Symptom Management Consult note [place]    Patient Care Team: [name], MD as PCP - General (Internal Medicine) Josue Hector, MD as PCP - Cardiology (Cardiology)    Name of the patient: [name]  [id number]  12-14-1929   Date of visit: 04/29/2022    Chief complaint/ Reason for visit- shortness of breath, fatigue  Oncology History  Adenocarcinoma of right lung, stage 4 ([place])  12/30/2021 Initial Diagnosis   Adenocarcinoma of right lung, stage 4 ([place])   12/30/2021 Cancer Staging   Staging form: Lung, AJCC 8th Edition - Clinical: Stage IVA (cT2b, cM1a) - Signed by Curt Bears, MD on 12/30/2021   01/19/2022 - 02/22/2022 Chemotherapy   Patient is on Treatment Plan : LUNG Carboplatin / Paclitaxel + XRT q7d     04/01/2022 -  Chemotherapy   Patient is on Treatment Plan : LUNG NSCLC Durvalumab q28d       Current Therapy: Imfinzi   Last treatment:    04/01/22  Day 1   Cycle 1  Interval history- [name] is a 88 y.o. with oncologic history as above presenting to [facility] today with chief complaint of shortness of breath x approximately 3 weeks. Patient's daughter [name] on speaker phone to provide additional history. Patient tells me she has had fatigue and shortness of breath since her treatment on 04/01/22. Her fatigue is constant. Her shortness of breath is intermittent. [name] states patient is gasping for air at times. Patient is also reporting pain in her right breast.  She describes it as shooting pain and it is worse when bending over.  Pain has been intermittent.  She denies any injury or history of similar pain that she can remember.  She denies chest pain. She takes Tylenol for her pain and typically will get relief however occasionally the pain will be severe and Tylenol does not help.  Daughter reports that patient has had several falls over the last month, mostly at night when she does not call for assistance when getting up.  Patient  has not had any traumatic injuries from the falls per daughter.  Patient has taken tramadol in the past with symptom improvement so they are wondering if she can get a prescription for that today.  Patient also admits to a non productive cough for several months, does not member exactly when it started.   Because of patient's symptoms they are concerned for possibility of worsening pleural effusion or if her cancer has returned.  Patient denies any weight loss, night sweats, fever, chills, short of breath, palpitations, syncope, hemoptysis, hematemesis, lower extremity edema, rash, abnormal bleeding or bruising.       ROS  All other systems are reviewed and are negative for acute change except as noted in the HPI.    Allergies  Allergen Reactions   Bee Venom Anaphylaxis    Bee sting   Sulfa Antibiotics Anaphylaxis   Sulfamethoxazole Anaphylaxis   Hydrocodone-Acetaminophen Nausea Only   Meperidine Nausea And Vomiting    Demoral   Morphine Nausea And Vomiting   Oxycodone Hcl Nausea Only   Oxycodone-Acetaminophen Nausea Only   Anesthesia S-I-40 [Propofol] Nausea Only   Caffeine Other (See Comments)   Hydrocodone Nausea And Vomiting   Lactose Nausea And Vomiting   Oxycodone-Aspirin Nausea And Vomiting   Propoxyphene    Soy Allergy Other (See Comments)   Codeine Nausea And Vomiting   Latex Rash   Valdecoxib Nausea Only  Past Medical History:  Diagnosis Date   Actinic keratoses 07/24/2018   Adhesive capsulitis of shoulder 12/06/2013   Arthritis    Atrial fibrillation ([place]) 07/17/2018   Benign essential HTN 07/24/2018   Last Assessment & Plan:  She is doing better with adjustments in her blood pressure medication and will continue this monitoring. Last Assessment & Plan:  She is doing better with adjustments in her blood pressure medication and will continue this monitoring.   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   Hx of basal cell carcinoma excision 07/24/2018   Hx of melanoma in situ 07/24/2018   Hx of squamous cell carcinoma of skin 07/24/2018   Hyperlipidemia 07/24/2018   Lentigines 03/06/2019   Myocardial infarction [facility])    Silent Heart Attack   Past myocardial infarction 03/06/2019   Polycythemia    Polycythemia vera ([place]) 07/17/2018   Last Assessment & Plan:  The patient's blood counts remain in the desired range on her current dose of Hydrea.  She will continue this medication without dose modification.  She is moving to [facility] next week and we will help her arrange for necessary follow-up there.  She will return here on an as-needed basis.   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     Past Surgical History:  Procedure Laterality Date   ABDOMINAL HYSTERECTOMY  2005   ACHILLES TENDON SURGERY Right    2008   ANKLE FRACTURE SURGERY Left 2000   Breast Recontstruction Right    2004   BRONCHIAL BIOPSY  12/21/2021   Procedure: BRONCHIAL BIOPSIES;  Surgeon: Collene Gobble, MD;  Location: [facility] ENDOSCOPY;  Service: Pulmonary;;   BRONCHIAL BRUSHINGS  12/21/2021   Procedure: BRONCHIAL BRUSHINGS;  Surgeon: Collene Gobble, MD;  Location: [facility] ENDOSCOPY;  Service: Pulmonary;;   BRONCHIAL NEEDLE ASPIRATION BIOPSY  12/21/2021   Procedure: BRONCHIAL NEEDLE ASPIRATION BIOPSIES;  Surgeon: Collene Gobble, MD;  Location: [facility] ENDOSCOPY;  Service: Pulmonary;;   COLONOSCOPY W/ POLYPECTOMY     EYE SURGERY Bilateral    Cataract   FASCIOTOMY  2001   FEMUR SURGERY  2018   Crushed Femur,    HAMMER TOE SURGERY Bilateral 2014   HERNIA REPAIR  2012   abdominal   LAMINECTOMY  1994   L3-L4, L4-L5  (1987 C2-C3, C3-C4)   MASTECTOMY Bilateral     1997-R, L -2004   MENISCUS REPAIR Right 2007   arthroscopy   SHOULDER OPEN ROTATOR CUFF REPAIR Right 2015   SKIN CANCER EXCISION     basal, squamous, melonoma   TONSILLECTOMY     VIDEO BRONCHOSCOPY WITH RADIAL ENDOBRONCHIAL ULTRASOUND  12/21/2021   Procedure: VIDEO BRONCHOSCOPY WITH RADIAL ENDOBRONCHIAL ULTRASOUND;  Surgeon: Collene Gobble, MD;  Location: [facility] ENDOSCOPY;  Service: Pulmonary;;    Social History   Socioeconomic History   Marital status: Widowed    Spouse name: Not on file   Number of children: Not on file   Years of education: Not on file   Highest education level: Not on file  Occupational History   Not on file  Tobacco Use   Smoking status: Former    Types: Cigarettes  Smokeless tobacco: Never  Vaping Use   Vaping Use: Never used  Substance and Sexual Activity   Alcohol use: Not Currently   Drug use: Never   Sexual activity: Not Currently    Birth control/protection: Surgical    Comment: Hysterectomy  Other Topics Concern   Not on file  Social History Narrative   Not on file   Social Determinants of Health   Financial Resource Strain: Not on file  Food Insecurity: Not on file  Transportation Needs: Not on file  Physical Activity: Not on file  Stress: Not on file  Social Connections: Not on file  Intimate Partner Violence: Not At Risk (01/05/2022)   Humiliation, Afraid, Rape, and Kick questionnaire    Fear of Current or Ex-Partner: No    Emotionally Abused: No    Physically Abused: No    Sexually Abused: No    Family History  Problem Relation Age of Onset   Heart attack Father      Current Outpatient Medications:    traMADol (ULTRAM) 50 MG tablet, Take 1 tablet (50 mg total) by mouth every 6 (six) hours as needed for up to 7 days., Disp: 28 tablet, Rfl: 0   acetaminophen (TYLENOL) 325 MG tablet, Take 650 mg by mouth daily as needed for moderate pain or mild pain (4,000 mg daily as needed)., Disp: , Rfl:    acetaminophen (TYLENOL) 650 MG CR  tablet, Take 1,300 mg by mouth 2 (two) times daily., Disp: , Rfl:    apixaban (ELIQUIS) 5 MG TABS tablet, Take 1 tablet (5 mg total) by mouth 2 (two) times daily. Okay to restart this medication on 12/22/2021, Disp: , Rfl:    Ascorbic Acid (VITAMIN C PO), Take 1,000 mg by mouth daily., Disp: , Rfl:    EPINEPHrine 0.3 mg/0.3 mL IJ SOAJ injection, Inject 0.3 mg into the muscle as needed for anaphylaxis., Disp: , Rfl:    flecainide (TAMBOCOR) 50 MG tablet, Take 1 tablet (50 mg total) by mouth 2 (two) times daily., Disp: 180 tablet, Rfl: 2   furosemide (LASIX) 20 MG tablet, Take 20 mg by mouth daily., Disp: , Rfl:    hydroxyurea (HYDREA) 500 MG capsule, Take 500 mg by mouth See admin instructions. Take 500 mg by mouth twice daily Monday-Friday and once daily on Saturday and Sunday, Disp: , Rfl:    lidocaine 4 %, Place 1 patch onto the skin every 12 (twelve) hours. 12 hours on 12 hours off, Disp: , Rfl:    magnesium hydroxide (MILK OF MAGNESIA) 400 MG/5ML suspension, Take 30 mLs by mouth daily., Disp: , Rfl:    Menthol, Topical Analgesic, (BIOFREEZE ROLL-ON) 4 % GEL, Apply topically., Disp: , Rfl:    polyethylene glycol (MIRALAX / GLYCOLAX) 17 g packet, Take 17 g by mouth daily., Disp: , Rfl:    prochlorperazine (COMPAZINE) 10 MG tablet, Take 1 tablet (10 mg total) by mouth every 6 (six) hours as needed., Disp: 30 tablet, Rfl: 2   sennosides-docusate sodium (SENOKOT-S) 8.6-50 MG tablet, Take 1 tablet by mouth daily., Disp: , Rfl:   PHYSICAL EXAM: ECOG FS:1 - Symptomatic but completely ambulatory    Vitals:   04/29/22 1349  BP: (!) 149/77  Pulse: 84  Resp: 15  Temp: 97.9 F (36.6 C)  TempSrc: Oral  SpO2: 97%  Weight: 131 lb 12.8 oz (59.8 kg)  Height: 5\' 8"  (1.727 m)   Physical Exam Vitals and nursing note reviewed.  Constitutional:      Appearance: She  is well-developed. She is not ill-appearing or toxic-appearing.  HENT:     Head: Normocephalic and atraumatic.     Nose: Nose normal.   Eyes:     General: No scleral icterus.       Right eye: No discharge.        Left eye: No discharge.     Conjunctiva/sclera: Conjunctivae normal.  Neck:     Vascular: No JVD.  Cardiovascular:     Rate and Rhythm: Normal rate. Rhythm irregular.     Pulses: Normal pulses.     Heart sounds: Normal heart sounds.  Pulmonary:     Effort: Pulmonary effort is normal. No respiratory distress.     Breath sounds: No stridor. No wheezing, rhonchi or rales.     Comments: Decreased lung sounds right posterior base.  Dullness to percussion over posterior right lung base. Chest:     Chest wall: No tenderness, crepitus or edema.  Breasts:    Right: Normal.     Left: Normal.  Abdominal:     General: There is no distension.  Musculoskeletal:        General: Normal range of motion.     Cervical back: Normal range of motion.     Comments: Moving all extremities without difficulty.  No obvious deformities.  Skin:    General: Skin is warm and dry.  Neurological:     Mental Status: She is oriented to person, place, and time.     GCS: GCS eye subscore is 4. GCS verbal subscore is 5. GCS motor subscore is 6.     Comments: Fluent speech, no facial droop.  Psychiatric:        Behavior: Behavior normal.        LABORATORY DATA: I have reviewed the data as listed    Latest Ref Rng & Units 04/29/2022    1:26 PM 04/01/2022    1:01 PM 03/23/2022   12:46 PM  CBC  WBC 4.0 - 10.5 K/uL 5.4  4.8  5.3   Hemoglobin 12.0 - 15.0 g/dL 12.2  11.5  10.3   Hematocrit 36.0 - 46.0 % 37.1  34.4  30.5   Platelets 150 - 400 K/uL 302  364  420         Latest Ref Rng & Units 04/29/2022    1:26 PM 04/01/2022    1:01 PM 03/23/2022   12:46 PM  CMP  Glucose 70 - 99 mg/dL 106  111  100   BUN 8 - 23 mg/dL 32  34  27   Creatinine 0.44 - 1.00 mg/dL 0.81  0.70  0.72   Sodium 135 - 145 mmol/L 139  140  139   Potassium 3.5 - 5.1 mmol/L 4.3  4.0  4.4   Chloride 98 - 111 mmol/L 105  107  106   CO2 22 - 32 mmol/L 30  27  27     Calcium 8.9 - 10.3 mg/dL 9.5  9.0  9.3   Total Protein 6.5 - 8.1 g/dL 6.6  6.9  6.8   Total Bilirubin 0.3 - 1.2 mg/dL 0.3  0.4  0.4   Alkaline Phos 38 - 126 U/L 87  68  65   AST 15 - 41 U/L 16  20  14    ALT 0 - 44 U/L 11  15  9         RADIOGRAPHIC STUDIES (from last 24 hours if applicable) I have personally reviewed the radiological images as listed and agreed with  the findings in the report. No results found.     ASSESSMENT & PLAN: Patient is a 88 y.o. female  with oncologic history of stage IV non-small cell, adenocarcinoma of right lung followed by Dr. Julien Nordmann.  I have viewed most recent oncology note and lab work.   #)Stage IV non-small cell, adenocarcinoma of right lung - Consolidation treatment with immunotherapy Imfinzi q4 weeks, first dose was 04/01/22.  -Oncologist recommends holding treatment today as patient is not feeling well. -Next appointment with oncologist is 05/27/22  #)Shortness of breath -Chart review shows patient had CT chest after completing chemotherapy 03/23/2022 that showed a trace right pleural effusion and interval response to therapy with slight decrease in right lobe mass size. -Patient is nontoxic-appearing and in no acute distress on exam.  Lung exam reveals decreased breath sounds in right lung base and dullness to percussion. -CBC and CMP are overall unremarkable.   -Engaged in shared decision making with patient and daughter regarding work-up of her shortness of breath.  They are agreeable with plan for CT chest to rule out pneumonitis and evaluate size of the pleural effusion.  As patient is stable at this time image has been scheduled for tomorrow.  I will follow-up on the results.  #)Pain -Pain in right breast and back ongoing x 1 month. Based on HPI ACS and PE  less likely cause. -She has had several falls over the last month per family, could be MSK injury.  No obvious deformities on exam today -I have reviewed the PDMP during this encounter.  Patient without active prescriptions. Tramadol sent to pharmacy for prn use severe pain   Strict ED precautions discussed should symptoms worsen.   Visit Diagnosis: 1. Adenocarcinoma of right lung, stage 4 ([place])   2. Shortness of breath      Orders Placed This Encounter  Procedures   CT Chest W Contrast    Standing Status:   Future    Standing Expiration Date:   04/29/2023    Order Specific Question:   If indicated for the ordered procedure, I authorize the administration of contrast media per Radiology protocol    Answer:   Yes    Order Specific Question:   Preferred imaging location?    Answer:   [facility]    All questions were answered. The patient knows to call the clinic with any problems, questions or concerns. No barriers to learning was detected.  I have spent a total of 30 minutes minutes of face-to-face and non-face-to-face time, preparing to see the patient, obtaining and/or reviewing separately obtained history, performing a medically appropriate examination, counseling and educating the patient, ordering tests, documenting clinical information in the electronic health record, and care coordination (communications with other health care professionals or caregivers).    Thank you for allowing me to participate in the care of this patient.    Barrie Folk, PA-C Department of Hematology/Oncology [facility] at [facility] Phone: [phone]  [phone]    04/29/2022 4:56 PM

## 2022-04-30 ENCOUNTER — Ambulatory Visit (HOSPITAL_BASED_OUTPATIENT_CLINIC_OR_DEPARTMENT_OTHER): Payer: Medicare Other | Admitting: Physician Assistant

## 2022-04-30 ENCOUNTER — Encounter (HOSPITAL_COMMUNITY): Payer: Self-pay

## 2022-04-30 ENCOUNTER — Ambulatory Visit (HOSPITAL_COMMUNITY)
Admission: RE | Admit: 2022-04-30 | Discharge: 2022-04-30 | Disposition: A | Discharge status: Home / Self Care | Payer: Medicare Other | Source: Ambulatory Visit | Attending: Physician Assistant | Admitting: Physician Assistant

## 2022-04-30 ENCOUNTER — Telehealth: Payer: Self-pay

## 2022-04-30 DIAGNOSIS — C3491 Malignant neoplasm of unspecified part of right bronchus or lung: Secondary | ICD-10-CM | POA: Diagnosis present

## 2022-04-30 DIAGNOSIS — J90 Pleural effusion, not elsewhere classified: Secondary | ICD-10-CM

## 2022-04-30 DIAGNOSIS — S32039A Unspecified fracture of third lumbar vertebra, initial encounter for closed fracture: Secondary | ICD-10-CM

## 2022-04-30 MED ORDER — IOHEXOL 300 MG/ML  SOLN
75.0000 mL | Freq: Once | INTRAMUSCULAR | Status: AC | PRN
  Administered 2022-04-30: 75 mL via INTRAVENOUS

## 2022-04-30 MED ORDER — SODIUM CHLORIDE (PF) 0.9 % IJ SOLN
INTRAMUSCULAR | Status: AC
  Filled 2022-04-30: qty 50

## 2022-04-30 NOTE — Progress Notes (Signed)
I evaluated patient in clinic yesterday for shortness of breath and pain.  During that visit patient's daughter [name] was on the phone provided most of the history as patient was unable to.  The plan yesterday was for patient to have a CT scan and I will follow-up on the results with [name] today via phone. I used two identifiers to verify I was speaking about the correct patient. I offered to call the patient as well with the results.  [name] prefers to call patient to give the results.  _____________________________________  CT chest with contrast CLINICAL DATA:  Non-small cell lung cancer, chemotherapy completed in July 2023. Pneumonitis, and immunotherapy related toxicity.   * Tracking Code: BO *   EXAM: CT CHEST WITH CONTRAST   TECHNIQUE: Multidetector CT imaging of the chest was performed during intravenous contrast administration.   RADIATION DOSE REDUCTION: This exam was performed according to the departmental dose-optimization program which includes automated exposure control, adjustment of the mA and/or kV according to patient size and/or use of iterative reconstruction technique.   CONTRAST:  85mL OMNIPAQUE IOHEXOL 300 MG/ML  SOLN   COMPARISON:  03/23/2022   FINDINGS: Cardiovascular: Coronary, aortic arch, and branch vessel atherosclerotic vascular disease.   Mediastinum/Nodes: No pathologic adenopathy.   Lungs/Pleura: New moderate to large right pleural effusion. Biapical pleuroparenchymal scarring. Stable subpleural nodularity in the right upper lobe. Progressive atelectasis and potentially some mild consolidation in regions previously affected by ground-glass opacity in the right middle lobe, right lower lobe, and to a lesser extent in the right upper lobe. Stable mild scarring in the posterior basal segment left lower lobe without substantial new or progressive airspace opacity in the left lung. 4 mm left upper lobe subpleural nodule on image 29 series 5 is  stable.   The right middle lobe mass measures about 3.5 by 2.7 cm on image 67 series 5, formerly 3.6 by 2.2 cm by my measurements.   Upper Abdomen: Atherosclerosis is present, including aortoiliac atherosclerotic disease.   Musculoskeletal: We partially include a substantial L3 compression fracture with some mild bony retropulsion, this was not present on the prior CT abdomen from 12/06/2021. Hemangioma the T11 vertebral body. Stable nonspecific sclerosis eccentric to the right in the T4 vertebral body. Thoracic spondylosis. Abnormal sclerosis of the right T11 transverse process and adjacent pars region, stress fracture versus treated metastatic lesion.   IMPRESSION: 1. New moderate to large right pleural effusion. Previous ground-glass density regions in the right lung have evolved into volume loss and consolidation. There is no similar involvement of the left lung to indicate a systemic process although local therapy such as radiation therapy may be contributing to the appearance. 2. Stable subpleural nodularity in the right lung. Roughly stable 3.5 cm right middle lobe lung mass. 3. Abnormal sclerosis in the right T11 transverse process and adjacent pars region, stress fracture versus treated metastatic lesion. There is also sclerosis in the right T4 vertebral body which could be a treated metastatic lesion. 4. Aortic Atherosclerosis (ICD10-I70.0). Coronary and systemic atherosclerosis. 5. Substantial compression fracture at L3 is partially included, this has anterior and middle column involvement, but most of the L3 vertebral body is not included on today's CT chest. This could be further characterized with dedicated lumbar spine imaging.     Electronically Signed   By: Van Clines M.D.   On: 04/30/2022 11:55  ______________________________________________  I discussed results with [name] and recommended the plan of IR thoracentesis for the pleural effusion and  MR  lumbar spine for further evaluation of L3 fracture. The fracture could be a result of her recent falls or pathologic.   I had lengthy discussion about having this performed Monday as today is Friday and patient is unable to get transportation to the hospital at this time if we were able to schedule thoracentesis for today as she needs 24 hours of notice for her assisted living facility.  Patient yesterday was neurologically intact and did not have any red flag symptoms. She is stable for outpatient workup based on exam and vitals signs yesterday.  I discussed what concerning symptoms would be and what would warrant ER evaluation should they develop.  Daughter agrees with plan with thoracentesis and MR lumbar on Monday.Dr. Julien Nordmann will be made aware of the plan as well.

## 2022-04-30 NOTE — Telephone Encounter (Signed)
RN called patient's daughter regarding scheduled thoracentesis and MRI. Patient's daughter verbalized understanding of time, date and location.

## 2022-05-03 ENCOUNTER — Ambulatory Visit (HOSPITAL_COMMUNITY)
Admission: RE | Admit: 2022-05-03 | Discharge: 2022-05-03 | Disposition: A | Discharge status: Home / Self Care | Payer: Medicare Other | Source: Ambulatory Visit | Attending: Physician Assistant | Admitting: Physician Assistant

## 2022-05-03 ENCOUNTER — Ambulatory Visit (HOSPITAL_COMMUNITY)
Admission: RE | Admit: 2022-05-03 | Discharge: 2022-05-03 | Disposition: A | Discharge status: Home / Self Care | Payer: Medicare Other | Source: Ambulatory Visit | Attending: Internal Medicine | Admitting: Internal Medicine

## 2022-05-03 VITALS — BP 142/69

## 2022-05-03 DIAGNOSIS — J90 Pleural effusion, not elsewhere classified: Secondary | ICD-10-CM | POA: Insufficient documentation

## 2022-05-03 DIAGNOSIS — M47816 Spondylosis without myelopathy or radiculopathy, lumbar region: Secondary | ICD-10-CM | POA: Diagnosis not present

## 2022-05-03 DIAGNOSIS — M4856XA Collapsed vertebra, not elsewhere classified, lumbar region, initial encounter for fracture: Secondary | ICD-10-CM | POA: Insufficient documentation

## 2022-05-03 DIAGNOSIS — M5136 Other intervertebral disc degeneration, lumbar region: Secondary | ICD-10-CM | POA: Diagnosis not present

## 2022-05-03 DIAGNOSIS — S32039A Unspecified fracture of third lumbar vertebra, initial encounter for closed fracture: Secondary | ICD-10-CM

## 2022-05-03 DIAGNOSIS — M48061 Spinal stenosis, lumbar region without neurogenic claudication: Secondary | ICD-10-CM | POA: Diagnosis not present

## 2022-05-03 HISTORY — PX: IR THORACENTESIS ASP PLEURAL SPACE W/IMG GUIDE: IMG5380

## 2022-05-03 MED ORDER — GADOBUTROL 1 MMOL/ML IV SOLN
6.0000 mL | Freq: Once | INTRAVENOUS | Status: AC | PRN
  Administered 2022-05-03: 6 mL via INTRAVENOUS

## 2022-05-03 MED ORDER — LIDOCAINE HCL 1 % IJ SOLN
INTRAMUSCULAR | Status: AC
  Administered 2022-05-03: 20 mL
  Filled 2022-05-03: qty 20

## 2022-05-03 NOTE — Procedures (Signed)
PROCEDURE SUMMARY:  Successful US guided diagnostic and therapeutic right thoracentesis. Yielded 900 cc of clear, amber fluid. Pt tolerated procedure well. No immediate complications.  Specimen was sent for labs. CXR ordered.  EBL < 1 mL  Tyson Alias, AGNP 05/03/2022 2:48 PM

## 2022-05-04 ENCOUNTER — Other Ambulatory Visit: Payer: Self-pay

## 2022-05-04 ENCOUNTER — Encounter: Payer: Self-pay | Admitting: Nurse Practitioner

## 2022-05-04 ENCOUNTER — Inpatient Hospital Stay (HOSPITAL_BASED_OUTPATIENT_CLINIC_OR_DEPARTMENT_OTHER): Payer: Medicare Other | Admitting: Nurse Practitioner

## 2022-05-04 ENCOUNTER — Inpatient Hospital Stay (HOSPITAL_BASED_OUTPATIENT_CLINIC_OR_DEPARTMENT_OTHER): Payer: Medicare Other | Admitting: Physician Assistant

## 2022-05-04 VITALS — BP 122/47 | HR 72 | Temp 97.6°F | Resp 15 | Wt 131.3 lb

## 2022-05-04 DIAGNOSIS — R110 Nausea: Secondary | ICD-10-CM

## 2022-05-04 DIAGNOSIS — K5900 Constipation, unspecified: Secondary | ICD-10-CM

## 2022-05-04 DIAGNOSIS — Z515 Encounter for palliative care: Secondary | ICD-10-CM

## 2022-05-04 DIAGNOSIS — G893 Neoplasm related pain (acute) (chronic): Secondary | ICD-10-CM | POA: Diagnosis not present

## 2022-05-04 DIAGNOSIS — C3491 Malignant neoplasm of unspecified part of right bronchus or lung: Secondary | ICD-10-CM

## 2022-05-04 DIAGNOSIS — R530 Neoplastic (malignant) related fatigue: Secondary | ICD-10-CM | POA: Diagnosis not present

## 2022-05-04 DIAGNOSIS — R350 Frequency of micturition: Secondary | ICD-10-CM | POA: Diagnosis not present

## 2022-05-04 LAB — URINALYSIS, COMPLETE (UACMP) WITH MICROSCOPIC
Bilirubin Urine: NEGATIVE
Glucose, UA: NEGATIVE mg/dL
Hgb urine dipstick: NEGATIVE
Ketones, ur: NEGATIVE mg/dL
Nitrite: NEGATIVE
Protein, ur: NEGATIVE mg/dL
Specific Gravity, Urine: 1.027 (ref 1.005–1.030)
pH: 5 (ref 5.0–8.0)

## 2022-05-04 MED ORDER — FENTANYL 12 MCG/HR TD PT72: 1.0000 | MEDICATED_PATCH | TRANSDERMAL | 0 refills | Status: AC

## 2022-05-04 MED ORDER — CELECOXIB 200 MG PO CAPS: 200.0000 mg | ORAL_CAPSULE | Freq: Two times a day (BID) | ORAL | 1 refills | Status: DC

## 2022-05-04 NOTE — Patient Instructions (Addendum)
Thank you for allowing [place] to assist in your care today. During our visit we discussed the following:  Please discontinue use of Tylenol around the clock.  Continue taking tramadol 50-100mg  every 6 hours for pain Please start Fentanyl 65mcg patch every 3 days. Place tegaderm over area when showering.  Recommend discontinuing regular use of Milk of Magnesia. Daily Miralax and senna twice daily would be requested regimen. May also increase Miralax to twice daily to gain better results.  We will continue to closely monitor your pain and symptoms making adjustments as needed.

## 2022-05-04 NOTE — Progress Notes (Signed)
[place]  [phone] [phone]   Name: [name] Date: 05/04/2022 MRN: [id number]  DOB: 09/11/1929  Patient Care Team: [name], MD as PCP - General (Internal Medicine) Josue Hector, MD as PCP - Cardiology (Cardiology)    REASON FOR CONSULTATION: [name] is a 88 y.o. female with medical history including stage IV right lung non-small cell lung cancer (12/2021) s/p 6 cycles of chemoradiation.  Palliative ask to see for symptom management and goals of care.    SOCIAL HISTORY:     reports that she has quit smoking. Her smoking use included cigarettes. She has never used smokeless tobacco. She reports that she does not currently use alcohol. She reports that she does not use drugs.  ADVANCE DIRECTIVES:    CODE STATUS:  PAST MEDICAL HISTORY: Past Medical History:  Diagnosis Date   Actinic keratoses 07/24/2018   Adhesive capsulitis of shoulder 12/06/2013   Arthritis    Atrial fibrillation ([place]) 07/17/2018   Benign essential HTN 07/24/2018   Last Assessment & Plan:  She is doing better with adjustments in her blood pressure medication and will continue this monitoring. Last Assessment & Plan:  She is doing better with adjustments in her blood pressure medication and will continue this monitoring.   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   Hx of basal cell carcinoma excision 07/24/2018   Hx of melanoma in situ 07/24/2018   Hx of squamous cell carcinoma of skin 07/24/2018   Hyperlipidemia 07/24/2018   Lentigines 03/06/2019   Myocardial infarction [facility])    Silent Heart Attack   Past myocardial infarction 03/06/2019    Polycythemia    Polycythemia vera ([place]) 07/17/2018   Last Assessment & Plan:  The patient's blood counts remain in the desired range on her current dose of Hydrea.  She will continue this medication without dose modification.  She is moving to [facility] next week and we will help her arrange for necessary follow-up there.  She will return here on an as-needed basis.   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    PAST SURGICAL HISTORY:  Past Surgical History:  Procedure Laterality Date   ABDOMINAL HYSTERECTOMY  2005   ACHILLES TENDON SURGERY Right    2008   ANKLE FRACTURE SURGERY Left 2000   Breast Recontstruction Right    2004   BRONCHIAL BIOPSY  12/21/2021   Procedure: BRONCHIAL BIOPSIES;  Surgeon: Collene Gobble, MD;  Location: [facility] ENDOSCOPY;  Service: Pulmonary;;   BRONCHIAL BRUSHINGS  12/21/2021   Procedure: BRONCHIAL BRUSHINGS;  Surgeon: Collene Gobble, MD;  Location: [facility] ENDOSCOPY;  Service: Pulmonary;;   BRONCHIAL NEEDLE ASPIRATION BIOPSY  12/21/2021   Procedure: BRONCHIAL NEEDLE ASPIRATION BIOPSIES;  Surgeon: Collene Gobble, MD;  Location: [facility] ENDOSCOPY;  Service: Pulmonary;;   COLONOSCOPY W/ POLYPECTOMY     EYE SURGERY Bilateral    Cataract   FASCIOTOMY  2001   FEMUR SURGERY  2018   Crushed Femur,    HAMMER TOE SURGERY Bilateral 2014   HERNIA REPAIR  2012   abdominal   IR THORACENTESIS ASP PLEURAL SPACE  W/IMG GUIDE  05/03/2022   LAMINECTOMY  1994   L3-L4, L4-L5  (1987 C2-C3, C3-C4)   MASTECTOMY Bilateral    1997-R, L -2004   MENISCUS REPAIR Right 2007   arthroscopy   SHOULDER OPEN ROTATOR CUFF REPAIR Right 2015   SKIN CANCER EXCISION     basal, squamous, melonoma   TONSILLECTOMY     VIDEO BRONCHOSCOPY WITH RADIAL ENDOBRONCHIAL ULTRASOUND  12/21/2021   Procedure: VIDEO BRONCHOSCOPY WITH RADIAL ENDOBRONCHIAL ULTRASOUND;  Surgeon: Collene Gobble, MD;  Location: [facility] ENDOSCOPY;  Service: Pulmonary;;     HEMATOLOGY/ONCOLOGY HISTORY:  Oncology History  Adenocarcinoma of right lung, stage 4 ([place])  12/30/2021 Initial Diagnosis   Adenocarcinoma of right lung, stage 4 ([place])   12/30/2021 Cancer Staging   Staging form: Lung, AJCC 8th Edition - Clinical: Stage IVA (cT2b, cM1a) - Signed by Curt Bears, MD on 12/30/2021   01/19/2022 - 02/22/2022 Chemotherapy   Patient is on Treatment Plan : LUNG Carboplatin / Paclitaxel + XRT q7d     04/01/2022 -  Chemotherapy   Patient is on Treatment Plan : LUNG NSCLC Durvalumab q28d       ALLERGIES:  is allergic to bee venom, sulfa antibiotics, sulfamethoxazole, hydrocodone-acetaminophen, meperidine, morphine, oxycodone hcl, oxycodone-acetaminophen, anesthesia [id number] [propofol], caffeine, hydrocodone, lactose, oxycodone-aspirin, propoxyphene, soy allergy, codeine, latex, and valdecoxib.  MEDICATIONS:  Current Outpatient Medications  Medication Sig Dispense Refill   acetaminophen (TYLENOL) 325 MG tablet Take 650 mg by mouth daily as needed for moderate pain or mild pain (4,000 mg daily as needed).     acetaminophen (TYLENOL) 650 MG CR tablet Take 1,300 mg by mouth 2 (two) times daily.     apixaban (ELIQUIS) 5 MG TABS tablet Take 1 tablet (5 mg total) by mouth 2 (two) times daily. Okay to restart this medication on 12/22/2021     EPINEPHrine 0.3 mg/0.3 mL IJ SOAJ injection Inject 0.3 mg into the muscle as needed for anaphylaxis.     flecainide (TAMBOCOR) 50 MG tablet Take 1 tablet (50 mg total) by mouth 2 (two) times daily. 180 tablet 2   furosemide (LASIX) 20 MG tablet Take 20 mg by mouth daily.     lidocaine 4 % Place 1 patch onto the skin every 12 (twelve) hours. 12 hours on 12 hours off (Patient not taking: Reported on 05/04/2022)     magnesium hydroxide (MILK OF MAGNESIA) 400 MG/5ML suspension Take 30 mLs by mouth daily.     Menthol, Topical Analgesic, (BIOFREEZE ROLL-ON) 4 % GEL Apply topically.     polyethylene glycol (MIRALAX / GLYCOLAX) 17 g packet  Take 17 g by mouth daily.     prochlorperazine (COMPAZINE) 10 MG tablet Take 1 tablet (10 mg total) by mouth every 6 (six) hours as needed. 30 tablet 2   sennosides-docusate sodium (SENOKOT-S) 8.6-50 MG tablet Take 1 tablet by mouth daily.     traMADol (ULTRAM) 50 MG tablet Take 1 tablet (50 mg total) by mouth every 6 (six) hours as needed for up to 7 days. 28 tablet 0   No current facility-administered medications for this visit.    VITAL SIGNS: There were no vitals taken for this visit. There were no vitals filed for this visit.  Estimated body mass index is 19.96 kg/m as calculated from the following:   Height as of 04/29/22: 5\' 8"  (1.727 m).   Weight as of an earlier encounter on 05/04/22: 131 lb 4.8 oz (59.6 kg).  LABS: CBC:    Component Value Date/Time  WBC 5.4 04/29/2022 1326   WBC 8.3 11/23/2021 0913   HGB 12.2 04/29/2022 1326   HCT 37.1 04/29/2022 1326   PLT 302 04/29/2022 1326   MCV 104.5 (H) 04/29/2022 1326   NEUTROABS 3.9 04/29/2022 1326   LYMPHSABS 0.8 04/29/2022 1326   MONOABS 0.6 04/29/2022 1326   EOSABS 0.1 04/29/2022 1326   BASOSABS 0.0 04/29/2022 1326   Comprehensive Metabolic Panel:    Component Value Date/Time   NA 139 04/29/2022 1326   K 4.3 04/29/2022 1326   CL 105 04/29/2022 1326   CO2 30 04/29/2022 1326   BUN 32 (H) 04/29/2022 1326   CREATININE 0.81 04/29/2022 1326   GLUCOSE 106 (H) 04/29/2022 1326   CALCIUM 9.5 04/29/2022 1326   AST 16 04/29/2022 1326   ALT 11 04/29/2022 1326   ALKPHOS 87 04/29/2022 1326   BILITOT 0.3 04/29/2022 1326   PROT 6.6 04/29/2022 1326   ALBUMIN 3.9 04/29/2022 1326    RADIOGRAPHIC STUDIES: MR Lumbar Spine W Wo Contrast  Result Date: 05/03/2022 CLINICAL DATA:  Compression fracture, lumbar, known malignancy EXAM: MRI LUMBAR SPINE WITHOUT AND WITH CONTRAST TECHNIQUE: Multiplanar and multiecho pulse sequences of the lumbar spine were obtained without and with intravenous contrast. CONTRAST:  64mL GADAVIST GADOBUTROL 1  MMOL/ML IV SOLN COMPARISON:  CT 04/30/2022, CT 12/06/2021, PET-CT 11/16/2021. FINDINGS: Segmentation:  Standard. Alignment:  Physiologic. Vertebrae: There are multiple T2 hyperintense/T1 hypointense enhancing lesions in the lumbar spine and at T12 consistent with metastatic disease. There is similar edema enhancement along the inferior aspect of S1 adjacent to a tiny rudimentary disc. Mild marrow signal abnormality in the adjacent superior aspect of S2. There is a superior endplate compression fracture of L3 with up to 50% height loss. There is associated marrow edema and enhancement and low T1 signal extending into the posterior elements, though the inferior aspect of L3 demonstrates preserved T1 marrow signal. Conus medullaris and cauda equina: Conus extends to the L1-L2 level. Conus and cauda equina appear normal. Paraspinal and other soft tissues: There is a 1.6 cm peripherally enhancing mass in the right adrenal gland, new from prior exam. Disc levels: T11-T12: No significant spinal canal or neural foraminal narrowing. T12-L1: Degenerative retrolisthesis with mild disc bulging, ligament flavum hypertrophy and mild facet arthropathy. No significant spinal canal or neural foraminal stenosis. L1-L2: No significant stenosis.  Minimal disc bulging. L2-L3: There is broad-based disc bulging, ligamentum flavum hypertrophy and mild facet arthropathy along with mild L3 superior endplate retropulsion resulting in severe spinal canal stenosis, moderate left and moderate right neural foraminal stenosis. There is interspinous bursitis bilaterally with associated enhancement. L3-L4: Disc bulging, ligament flavum hypertrophy and bilateral facet arthropathy results in moderate spinal canal and bilateral subarticular stenosis, right worse than left with potential for impingement of the descending right L4 nerve root. There is moderate bilateral neural foraminal stenosis. L4-L5: Prior posterior decompression. Grade 1  anterolisthesis. Patent spinal canal and neural foramina. L5-S1: Bilateral facet arthropathy.  No significant stenosis. IMPRESSION: Multiple enhancing lesions throughout the lumbar spine, at T12, and suspected at S1, consistent with metastatic disease. Additionally, there is a new 1.6 cm right adrenal lesion concerning for a metastasis. Superior endplate compression fracture of L3 with up to 50% height loss, and associated marrow signal abnormality extending into the posterior elements, suspicious for pathologic fracture, though the inferior aspect of L3 demonstrates preserved T1 marrow signal. Severe spinal canal stenosis and moderate bilateral neural foraminal stenosis at L2-L3 primarily due to disc bulging, ligamentum hypertrophy and facet  arthropathy, with mild contribution from superior endplate retropulsion of L3. Interspinous bursitis is also noted at this level. Moderate spinal canal and subarticular stenosis bilaterally at L3-L4, with potential for impingement of the descending right L4 nerve root. Moderate bilateral neural foraminal stenosis at this level. Prior posterior decompression with grade 1 anterolisthesis but patent spinal canal neural foramina at L4-L5. Electronically Signed   By: Maurine Simmering M.D.   On: 05/03/2022 18:38   IR THORACENTESIS ASP PLEURAL SPACE W/IMG GUIDE  Result Date: 05/03/2022 INDICATION: Patient with history of adenocarcinoma of right lung stage IV with right pleural effusion. Request for diagnostic and therapeutic right thoracentesis. EXAM: ULTRASOUND GUIDED DIAGNOSTIC AND THERAPEUTIC RIGHT THORACENTESIS MEDICATIONS: 10 mL 1 % lidocaine COMPLICATIONS: None immediate. PROCEDURE: An ultrasound guided thoracentesis was thoroughly discussed with the patient and questions answered. The benefits, risks, alternatives and complications were also discussed. The patient understands and wishes to proceed with the procedure. Written consent was obtained. Ultrasound was performed to  localize and mark an adequate pocket of fluid in the RIGHT chest. The area was then prepped and draped in the normal sterile fashion. 1% Lidocaine was used for local anesthesia. Under ultrasound guidance a 6 Fr Safe-T-Centesis catheter was introduced. Thoracentesis was performed. The catheter was removed and a dressing applied. FINDINGS: A total of approximately 900 mL of clear, amber fluid was removed. Samples were sent to the laboratory as requested by the clinical team. IMPRESSION: Successful diagnostic and therapeutic RIGHT thoracentesis yielding 900 mL of pleural fluid. Read by: Narda Rutherford, AGNP-BC Electronically Signed   By: Michaelle Birks M.D.   On: 05/03/2022 15:36   PERFORMANCE STATUS (ECOG) : 2 - Symptomatic, <50% confined to bed  Review of Systems  Musculoskeletal:  Positive for arthralgias and back pain.  Neurological:  Positive for weakness.  Unless otherwise noted, a complete review of systems is negative.  Physical Exam General: NAD, in wheelchair  Cardiovascular: regular rate and rhythm Pulmonary: normal breathing pattern  Extremities: no edema, no joint deformities Skin: no rashes Neurological:AAO x3, weak   IMPRESSION: This is my initial visit with [name]. Her daughter, Bobbye Riggs is present and Geologist, engineering via phone. No acute distress. Does appear weak with some discomfort. States she is tired and not sleeping well due to pain. Able to engage in discussions appropriately.   I introduced myself, [place] RN, and Palliative's role in collaboration with the oncology team. Concept of Palliative Care was introduced as specialized medical care for people and their families living with serious illness.  It focuses on providing relief from the symptoms and stress of a serious illness.  The goal is to improve quality of life for both the patient and the family. Values and goals of care important to patient and family were attempted to be elicited.   [name] is a resident at [facility]. Both of her daughters live out of state and are in the medical field.   At the facility patient spends most of her time in wheelchair. Does not ambulate independently. Requires assistance with ADLs. Appetite is good. Complains of ongoing chest wall and back pain that is not controlled.   Neoplasm related pain Patient and her daughter shares concerns with patient's ongoing pain.  She does have increased sensitivity to medications with a large list of intolerances/allergies.  We reviewed at length her current regimen which consists of tramadol 50 mg every 6 hours as needed for pain and Tylenol 650 mg every 8 hours as needed.  At the facility and family reports she is receiving both medications around-the-clock however with no improvement. Ms. Stankus reports she is not sleeping well due to her pain and this is affecting her quality of life.  We discussed at length pharmacological options to assist in better pain control with consideration of limitations.  Given patient is at high risk of fall and on Eliquis unable to consider Celebrex.  She has significant nausea with oxycodone/hydrocodone.  Given advanced age and concerns of CNS involvement family would prefer to stay away from gabapentin or Cymbalta.  We discussed at length the use of fentanyl patch.  This will provide long-acting pain management allowing for continuation of tramadol as needed for breakthrough pain.  Patient and family verbalized understanding and agreement.  Written prescription provided.   I will plan to continue to closely monitor and make adjustments as needed.  Constipation Patient has been taking senna, MiraLAX, and milk of magnesia daily.  Daughter shares she is having multiple bowel movements.  Concerns for constipation does not follow regimen.  Advised with discontinuing daily use of milk of magnesia however continue with senna and MiraLAX daily.  If no bowel movement daily may increase both to twice daily with use of  milk of magnesia in the setting of no BM over the 2 days.  I discussed the importance of continued conversation with family and their medical providers regarding overall plan of care and treatment options, ensuring decisions are within the context of the patients values and GOCs.  PLAN: Established therapeutic relationship. Education provided on palliative's role in collaboration with their Oncology/Radiation team. Tramadol 50-100 mg every 6 hours as needed for breakthrough pain Fentanyl 12 mcg patch every 72 hours.  Patient previously taking tramadol 50 mg every 6 hours totaling 200 mg/day with a conversion to 40 mg morphine.  With a 25% reduction of calculated 15 mcg we will start patient on 12 mcg patch and titrate as needed. Senna and MiraLAX daily for bowel regimen We will continue to closely monitor and adjust medications as needed. I personally spoke with Adele Barthel, PA at [facility] and discussed recommended changes in medication regimen.  She verbalized understanding and will reach out if any future concerns.  She is hopeful patient will gain better pain control. I will plan to see patient back in 2-4 weeks in collaboration to other oncology appointments. To follow-up next week for close symptom management follow-up in collaboration with her radiation appointment.   Patient expressed understanding and was in agreement with this plan. She also understands that She can call the clinic at any time with any questions, concerns, or complaints.   Thank you for your referral and allowing Palliative to assist in [name]'s care.   Number and complexity of problems addressed: HIGH - 1 or more chronic illnesses with SEVERE exacerbation, progression, or side effects of treatment - advanced cancer, pain. Any controlled substances utilized were prescribed in the context of palliative care.  Time Total: 65 min   Visit consisted of counseling and education dealing with the complex  and emotionally intense issues of symptom management and palliative care in the setting of serious and potentially life-threatening illness.Greater than 50%  of this time was spent counseling and coordinating care related to the above assessment and plan.  Signed by: Alda Lea, AGPCNP-BC Palliative Medicine Team/[place]

## 2022-05-04 NOTE — Progress Notes (Signed)
Symptom Management Consult note [place]    Patient Care Team: [name], MD as PCP - General (Internal Medicine) Josue Hector, MD as PCP - Cardiology (Cardiology)    Name of the patient: [name]  [id number]  1929-09-29   Date of visit: 05/04/2022   Chief Complaint/Reason for visit: f/u MR results, urinary frequency   Current Therapy: Durvalumab  Last treatment:  Day 1   Cycle 1 on 04/01/22   ASSESSMENT & PLAN: Patient is a 87 y.o. female  with oncologic history of stage IV adenocarcinoma of right lung followed by Dr. Julien Nordmann.  I have viewed most recent oncology note and lab work.    #) Stage IV adenocarcinoma of right lung -Shared visit with oncologist today. Discussed MR results which are concerning for mets to the spine. Oncologist will send radiation referral for palliative SBRT.  -Patient and family will take time to think about continuing immunotherapy and follow up with oncologist. -Patient not having enough pain relief on tramadol I prescribed at previous appointment. Palliative care team is able to see patient today to discuss pain management. Appreciate their assistance.    #) Urinary frequency -intermittent x 3 days. History of UTI, none recently. Has been having loose stool recently, unsure if that could have caused UTI. -No CVA tenderness, fever or flank pain. UA collected and I will follow up on results.   #)Hypotension -Patient was hypotensive on clinic arrival 99/47.  She was given a snack and something to eat and blood pressure improved. -Lasix is listed on her medication list.  Daughters are unsure if she is actually taking this for blood pressure control at this time.  I advised patient to hold if systolic was less than 95 and she should follow-up with a cardiologist for further evaluation if blood pressures remain soft.    Heme/Onc History: Oncology History  Adenocarcinoma of right lung, stage 4 ([place])  12/30/2021  Initial Diagnosis   Adenocarcinoma of right lung, stage 4 ([place])   12/30/2021 Cancer Staging   Staging form: Lung, AJCC 8th Edition - Clinical: Stage IVA (cT2b, cM1a) - Signed by Curt Bears, MD on 12/30/2021   01/19/2022 - 02/22/2022 Chemotherapy   Patient is on Treatment Plan : LUNG Carboplatin / Paclitaxel + XRT q7d     04/01/2022 -  Chemotherapy   Patient is on Treatment Plan : LUNG NSCLC Durvalumab q28d         Interval history-: [name] is a 88 y.o. female with oncologic history as above presenting to [facility] today with chief complaint of following up on MRI results and urinary frequency.  Patient is accompanied by her daughter Bobbye Riggs who provides additional history.  Patient had thoracentesis and MR lumbar spine yesterday evening.  She states her shortness of breath has improved since the thoracentesis.  Cytology was ordered.  She is still having pain in her back that she describes as aching.  Pain does not radiate and is located in the middle of her back.  She rates the pain currently 4 out of 10 in severity.  She tried taking the tramadol without much symptom improvement.  She denies any lower extremity weakness, inability to control bladder or bowels, numbness or tingling.  Patient is also concerned that she might have a urinary tract infection.  She has had urinary frequency x3 days.  She has been having loose stool and thinks that might have contributed to possible infection.  She denies any dysuria  or gross hematuria.  She has had urinary tract infections in the past although it was several years ago. No OTC medication taken prior to arrival.      ROS  All other systems are reviewed and are negative for acute change except as noted in the HPI.    Allergies  Allergen Reactions   Bee Venom Anaphylaxis    Bee sting   Sulfa Antibiotics Anaphylaxis   Sulfamethoxazole Anaphylaxis   Hydrocodone-Acetaminophen Nausea Only   Meperidine Nausea And Vomiting    Demoral    Morphine Nausea And Vomiting   Oxycodone Hcl Nausea Only   Oxycodone-Acetaminophen Nausea Only   Anesthesia S-I-40 [Propofol] Nausea Only   Caffeine Other (See Comments)   Hydrocodone Nausea And Vomiting   Lactose Nausea And Vomiting   Oxycodone-Aspirin Nausea And Vomiting   Propoxyphene    Soy Allergy Other (See Comments)   Codeine Nausea And Vomiting   Latex Rash   Valdecoxib Nausea Only     Past Medical History:  Diagnosis Date   Actinic keratoses 07/24/2018   Adhesive capsulitis of shoulder 12/06/2013   Arthritis    Atrial fibrillation ([place]) 07/17/2018   Benign essential HTN 07/24/2018   Last Assessment & Plan:  She is doing better with adjustments in her blood pressure medication and will continue this monitoring. Last Assessment & Plan:  She is doing better with adjustments in her blood pressure medication and will continue this monitoring.   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   Hx of basal cell carcinoma excision 07/24/2018   Hx of melanoma in situ 07/24/2018   Hx of squamous cell carcinoma of skin 07/24/2018   Hyperlipidemia 07/24/2018   Lentigines 03/06/2019   Myocardial infarction [facility])    Silent Heart Attack   Past myocardial infarction 03/06/2019   Polycythemia    Polycythemia vera ([place]) 07/17/2018   Last Assessment & Plan:  The patient's blood counts remain in the desired range on her current dose of Hydrea.  She will continue this medication without dose modification.  She is moving to [facility] next week and we will help her arrange for necessary follow-up there.  She will return here on an as-needed basis.   Ptosis of eyelid 12/05/2009   Seborrheic keratosis 03/06/2019   Vasodepressor  syncope 07/11/2012     Past Surgical History:  Procedure Laterality Date   ABDOMINAL HYSTERECTOMY  2005   ACHILLES TENDON SURGERY Right    2008   ANKLE FRACTURE SURGERY Left 2000   Breast Recontstruction Right    2004   BRONCHIAL BIOPSY  12/21/2021   Procedure: BRONCHIAL BIOPSIES;  Surgeon: Collene Gobble, MD;  Location: [facility] ENDOSCOPY;  Service: Pulmonary;;   BRONCHIAL BRUSHINGS  12/21/2021   Procedure: BRONCHIAL BRUSHINGS;  Surgeon: Collene Gobble, MD;  Location: [facility] ENDOSCOPY;  Service: Pulmonary;;   BRONCHIAL NEEDLE ASPIRATION BIOPSY  12/21/2021   Procedure: BRONCHIAL NEEDLE ASPIRATION BIOPSIES;  Surgeon: Collene Gobble, MD;  Location: [facility] ENDOSCOPY;  Service: Pulmonary;;   COLONOSCOPY W/ POLYPECTOMY     EYE SURGERY Bilateral    Cataract   FASCIOTOMY  2001   FEMUR SURGERY  2018   Crushed Femur,    HAMMER TOE  SURGERY Bilateral 2014   HERNIA REPAIR  2012   abdominal   IR THORACENTESIS ASP PLEURAL SPACE W/IMG GUIDE  05/03/2022   LAMINECTOMY  1994   L3-L4, L4-L5  (1987 C2-C3, C3-C4)   MASTECTOMY Bilateral    1997-R, L -2004   MENISCUS REPAIR Right 2007   arthroscopy   SHOULDER OPEN ROTATOR CUFF REPAIR Right 2015   SKIN CANCER EXCISION     basal, squamous, melonoma   TONSILLECTOMY     VIDEO BRONCHOSCOPY WITH RADIAL ENDOBRONCHIAL ULTRASOUND  12/21/2021   Procedure: VIDEO BRONCHOSCOPY WITH RADIAL ENDOBRONCHIAL ULTRASOUND;  Surgeon: Collene Gobble, MD;  Location: [facility] ENDOSCOPY;  Service: Pulmonary;;    Social History   Socioeconomic History   Marital status: Widowed    Spouse name: Not on file   Number of children: Not on file   Years of education: Not on file   Highest education level: Not on file  Occupational History   Not on file  Tobacco Use   Smoking status: Former    Types: Cigarettes   Smokeless tobacco: Never  Vaping Use   Vaping Use: Never used  Substance and Sexual Activity   Alcohol use: Not Currently   Drug use: Never   Sexual activity: Not Currently     Birth control/protection: Surgical    Comment: Hysterectomy  Other Topics Concern   Not on file  Social History Narrative   Not on file   Social Determinants of Health   Financial Resource Strain: Not on file  Food Insecurity: Not on file  Transportation Needs: Not on file  Physical Activity: Not on file  Stress: Not on file  Social Connections: Not on file  Intimate Partner Violence: Not At Risk (01/05/2022)   Humiliation, Afraid, Rape, and Kick questionnaire    Fear of Current or Ex-Partner: No    Emotionally Abused: No    Physically Abused: No    Sexually Abused: No    Family History  Problem Relation Age of Onset   Heart attack Father      Current Outpatient Medications:    acetaminophen (TYLENOL) 325 MG tablet, Take 650 mg by mouth daily as needed for moderate pain or mild pain (4,000 mg daily as needed)., Disp: , Rfl:    acetaminophen (TYLENOL) 650 MG CR tablet, Take 1,300 mg by mouth 2 (two) times daily., Disp: , Rfl:    apixaban (ELIQUIS) 5 MG TABS tablet, Take 1 tablet (5 mg total) by mouth 2 (two) times daily. Okay to restart this medication on 12/22/2021, Disp: , Rfl:    EPINEPHrine 0.3 mg/0.3 mL IJ SOAJ injection, Inject 0.3 mg into the muscle as needed for anaphylaxis., Disp: , Rfl:    flecainide (TAMBOCOR) 50 MG tablet, Take 1 tablet (50 mg total) by mouth 2 (two) times daily., Disp: 180 tablet, Rfl: 2   furosemide (LASIX) 20 MG tablet, Take 20 mg by mouth daily., Disp: , Rfl:    magnesium hydroxide (MILK OF MAGNESIA) 400 MG/5ML suspension, Take 30 mLs by mouth daily., Disp: , Rfl:    Menthol, Topical Analgesic, (BIOFREEZE ROLL-ON) 4 % GEL, Apply topically., Disp: , Rfl:    polyethylene glycol (MIRALAX / GLYCOLAX) 17 g packet, Take 17 g by mouth daily., Disp: , Rfl:    prochlorperazine (COMPAZINE) 10 MG tablet, Take 1 tablet (10 mg total) by mouth every 6 (six) hours as needed., Disp: 30 tablet, Rfl: 2   sennosides-docusate sodium (SENOKOT-S) 8.6-50 MG tablet, Take  1 tablet by mouth daily.,  Disp: , Rfl:    traMADol (ULTRAM) 50 MG tablet, Take 1 tablet (50 mg total) by mouth every 6 (six) hours as needed for up to 7 days., Disp: 28 tablet, Rfl: 0   fentaNYL (DURAGESIC) 12 MCG/HR, Place 1 patch onto the skin every 3 (three) days., Disp: 10 patch, Rfl: 0   lidocaine 4 %, Place 1 patch onto the skin every 12 (twelve) hours. 12 hours on 12 hours off (Patient not taking: Reported on 05/04/2022), Disp: , Rfl:   PHYSICAL EXAM: ECOG FS:2 - Symptomatic, <50% confined to bed    Vitals:   05/04/22 1104 05/04/22 1317  BP: (!) 99/47 (!) 122/47  Pulse: 81 72  Resp: 15   Temp: 97.6 F (36.4 C)   SpO2: 96%   Weight: 131 lb 4.8 oz (59.6 kg)    Physical Exam Vitals and nursing note reviewed.  Constitutional:      Appearance: She is well-developed. She is not ill-appearing or toxic-appearing.  HENT:     Head: Normocephalic.     Nose: Nose normal.  Eyes:     Conjunctiva/sclera: Conjunctivae normal.  Neck:     Vascular: No JVD.  Cardiovascular:     Rate and Rhythm: Normal rate and regular rhythm.     Pulses: Normal pulses.     Heart sounds: Normal heart sounds.  Pulmonary:     Effort: Pulmonary effort is normal.     Breath sounds: Normal breath sounds.  Abdominal:     General: There is no distension.     Tenderness: There is no right CVA tenderness or left CVA tenderness.  Musculoskeletal:     Cervical back: Normal range of motion.  Skin:    General: Skin is warm and dry.  Neurological:     Mental Status: She is oriented to person, place, and time.     Comments: Sensation grossly intact to light touch in the lower extremities bilaterally. No saddle anesthesias.         LABORATORY DATA: I have reviewed the data as listed    Latest Ref Rng & Units 04/29/2022    1:26 PM 04/01/2022    1:01 PM 03/23/2022   12:46 PM  CBC  WBC 4.0 - 10.5 K/uL 5.4  4.8  5.3   Hemoglobin 12.0 - 15.0 g/dL 12.2  11.5  10.3   Hematocrit 36.0 - 46.0 % 37.1  34.4  30.5    Platelets 150 - 400 K/uL 302  364  420         Latest Ref Rng & Units 04/29/2022    1:26 PM 04/01/2022    1:01 PM 03/23/2022   12:46 PM  CMP  Glucose 70 - 99 mg/dL 106  111  100   BUN 8 - 23 mg/dL 32  34  27   Creatinine 0.44 - 1.00 mg/dL 0.81  0.70  0.72   Sodium 135 - 145 mmol/L 139  140  139   Potassium 3.5 - 5.1 mmol/L 4.3  4.0  4.4   Chloride 98 - 111 mmol/L 105  107  106   CO2 22 - 32 mmol/L 30  27  27    Calcium 8.9 - 10.3 mg/dL 9.5  9.0  9.3   Total Protein 6.5 - 8.1 g/dL 6.6  6.9  6.8   Total Bilirubin 0.3 - 1.2 mg/dL 0.3  0.4  0.4   Alkaline Phos 38 - 126 U/L 87  68  65   AST 15 - 41 U/L  16  20  14    ALT 0 - 44 U/L 11  15  9         RADIOGRAPHIC STUDIES (from last 24 hours if applicable) I have personally reviewed the radiological images as listed and agreed with the findings in the report. MR Lumbar Spine W Wo Contrast  Result Date: 05/03/2022 CLINICAL DATA:  Compression fracture, lumbar, known malignancy EXAM: MRI LUMBAR SPINE WITHOUT AND WITH CONTRAST TECHNIQUE: Multiplanar and multiecho pulse sequences of the lumbar spine were obtained without and with intravenous contrast. CONTRAST:  37mL GADAVIST GADOBUTROL 1 MMOL/ML IV SOLN COMPARISON:  CT 04/30/2022, CT 12/06/2021, PET-CT 11/16/2021. FINDINGS: Segmentation:  Standard. Alignment:  Physiologic. Vertebrae: There are multiple T2 hyperintense/T1 hypointense enhancing lesions in the lumbar spine and at T12 consistent with metastatic disease. There is similar edema enhancement along the inferior aspect of S1 adjacent to a tiny rudimentary disc. Mild marrow signal abnormality in the adjacent superior aspect of S2. There is a superior endplate compression fracture of L3 with up to 50% height loss. There is associated marrow edema and enhancement and low T1 signal extending into the posterior elements, though the inferior aspect of L3 demonstrates preserved T1 marrow signal. Conus medullaris and cauda equina: Conus extends to the  L1-L2 level. Conus and cauda equina appear normal. Paraspinal and other soft tissues: There is a 1.6 cm peripherally enhancing mass in the right adrenal gland, new from prior exam. Disc levels: T11-T12: No significant spinal canal or neural foraminal narrowing. T12-L1: Degenerative retrolisthesis with mild disc bulging, ligament flavum hypertrophy and mild facet arthropathy. No significant spinal canal or neural foraminal stenosis. L1-L2: No significant stenosis.  Minimal disc bulging. L2-L3: There is broad-based disc bulging, ligamentum flavum hypertrophy and mild facet arthropathy along with mild L3 superior endplate retropulsion resulting in severe spinal canal stenosis, moderate left and moderate right neural foraminal stenosis. There is interspinous bursitis bilaterally with associated enhancement. L3-L4: Disc bulging, ligament flavum hypertrophy and bilateral facet arthropathy results in moderate spinal canal and bilateral subarticular stenosis, right worse than left with potential for impingement of the descending right L4 nerve root. There is moderate bilateral neural foraminal stenosis. L4-L5: Prior posterior decompression. Grade 1 anterolisthesis. Patent spinal canal and neural foramina. L5-S1: Bilateral facet arthropathy.  No significant stenosis. IMPRESSION: Multiple enhancing lesions throughout the lumbar spine, at T12, and suspected at S1, consistent with metastatic disease. Additionally, there is a new 1.6 cm right adrenal lesion concerning for a metastasis. Superior endplate compression fracture of L3 with up to 50% height loss, and associated marrow signal abnormality extending into the posterior elements, suspicious for pathologic fracture, though the inferior aspect of L3 demonstrates preserved T1 marrow signal. Severe spinal canal stenosis and moderate bilateral neural foraminal stenosis at L2-L3 primarily due to disc bulging, ligamentum hypertrophy and facet arthropathy, with mild contribution  from superior endplate retropulsion of L3. Interspinous bursitis is also noted at this level. Moderate spinal canal and subarticular stenosis bilaterally at L3-L4, with potential for impingement of the descending right L4 nerve root. Moderate bilateral neural foraminal stenosis at this level. Prior posterior decompression with grade 1 anterolisthesis but patent spinal canal neural foramina at L4-L5. Electronically Signed   By: Maurine Simmering M.D.   On: 05/03/2022 18:38   IR THORACENTESIS ASP PLEURAL SPACE W/IMG GUIDE  Result Date: 05/03/2022 INDICATION: Patient with history of adenocarcinoma of right lung stage IV with right pleural effusion. Request for diagnostic and therapeutic right thoracentesis. EXAM: ULTRASOUND GUIDED DIAGNOSTIC AND THERAPEUTIC RIGHT THORACENTESIS  MEDICATIONS: 10 mL 1 % lidocaine COMPLICATIONS: None immediate. PROCEDURE: An ultrasound guided thoracentesis was thoroughly discussed with the patient and questions answered. The benefits, risks, alternatives and complications were also discussed. The patient understands and wishes to proceed with the procedure. Written consent was obtained. Ultrasound was performed to localize and mark an adequate pocket of fluid in the RIGHT chest. The area was then prepped and draped in the normal sterile fashion. 1% Lidocaine was used for local anesthesia. Under ultrasound guidance a 6 Fr Safe-T-Centesis catheter was introduced. Thoracentesis was performed. The catheter was removed and a dressing applied. FINDINGS: A total of approximately 900 mL of clear, amber fluid was removed. Samples were sent to the laboratory as requested by the clinical team. IMPRESSION: Successful diagnostic and therapeutic RIGHT thoracentesis yielding 900 mL of pleural fluid. Read by: Narda Rutherford, AGNP-BC Electronically Signed   By: Michaelle Birks M.D.   On: 05/03/2022 15:36   DG Chest 1 View  Result Date: 05/03/2022 CLINICAL DATA:  Status post right-sided thoracentesis EXAM: CHEST   1 VIEW COMPARISON:  Radiograph 10/21/2021, CT 04/30/2022 FINDINGS: Unchanged cardiomediastinal silhouette. Decreased right pleural effusion after thoracentesis. No evidence of pneumothorax. Right mid lung mass with adjacent consolidation, and apical pleural nodularity similar to recent CT. Bones are unchanged. IMPRESSION: Decreased right pleural effusion after thoracentesis. No evidence of pneumothorax. Right mid lung mass with adjacent consolidation, similar to recent CT. Electronically Signed   By: Maurine Simmering M.D.   On: 05/03/2022 15:10        Visit Diagnosis: 1. Urinary frequency   2. Adenocarcinoma of right lung, stage 4 ([place])      Orders Placed This Encounter  Procedures   Culture, Urine    Standing Status:   Future    Number of Occurrences:   1    Standing Expiration Date:   05/04/2023   Urinalysis, Complete w Microscopic    Standing Status:   Future    Number of Occurrences:   1    Standing Expiration Date:   05/05/2023   Amb Referral to Palliative Care    Referral Priority:   Urgent    Referral Type:   Consultation    Referral Reason:   Symptom Managment    Referred to Provider:   Jimmy Footman, NP    Number of Visits Requested:   1    All questions were answered. The patient knows to call the clinic with any problems, questions or concerns. No barriers to learning was detected.  I have spent a total of 20 minutes minutes of face-to-face and non-face-to-face time, preparing to see the patient, obtaining and/or reviewing separately obtained history, performing a medically appropriate examination, counseling and educating the patient, ordering tests, documenting clinical information in the electronic health record, and care coordination (communications with other health care professionals or caregivers).    Thank you for allowing me to participate in the care of this patient.    Barrie Folk, PA-C Department of Hematology/Oncology [facility] at [facility] Phone: [phone]  [phone]    05/04/2022 2:04 PM  ADDENDUM: Hematology/Oncology Attending: I had a face-to-face encounter with the patient today.  I reviewed her records, lab, imaging studies and recommended her care plan.  This is a very pleasant 88 years old white female with a stage IV non-small cell lung cancer that was initially treated with a course of concurrent chemoradiation with weekly carboplatin and paclitaxel with partial response.  The  patient started the first cycle of treatment with consolidation immunotherapy with Imfinzi on 04/01/2022.  She was supposed to start cycle #2 few days ago but she was complaining of increasing shortness of breath as well as back pain.  She had treatment for urinary tract infection.  She had repeat CT scan of the chest on 04/30/2022 that showed new moderate to large pleural effusion but stable nodularity and right middle lobe lung mass there was abnormal sclerosis in the right T11 transverse process and sclerosis at the right T4 vertebral body.  The patient underwent ultrasound-guided right thoracentesis with drainage of 900 mL of pleural fluid.  The cytology is still pending.  She also had MRI of the lumbar spine that showed multiple enhancing lesions throughout the lumbar spine at T12 and suspected S1 consistent with metastatic disease.  There was also a new 1.6 cm right adrenal lesion concerning for any metastasis. I had a lengthy discussion with the patient and her daughter Sydell Axon who accompanied her to the visit today.  Her other daughter [name] was available by phone during the visit. I recommended for the patient to see radiation oncology for consideration of palliative radiotherapy to the metastatic bone lesion as well as SBRT to the right adrenal lesion. We discussed her prognosis with and without treatment and the patient and her family will make a decision regarding whether she continue on immunotherapy  or not. For the pain management, we will refer the patient to the palliative care clinic for evaluation and management of her pain issues. If the patient declined future treatment with immunotherapy or she has additional evidence of disease progression, I think palliative care and hospice is a very reasonable option for her. If she decided to continue with additional immunotherapy, I will see her back for follow-up visit in 4 weeks for evaluation before resuming her treatment.  Hopefully during that time she would have completed her palliative radiotherapy. The patient was advised to call immediately if she has any other concerning symptoms in the interval. The total time spent in the appointment was 30 minutes. Disclaimer: This note was dictated with voice recognition software. Similar sounding words can inadvertently be transcribed and may be missed upon review. Eilleen Kempf, MD

## 2022-05-05 LAB — CYTOLOGY - NON PAP

## 2022-05-05 LAB — URINE CULTURE

## 2022-05-06 ENCOUNTER — Other Ambulatory Visit: Payer: Self-pay

## 2022-05-06 ENCOUNTER — Telehealth: Payer: Self-pay

## 2022-05-06 ENCOUNTER — Inpatient Hospital Stay (HOSPITAL_BASED_OUTPATIENT_CLINIC_OR_DEPARTMENT_OTHER): Payer: Medicare Other | Admitting: Nurse Practitioner

## 2022-05-06 DIAGNOSIS — C3491 Malignant neoplasm of unspecified part of right bronchus or lung: Secondary | ICD-10-CM | POA: Diagnosis not present

## 2022-05-06 DIAGNOSIS — G893 Neoplasm related pain (acute) (chronic): Secondary | ICD-10-CM | POA: Diagnosis not present

## 2022-05-06 DIAGNOSIS — R110 Nausea: Secondary | ICD-10-CM

## 2022-05-06 DIAGNOSIS — Z515 Encounter for palliative care: Secondary | ICD-10-CM

## 2022-05-06 DIAGNOSIS — Z7189 Other specified counseling: Secondary | ICD-10-CM

## 2022-05-06 MED ORDER — KETOROLAC TROMETHAMINE 30 MG/ML IJ SOLN: 30.0000 mg | Freq: Once | INTRAMUSCULAR | 0 refills | Status: AC

## 2022-05-06 MED ORDER — ONDANSETRON 8 MG PO TBDP: 8.0000 mg | ORAL_TABLET | Freq: Four times a day (QID) | ORAL | 3 refills | Status: AC | PRN

## 2022-05-06 MED ORDER — HYDROMORPHONE HCL 2 MG PO TABS: 2.0000 mg | ORAL_TABLET | ORAL | 0 refills | Status: DC | PRN

## 2022-05-06 NOTE — Progress Notes (Signed)
Orders for hospice placed per Lexine Baton, NP, [facility]mith from [place] contacted for work up.

## 2022-05-06 NOTE — Progress Notes (Signed)
[place]  [phone] [phone]   Name: [name] Date: 05/06/2022 MRN: [id number]  DOB: January 28, 1930  Patient Care Team: [name], MD as PCP - General (Internal Medicine) Josue Hector, MD as PCP - Cardiology (Cardiology) Pickenpack-Cousar, Carlena Sax, NP as Nurse Practitioner (Nurse Practitioner)   I connected with [name]'s daughter/HCPOA, [name] on 05/06/22 at  8:30 AM EDT by phone and verified that I am speaking with the correct person using two identifiers.   I discussed the limitations, risks, security and privacy concerns of performing an evaluation and management service by telemedicine and the availability of in-person appointments. I also discussed with the patient that there may be a patient responsible charge related to this service. The patient expressed understanding and agreed to proceed.   Other persons participating in the visit and their role in the encounter: [place], RN    Patient's location: [facility]  Provider's location: [place]    Chief Complaint: Pain   INTERVAL HISTORY: [name] is a 88 y.o. female with medical history including stage IV right lung non-small cell lung cancer (12/2021) s/p 6 cycles of chemoradiation.  Palliative ask to see for symptom management and goals of care.  SOCIAL HISTORY:     reports that she has quit smoking. Her smoking use included cigarettes. She has never used smokeless tobacco. She reports that she does not currently use alcohol. She reports that she does not use drugs.  ADVANCE DIRECTIVES:  Patient has a documented advanced directive. Her daughter [name] is designated Scientist, research (medical).   CODE STATUS: DNR  PAST MEDICAL HISTORY: Past Medical History:  Diagnosis Date   Actinic keratoses 07/24/2018   Adhesive capsulitis of shoulder 12/06/2013   Arthritis    Atrial fibrillation ([place]) 07/17/2018   Benign  essential HTN 07/24/2018   Last Assessment & Plan:  She is doing better with adjustments in her blood pressure medication and will continue this monitoring. Last Assessment & Plan:  She is doing better with adjustments in her blood pressure medication and will continue this monitoring.   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   Hx of basal cell carcinoma excision 07/24/2018   Hx of melanoma in situ 07/24/2018   Hx of squamous cell carcinoma of skin 07/24/2018   Hyperlipidemia 07/24/2018   Lentigines 03/06/2019   Myocardial infarction [facility])    Silent Heart Attack   Past myocardial infarction 03/06/2019   Polycythemia    Polycythemia vera ([place]) 07/17/2018   Last Assessment & Plan:  The patient's blood counts remain in the desired range on her current dose of Hydrea.  She will continue this medication without dose modification.  She is moving to [facility] next week and we will help her arrange for necessary follow-up there.  She will return here on an as-needed basis.   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    ALLERGIES:  is allergic to bee venom, sulfa antibiotics, sulfamethoxazole, hydrocodone-acetaminophen, meperidine, morphine, oxycodone hcl, oxycodone-acetaminophen, anesthesia [id number] [propofol], caffeine, hydrocodone, lactose, oxycodone-aspirin, propoxyphene, soy allergy, codeine, latex, and valdecoxib.  MEDICATIONS:  Current  Outpatient Medications  Medication Sig Dispense Refill   HYDROmorphone (DILAUDID) 2 MG tablet Take 1 tablet (2 mg total) by mouth every 2 (two) hours as needed for severe pain. 90 tablet 0   ketorolac (TORADOL) 30 MG/ML injection Inject 1  mL (30 mg total) into the vein once for 1 dose. 1 mL 0   ondansetron (ZOFRAN-ODT) 8 MG disintegrating tablet Take 1 tablet (8 mg total) by mouth every 6 (six) hours as needed for nausea or vomiting. 30 tablet 3   acetaminophen (TYLENOL) 325 MG tablet Take 650 mg by mouth daily as needed for moderate pain or mild pain (4,000 mg daily as needed).     acetaminophen (TYLENOL) 650 MG CR tablet Take 1,300 mg by mouth 2 (two) times daily.     apixaban (ELIQUIS) 5 MG TABS tablet Take 1 tablet (5 mg total) by mouth 2 (two) times daily. Okay to restart this medication on 12/22/2021     EPINEPHrine 0.3 mg/0.3 mL IJ SOAJ injection Inject 0.3 mg into the muscle as needed for anaphylaxis.     fentaNYL (DURAGESIC) 12 MCG/HR Place 1 patch onto the skin every 3 (three) days. 10 patch 0   flecainide (TAMBOCOR) 50 MG tablet Take 1 tablet (50 mg total) by mouth 2 (two) times daily. 180 tablet 2   furosemide (LASIX) 20 MG tablet Take 20 mg by mouth daily.     lidocaine 4 % Place 1 patch onto the skin every 12 (twelve) hours. 12 hours on 12 hours off (Patient not taking: Reported on 05/04/2022)     magnesium hydroxide (MILK OF MAGNESIA) 400 MG/5ML suspension Take 30 mLs by mouth daily.     Menthol, Topical Analgesic, (BIOFREEZE ROLL-ON) 4 % GEL Apply topically.     polyethylene glycol (MIRALAX / GLYCOLAX) 17 g packet Take 17 g by mouth daily.     prochlorperazine (COMPAZINE) 10 MG tablet Take 1 tablet (10 mg total) by mouth every 6 (six) hours as needed. 30 tablet 2   sennosides-docusate sodium (SENOKOT-S) 8.6-50 MG tablet Take 1 tablet by mouth daily.     traMADol (ULTRAM) 50 MG tablet Take 1 tablet (50 mg total) by mouth every 6 (six) hours as needed for up to 7 days. 28 tablet 0   No current facility-administered medications for this visit.    VITAL SIGNS: There were no vitals taken for this visit. There were no vitals filed for this visit.  Estimated body mass index is 19.96 kg/m as calculated from the  following:   Height as of 04/29/22: 5\' 8"  (1.727 m).   Weight as of 05/04/22: 131 lb 4.8 oz (59.6 kg).   PERFORMANCE STATUS (ECOG) : 3 - Symptomatic, >50% confined to bed   IMPRESSION: [name] is not doing well today. Her daughters are expressing concerns that patient is "taking a turn for the worst". [name] is tearful in conversations sharing her mother was saying her good-byes to them on this morning. Patient is having uncontrolled pain, unable to get out of bed, increased weakness and fatigue.   We started her on Fentanyl patch and made adjustments to her pain regimen on Tuesday however orders were not initiated until later yesterday evening.   Family does not wish for patient to suffer. Ms. Quackenbush has been clear in her wishes that she is not interested in pursuing further treatment as her quality of life has not been the best. Her request if for aggressive pain management and focus on her comfort.  We discussed Her current illness and what it means in the larger context of Her on-going co-morbidities. Natural disease trajectory and expectations were discussed.  [name] states family have been in discussions regarding goals of care and they are all in agreement with focusing on her comfort and consideration of hospice support. Emotional support provided.   Education provided on outpatient hospice's goals and philosophy of care. Also reviewed referral process. Daughter verbalized understanding.   We discussed aggressive symptom management given her significant pain. Education provided on use of hydromorphone and toradol. Family verbalized understanding. We will also utilize hospice for assistance with symptom management.   I spoke directly with Adele Barthel, PA regarding patient's status. Facility will plan to move patient into skilled nursing room for better support. They are in agreement with hospice referral as requested by family. Discussed medication changes and administration needed  as soon as possible. Prescriptions will be sent to [facility] as requested by PA.   PLAN: Extensive goals of care discussion with family.  Patient is an uncontrolled pain on today and has also made decision for no further interventions.  She is unclear during family discussions to focus solely on her comfort.  They have requested hospice referral and to aggressively manage her symptoms to prevent any further suffering or discomfort. Referral made to [facility] as requested.  Cheri, RN [facility] liaison) is aware of request. I personally spoke with Elmo Putt, [place] at [facility] and provided update along with medication adjustments.  She verbalized understanding.  Facility is in agreement with hospice support I will plan to move patient to skilled area. Continue with fentanyl patch Hydromorphone 2 mg as needed Zofran 4-8mg  ODT as needed Toradol 30 mg IM x1 All prescriptions have been sent to [facility] as requested by facility. Ongoing support as needed.   Patient expressed understanding and was in agreement with this plan. She also understands that She can call the clinic at any time with any questions, concerns, or complaints.   Any controlled substances utilized were prescribed in the context of palliative care. PDMP has been reviewed.   Time Total: 45 min   Visit consisted of counseling and education dealing with the complex and emotionally intense issues of symptom management and palliative care in the setting of serious and potentially life-threatening illness.Greater than 50%  of this time was spent counseling and coordinating care related to the above assessment and plan.  Alda Lea, AGPCNP-BC  Palliative Medicine Team/[place]

## 2022-05-06 NOTE — Telephone Encounter (Signed)
Called pt daughter [name], gave an update, hospice orders placed, [facility] a hospice rep should be out to see pt this evening. Provided daughter with updates regarding medications, d/c fentanyl patch, dilaudid PRN, and scheduled one time dose of Toradol. (Medication orders communicated to [facility] per Lexine Baton, NP). [name], appreciative with no further questions or concerns at this time.

## 2022-05-06 NOTE — Addendum Note (Signed)
Addended by: Jimmy Footman on: 05/06/2022 01:33 PM   Modules accepted: Orders

## 2022-05-08 ENCOUNTER — Emergency Department (HOSPITAL_COMMUNITY): Payer: Medicare Other

## 2022-05-08 ENCOUNTER — Other Ambulatory Visit: Payer: Self-pay

## 2022-05-08 ENCOUNTER — Encounter (HOSPITAL_COMMUNITY): Payer: Self-pay | Admitting: Pharmacy Technician

## 2022-05-08 ENCOUNTER — Emergency Department (HOSPITAL_COMMUNITY)
Admission: EM | Admit: 2022-05-08 | Discharge: 2022-05-08 | Disposition: A | Discharge status: Skilled Nursing Facility | Payer: Medicare Other | Attending: Emergency Medicine | Admitting: Emergency Medicine

## 2022-05-08 DIAGNOSIS — Z85118 Personal history of other malignant neoplasm of bronchus and lung: Secondary | ICD-10-CM | POA: Insufficient documentation

## 2022-05-08 DIAGNOSIS — M25511 Pain in right shoulder: Secondary | ICD-10-CM | POA: Diagnosis not present

## 2022-05-08 DIAGNOSIS — Z85828 Personal history of other malignant neoplasm of skin: Secondary | ICD-10-CM | POA: Insufficient documentation

## 2022-05-08 DIAGNOSIS — Z853 Personal history of malignant neoplasm of breast: Secondary | ICD-10-CM | POA: Diagnosis not present

## 2022-05-08 DIAGNOSIS — Z7901 Long term (current) use of anticoagulants: Secondary | ICD-10-CM | POA: Insufficient documentation

## 2022-05-08 DIAGNOSIS — S51011A Laceration without foreign body of right elbow, initial encounter: Secondary | ICD-10-CM | POA: Diagnosis present

## 2022-05-08 DIAGNOSIS — C3491 Malignant neoplasm of unspecified part of right bronchus or lung: Secondary | ICD-10-CM | POA: Insufficient documentation

## 2022-05-08 DIAGNOSIS — Z66 Do not resuscitate: Secondary | ICD-10-CM | POA: Diagnosis not present

## 2022-05-08 DIAGNOSIS — Y92129 Unspecified place in nursing home as the place of occurrence of the external cause: Secondary | ICD-10-CM | POA: Diagnosis not present

## 2022-05-08 DIAGNOSIS — W19XXXA Unspecified fall, initial encounter: Secondary | ICD-10-CM

## 2022-05-08 DIAGNOSIS — Z91040 Latex allergy status: Secondary | ICD-10-CM | POA: Diagnosis not present

## 2022-05-08 DIAGNOSIS — I10 Essential (primary) hypertension: Secondary | ICD-10-CM | POA: Insufficient documentation

## 2022-05-08 DIAGNOSIS — Z79899 Other long term (current) drug therapy: Secondary | ICD-10-CM | POA: Insufficient documentation

## 2022-05-08 DIAGNOSIS — R079 Chest pain, unspecified: Secondary | ICD-10-CM | POA: Insufficient documentation

## 2022-05-08 DIAGNOSIS — W06XXXA Fall from bed, initial encounter: Secondary | ICD-10-CM | POA: Diagnosis not present

## 2022-05-08 MED ORDER — ONDANSETRON 4 MG PO TBDP
4.0000 mg | ORAL_TABLET | Freq: Once | ORAL | Status: AC
  Administered 2022-05-08: 4 mg via ORAL
  Filled 2022-05-08: qty 1

## 2022-05-08 MED ORDER — ONDANSETRON 4 MG PO TBDP: 4.0000 mg | ORAL_TABLET | Freq: Three times a day (TID) | ORAL | Status: DC | PRN

## 2022-05-08 MED ORDER — HYDROMORPHONE HCL 2 MG PO TABS: 2.0000 mg | ORAL_TABLET | Freq: Four times a day (QID) | ORAL | 0 refills | Status: AC

## 2022-05-08 MED ORDER — HYDROMORPHONE HCL 2 MG PO TABS
2.0000 mg | ORAL_TABLET | Freq: Once | ORAL | Status: AC
  Administered 2022-05-08: 2 mg via ORAL
  Filled 2022-05-08: qty 1

## 2022-05-08 MED ORDER — ACETAMINOPHEN 325 MG PO TABS: 650.0000 mg | ORAL_TABLET | Freq: Four times a day (QID) | ORAL | 0 refills | Status: AC

## 2022-05-08 MED ORDER — ACETAMINOPHEN 325 MG PO TABS
650.0000 mg | ORAL_TABLET | Freq: Once | ORAL | Status: AC
  Administered 2022-05-08: 650 mg via ORAL
  Filled 2022-05-08: qty 2

## 2022-05-08 MED ORDER — HYDROMORPHONE HCL 2 MG PO TABS: 2.0000 mg | ORAL_TABLET | ORAL | Status: DC | PRN

## 2022-05-08 MED ORDER — ACETAMINOPHEN 325 MG PO TABS: 650.0000 mg | ORAL_TABLET | Freq: Four times a day (QID) | ORAL | Status: DC | PRN

## 2022-05-08 NOTE — ED Notes (Signed)
Daughter became agitated, states they are getting out of here now. Refusing discharge VS.

## 2022-05-08 NOTE — ED Notes (Signed)
Ambulated pt to bedside commode with 2 person assist.

## 2022-05-08 NOTE — ED Notes (Signed)
Pt daughter refusing blood draw at this time. PA notified.

## 2022-05-08 NOTE — ED Provider Notes (Addendum)
[place] DEPT Provider Note   CSN: [id number] Arrival date & time: 05/08/22  6063     History  Chief Complaint  Patient presents with   Lytle Michaels    [name] is a 88 y.o. female.  88 year old female with past medical history of A-fib (on Eliquis), breast/skin/lung cancer, MI, brought in by EMS from [facility] with report of unwitnessed fall, patient found on the floor with a skin tear to her right elbow.  Patient states that she slipped and fell landing on her bottom, did not hit her head, did not lose consciousness.  She reports pain in her back and later reports some pain on the right side of her chest.  Patient believes she was only on the floor for about 30 minutes but is unable to say why she got out of bed, if this was to go to the bathroom or to get up for the day, it is unclear.  She reports multiple falls recently and states her "doctor told (her) if (she) had another one it would do (her) in."       Home Medications Prior to Admission medications   Medication Sig Start Date End Date Taking? Authorizing Provider  acetaminophen (TYLENOL) 325 MG tablet Take 2 tablets (650 mg total) by mouth every 6 (six) hours for 3 days. 05/08/22 05/11/22 Yes Tacy Learn, PA-C  HYDROmorphone (DILAUDID) 2 MG tablet Take 1 tablet (2 mg total) by mouth every 6 (six) hours for 3 days. 05/08/22 05/11/22 Yes Tacy Learn, PA-C  apixaban (ELIQUIS) 5 MG TABS tablet Take 1 tablet (5 mg total) by mouth 2 (two) times daily. Okay to restart this medication on 12/22/2021 12/21/21   Collene Gobble, MD  EPINEPHrine 0.3 mg/0.3 mL IJ SOAJ injection Inject 0.3 mg into the muscle as needed for anaphylaxis. 12/09/21   [provider]  fentaNYL (DURAGESIC) 12 MCG/HR Place 1 patch onto the skin every 3 (three) days. 05/04/22   Pickenpack-Cousar, Carlena Sax, NP  flecainide (TAMBOCOR) 50 MG tablet Take 1 tablet (50 mg total) by mouth 2 (two) times daily. 07/29/21   Josue Hector, MD  furosemide (LASIX) 20 MG tablet Take 20 mg by mouth daily. 08/04/21   [provider]  lidocaine 4 % Place 1 patch onto the skin every 12 (twelve) hours. 12 hours on 12 hours off Patient not taking: Reported on 05/04/2022    [provider]  magnesium hydroxide (MILK OF MAGNESIA) 400 MG/5ML suspension Take 30 mLs by mouth daily.    [provider]  Menthol, Topical Analgesic, (BIOFREEZE ROLL-ON) 4 % GEL Apply topically. 12/19/21   [provider]  ondansetron (ZOFRAN-ODT) 8 MG disintegrating tablet Take 1 tablet (8 mg total) by mouth every 6 (six) hours as needed for nausea or vomiting. 05/06/22   Pickenpack-Cousar, Carlena Sax, NP  polyethylene glycol (MIRALAX / GLYCOLAX) 17 g packet Take 17 g by mouth daily.    [provider]  prochlorperazine (COMPAZINE) 10 MG tablet Take 1 tablet (10 mg total) by mouth every 6 (six) hours as needed. 01/07/22   Heilingoetter, Cassandra L, PA-C  sennosides-docusate sodium (SENOKOT-S) 8.6-50 MG tablet Take 1 tablet by mouth daily.    [provider]      Allergies    Bee venom, Sulfa antibiotics, Sulfamethoxazole, Hydrocodone-acetaminophen, Meperidine, Morphine, Oxycodone hcl, Oxycodone-acetaminophen, Anesthesia s-i-40 [propofol], Caffeine, Hydrocodone, Lactose, Oxycodone-aspirin, Propoxyphene, Soy allergy, Codeine, Latex, and Valdecoxib    Review of Systems  Review of Systems Negative except as per HPI Physical Exam Updated Vital Signs BP (!) 169/71   Pulse 79   Temp 97.7 F (36.5 C) (Oral)   Resp 20   SpO2 96%  Physical Exam Vitals and nursing note reviewed.  Constitutional:      General: She is not in acute distress.    Appearance: She is well-developed. She is not diaphoretic.  HENT:     Head: Normocephalic and atraumatic.     Nose: Nose normal.     Mouth/Throat:     Mouth: Mucous membranes are moist.  Eyes:     Extraocular Movements: Extraocular movements intact.     Pupils:  Pupils are equal, round, and reactive to light.  Neck:     Comments: C-collar in place Cardiovascular:     Rate and Rhythm: Normal rate and regular rhythm.     Pulses: Normal pulses.     Heart sounds: Normal heart sounds.  Pulmonary:     Effort: Pulmonary effort is normal.     Breath sounds: Normal breath sounds.  Abdominal:     Palpations: Abdomen is soft.     Tenderness: There is no abdominal tenderness.  Musculoskeletal:        General: No deformity.     Cervical back: Neck supple. No tenderness or bony tenderness.     Thoracic back: No tenderness or bony tenderness.     Lumbar back: No tenderness or bony tenderness.     Comments: Minor skin tear to right elbow. NO pain with ROM hips, knees. Has pain in right shoulder, states this is her normal.   Skin:    General: Skin is warm and dry.     Findings: No erythema or rash.  Neurological:     Mental Status: She is alert and oriented to person, place, and time.  Psychiatric:        Behavior: Behavior normal.     ED Results / Procedures / Treatments   Labs (all labs ordered are listed, but only abnormal results are displayed) Labs Reviewed - No data to display   EKG EKG Interpretation  Date/Time:  Saturday May 08 2022 07:39:36 EDT Ventricular Rate:  76 PR Interval:  236 QRS Duration: 93 QT Interval:  394 QTC Calculation: 443 R Axis:   -30 Text Interpretation: Sinus rhythm Prolonged PR interval Left axis deviation Anteroseptal infarct, age indeterminate Lateral leads are also involved back in sinus Confirmed by Isla Pence [phone]) on 05/08/2022 8:31:51 AM  Radiology CT LUMBAR SPINE WO CONTRAST  Result Date: 05/08/2022 CLINICAL DATA:  88 year female status post fall. Metastatic lung cancer. Extensive lumbosacral metastases on recent MRI. EXAM: CT LUMBAR SPINE WITHOUT CONTRAST TECHNIQUE: Multidetector CT imaging of the lumbar spine was performed without intravenous contrast administration. Multiplanar CT image  reconstructions were also generated. RADIATION DOSE REDUCTION: This exam was performed according to the departmental dose-optimization program which includes automated exposure control, adjustment of the mA and/or kV according to patient size and/or use of iterative reconstruction technique. COMPARISON:  Lumbar MRI 05/03/2022. FINDINGS: Segmentation: Normal, the same numbering system on the recent MRI. Alignment: Stable from the recent MRI. Vertebrae: Benign T11 vertebral body hemangioma. But superimposed sclerotic right T11 posterior element metastasis. T12 anterior and right vertebral metastasis better demonstrated by MRI. L1 appears stable with osteopenia. Sclerotic L2 metastasis redemonstrated right greater than left. L3 pathologic fracture appears stable including bony retropulsion. L4 vertebra appears stable with osteopenia. L5 vertebral body metastasis better demonstrated by MRI.  S1-S2 sacral metastasis also better demonstrated by MRI. No new osseous abnormality identified. Grossly intact visible sacrum and SI joints. Paraspinal and other soft tissues: At least moderate layering right pleural effusion has simple fluid density at the lung base. No pericardial effusion. Extensive Aortoiliac calcified atherosclerosis. Normal caliber abdominal aorta. Negative other visible noncontrast abdominal viscera. Lumbar paraspinal soft tissues are stable. Disc levels: Stable from the MRI 5 days ago. Multifactorial spinal stenosis at L2-L3 in part due to pathologic L3 fracture with retropulsion. Previous posterior decompression of L4-L5 and L5-S1. IMPRESSION: 1. Thoracic, lumbar, and sacral spinal metastatic disease is stable by CT, was better demonstrated on the recent MRI. Stable L3 pathologic fracture with retropulsion and spinal stenosis. 2.  No acute traumatic injury identified. 3. At least moderate layering right pleural effusion has simple fluid density at the lung base. 4. Aortic Atherosclerosis (ICD10-I70.0).  Electronically Signed   By: Genevie Ann M.D.   On: 05/08/2022 09:25   CT Cervical Spine Wo Contrast  Result Date: 05/08/2022 CLINICAL DATA:  88 year female status post fall. Metastatic lung cancer. EXAM: CT CERVICAL SPINE WITHOUT CONTRAST TECHNIQUE: Multidetector CT imaging of the cervical spine was performed without intravenous contrast. Multiplanar CT image reconstructions were also generated. RADIATION DOSE REDUCTION: This exam was performed according to the departmental dose-optimization program which includes automated exposure control, adjustment of the mA and/or kV according to patient size and/or use of iterative reconstruction technique. COMPARISON:  CT cervical spine 12/06/2021. FINDINGS: Alignment: Cervicothoracic junction alignment is within normal limits. Stable cervical lordosis since April. Bilateral posterior element alignment is within normal limits. Skull base and vertebrae: Advanced C1-C2 degeneration. Visualized skull base is intact. No atlanto-occipital dissociation. However, there is new sclerosis of the right occipital condyle since April suspicious for osseous metastatic disease (series 7, image 56). Heterogeneous bone mineralization in the odontoid appears to be stable and degenerative in nature. New sclerosis of the C4 vertebra including the left posterior elements, right vertebral body. Questionable new sclerosis of the left C5 and C6 posterior elements. Soft tissues and spinal canal: No visible canal hematoma. Negative visible noncontrast neck soft tissues. Disc levels: Cervical spine degeneration appears stable since April. There is underlying chronic C7-T1 ankylosis. Upper chest: Grossly stable visible upper thoracic vertebrae. Increased pleural effusion in the right lung apex, at least moderate now. Negative left lung apex. IMPRESSION: 1. No acute traumatic injury identified in the cervical spine. 2. But evidence of Progressed Skeletal Metastases since April this year, including at the  right occipital condyle, C4 vertebra. 3. Increased right pleural effusion since April. Electronically Signed   By: Genevie Ann M.D.   On: 05/08/2022 09:20   CT Head Wo Contrast  Result Date: 05/08/2022 CLINICAL DATA:  88 year female status post fall. Metastatic lung cancer. EXAM: CT HEAD WITHOUT CONTRAST TECHNIQUE: Contiguous axial images were obtained from the base of the skull through the vertex without intravenous contrast. RADIATION DOSE REDUCTION: This exam was performed according to the departmental dose-optimization program which includes automated exposure control, adjustment of the mA and/or kV according to patient size and/or use of iterative reconstruction technique. COMPARISON:  Brain MRI 01/12/2022.  Head CT 12/06/2021. FINDINGS: Brain: Stable cerebral volume. No midline shift, ventriculomegaly, mass effect, evidence of mass lesion, intracranial hemorrhage or evidence of cortically based acute infarction. Patchy and confluent bilateral cerebral white matter hypodensity appears stable since April. Vascular: Calcified atherosclerosis at the skull base. No suspicious intracranial vascular hyperdensity. Skull: Stable since April. No acute or suspicious  osseous lesion identified. Sinuses/Orbits: Visualized paranasal sinuses and mastoids are clear. Other: No acute orbit or scalp soft tissue injury identified. IMPRESSION: 1. No acute traumatic injury identified. 2. No acute intracranial abnormality, stable non contrast CT appearance of the brain since April. Electronically Signed   By: Genevie Ann M.D.   On: 05/08/2022 09:16   DG Chest 1 View  Result Date: 05/08/2022 CLINICAL DATA:  88 year old female with chest pain. Pain under the right breast. Recent right side thoracentesis. Stage IV lung cancer. EXAM: CHEST  1 VIEW COMPARISON:  Post thoracentesis portable chest 05/03/2022, and earlier. FINDINGS: Portable AP semi upright view at 0843 hours. No pneumothorax. Some recurrence of right pleural effusion since  05/03/2022. Superimposed masslike opacity and architectural distortion at the right hilum appears stable. Stable cardiac size and mediastinal contours. Left lung appears stable and negative. Stable visualized osseous structures. IMPRESSION: Some recurrence of right pleural effusion since 05/03/2022. Otherwise stable abnormal right lung with no pneumothorax or other acute cardiopulmonary abnormality identified. Electronically Signed   By: Genevie Ann M.D.   On: 05/08/2022 08:58    Procedures Procedures    Medications Ordered in ED Medications  acetaminophen (TYLENOL) tablet 650 mg (has no administration in time range)  HYDROmorphone (DILAUDID) tablet 2 mg (has no administration in time range)  ondansetron (ZOFRAN-ODT) disintegrating tablet 4 mg (has no administration in time range)  acetaminophen (TYLENOL) tablet 650 mg (650 mg Oral Given 05/08/22 0856)  HYDROmorphone (DILAUDID) tablet 2 mg (2 mg Oral Given 05/08/22 1014)  ondansetron (ZOFRAN-ODT) disintegrating tablet 4 mg (4 mg Oral Given 05/08/22 1014)    ED Course/ Medical Decision Making/ A&P                           Medical Decision Making Amount and/or Complexity of Data Reviewed Labs: ordered. Radiology: ordered.  Risk OTC drugs. Prescription drug management.   This patient presents to the ED for concern of injuries from an unwitnessed fall, this involves an extensive number of treatment options, and is a complaint that carries with it a high risk of complications and morbidity.  The differential diagnosis includes intracranial injury, fracture, UTI, rhabdomyolysis    Co morbidities that complicate the patient evaluation  Stage 4 right lung non-small cell lung cancer, a fib, HTN, breast cancer, skin cancer, MI, polycythemia vera    Additional history obtained:  Additional history obtained from daughter who arrives at bedside after initial evaluation-reports family and patient ever submitted decision to transition to hospice  and request assistance with pain management.  Patient is scheduled to follow-up with hospice on Monday.  Provided with prescription for fentanyl patch. External records from outside source obtained and reviewed including note from palliative care encounter dated 05/04/2022   Imaging Studies ordered:  I ordered imaging studies including CT head, C-spine, chest x-ray, CT L-spine. I independently visualized and interpreted imaging which showed progressive disease process without acute injury I agree with the radiologist interpretation   Cardiac Monitoring: / EKG:  The patient was maintained on a cardiac monitor.  I personally viewed and interpreted the cardiac monitored which showed an underlying rhythm of: Sinus rhythm, rate 76   Consultations Obtained:  I requested consultation with the Benjamine Mola with [facility],  and discussed lab and imaging findings as well as pertinent plan - they recommend: per family- patient will have intake appointment with hospice tomorrow Discussed with Dr. Gilford Raid, ER attending who has seen the patient.  Problem List / ED Course / Critical interventions / Medication management  88 year old female brought in by EMS from [facility] for evaluation after a fall.  Patient had a unwitnessed fall today, thought to have brief downtime.  Patient's daughters arrived at bedside after her initial evaluation.  Family is agreeable with imaging for injury however declines lab work, understands this to be used to evaluate for urinary tract infection, anemia, metabolic abnormality, rhabdomyolysis or cardiac event.  CT imaging and chest x-ray with worsening disease process from patient's known metastatic cancer.  Family is concerned about patient's pain management at her facility as well as her safety due to her limited ambulatory status.  Patient has pain medications which were ordered as as needed however family feels patient is unable to advocate for herself and  request her medications as needed therefore her pain is not managed.  Patient does have an order for fentanyl patch however when this was tried the other day, patient had hallucinations.  Question if this was related to taking tramadol with her patch, either way after conversation with the family is determined not to trial fentanyl patch again at this time.  Patient does do well with Tylenol and her oral Dilaudid.  These medications were given to her in the emergency room with adequate pain control.  Patient was ambulatory with minimal assistance to bedside commode however it is understandable that patient would not be able to ambulate down the hall as expected at her facility to use the restroom.  Family has been in touch with hospice of the [place] with plan for hospice to evaluate patient tomorrow.  For the time being, plan is for patient to return to her facility with patient's daughter to stay with her at the bedside to assist as needed.  Patient is pending movement to a skilled bed at her facility. Patient is provided with prescription for scheduled Dilaudid and Tylenol, changed from the prior as needed order as requested by family.  We did discuss concerns for narcotic pain medications resulting in respiratory depression and further falls. I ordered medication including dilaudid, Zofran, tylenol  for pain  Reevaluation of the patient after these medicines showed that the patient improved I have reviewed the patients home medicines and have made adjustments as needed   Social Determinants of Health:  Lives at nursing facility, currently in a memory care for pending admission to skilled nursing when bed space allows   Test / Admission - Considered:  Labs considered, declined         Final Clinical Impression(s) / ED Diagnoses Final diagnoses:  Fall, initial encounter  Skin tear of right elbow without complication, initial encounter    Rx / DC Orders ED Discharge Orders           Ordered    HYDROmorphone (DILAUDID) 2 MG tablet  Every 6 hours        05/08/22 1324    acetaminophen (TYLENOL) 325 MG tablet  Every 6 hours        05/08/22 1324              Tacy Learn, PA-C 05/08/22 1359    Tacy Learn, PA-C 05/08/22 1401    Isla Pence, MD 05/08/22 1432

## 2022-05-08 NOTE — Discharge Instructions (Addendum)
Dilaudid by mouth every 6 hours as prescribed.  Assist to bathroom to prevent falls and injuries.  Tylenol every 6 hours as prescribed.

## 2022-05-08 NOTE — ED Notes (Signed)
Pt called out complaining of pain under R breast. PA notified.

## 2022-05-08 NOTE — ED Triage Notes (Signed)
Pt here via ems from [facility] after unwitnessed fall. Pt found on the floor. Skin tear to R elbow. Pt denies hitting head (alert and oriented X3). Family and staff state pt at neuro baseline. Pt is on eliquis. No head trauma noted. Does arrive in [place] due to complaint of back and neck pain. VSS with ems.

## 2022-05-23 DEATH — deceased

## 2022-05-24 ENCOUNTER — Telehealth: Payer: Self-pay

## 2022-05-24 NOTE — Telephone Encounter (Signed)
Call received from pts daughter, [name], advising pt passed away on Thursday 06-06-2022.  [name] asked that I extend her gratitude to everyone involved in her care. She states her mom loved [place] all and she felt like she was given the best care by the [facility] and she is forever grateful.

## 2022-05-27 ENCOUNTER — Other Ambulatory Visit: Payer: Medicare Other

## 2022-05-27 ENCOUNTER — Encounter: Payer: Medicare Other | Admitting: Nutrition

## 2022-05-27 ENCOUNTER — Ambulatory Visit: Payer: Medicare Other

## 2022-05-27 ENCOUNTER — Ambulatory Visit: Payer: Medicare Other | Admitting: Physician Assistant

## 2022-06-02 IMAGING — US US EXTREM LOW VENOUS
3 series · 13 of 24 positions shown · non-contrast
Comparison: None.

CLINICAL DATA: Asymmetric edema in both lower extremities

EXAM:
Bilateral LOWER EXTREMITY VENOUS DOPPLER ULTRASOUND
TECHNIQUE: Gray-scale sonography with compression, as well as color and duplex
ultrasound, were performed to evaluate the deep venous system(s)
from the level of the common femoral vein through the popliteal and
proximal calf veins.

[Series 1: us extrem low venous · 0.06mm/px · 1 of 1 slices shown (1 of 3)]
[im 1/1]
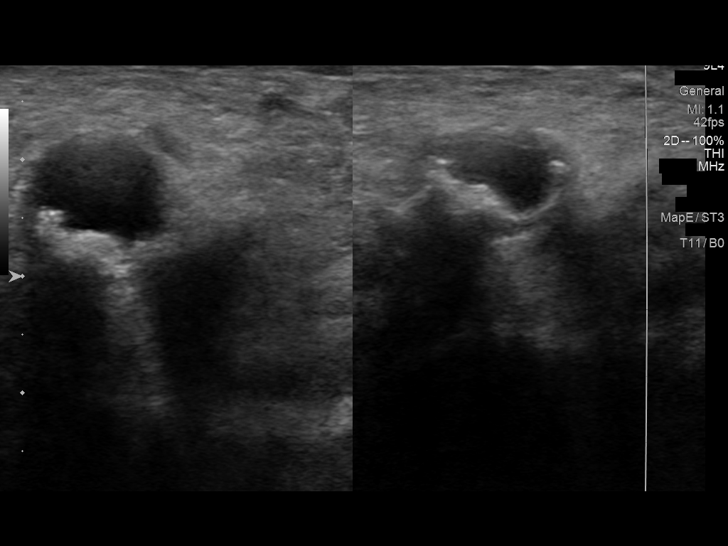

[Series 2: us extrem low venous · 0.06mm/px · 5 of 40 slices shown (2 of 3)]
[im 5/40]
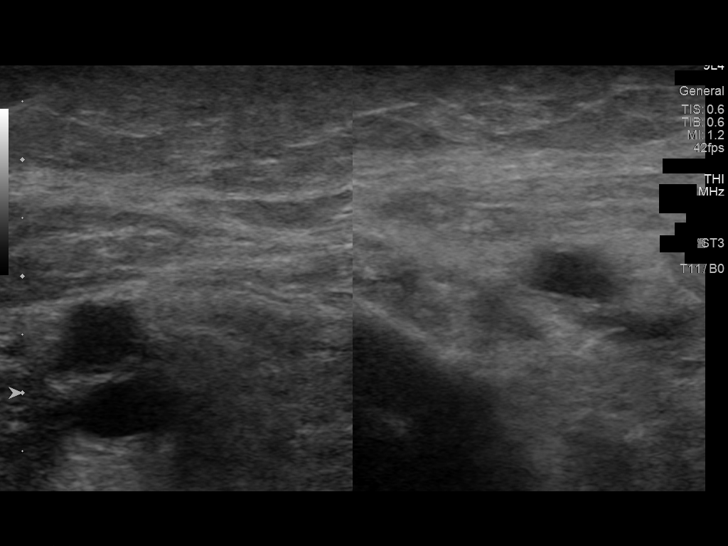
[im 14/40]
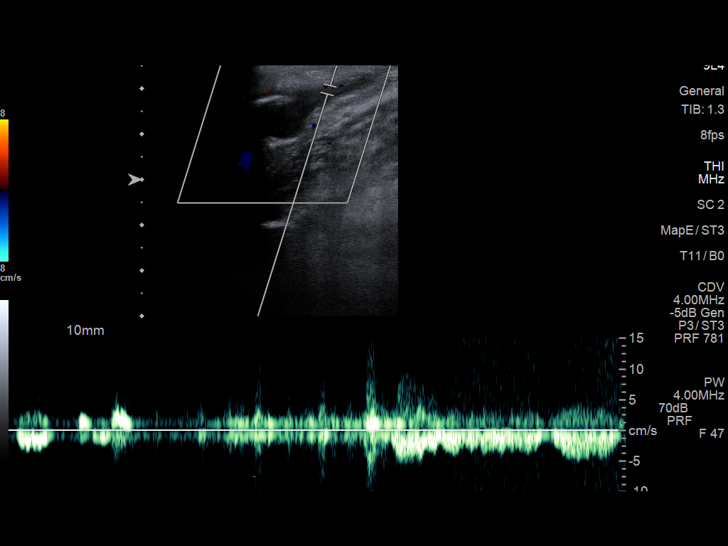
[im 22/40]
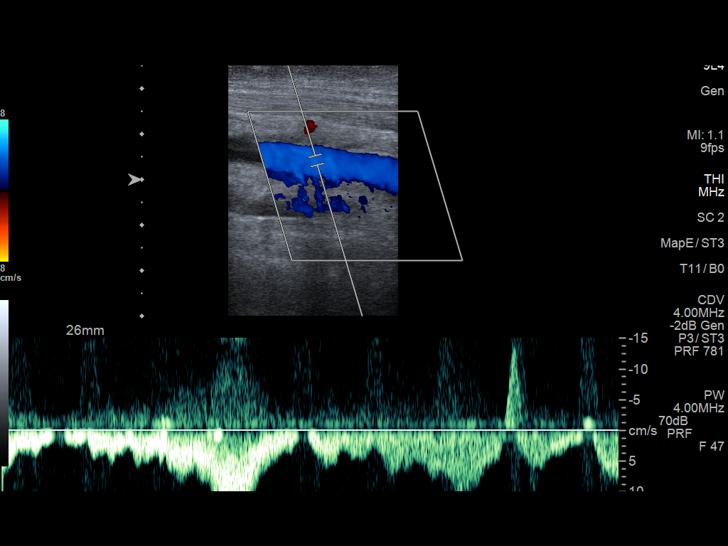
[im 31/40]
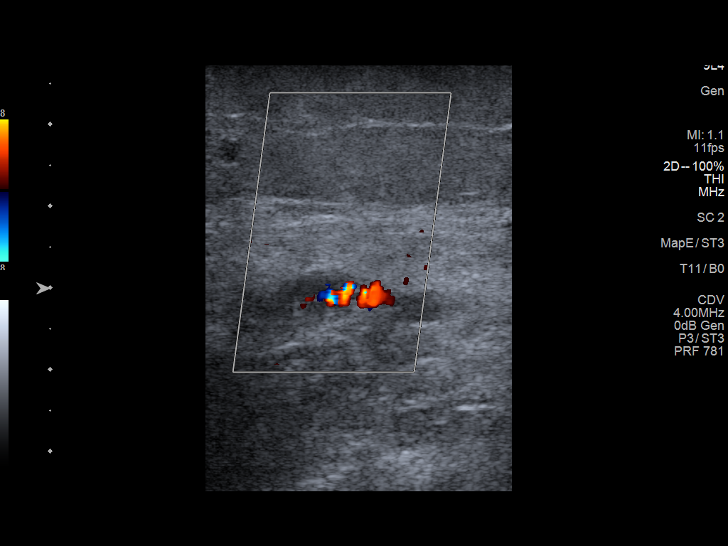
[im 40/40]
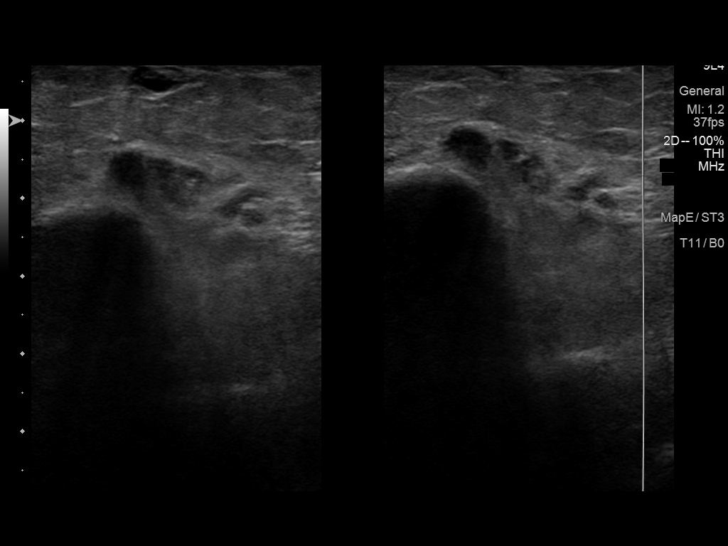

[Series 3: us extrem low venous · 0.06mm/px · 7 of 47 slices shown (3 of 3)]
[im 4/47]
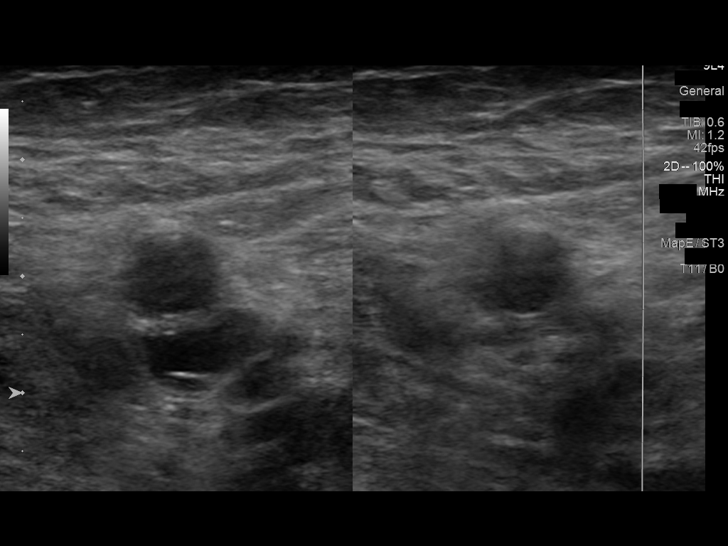
[im 8/47]
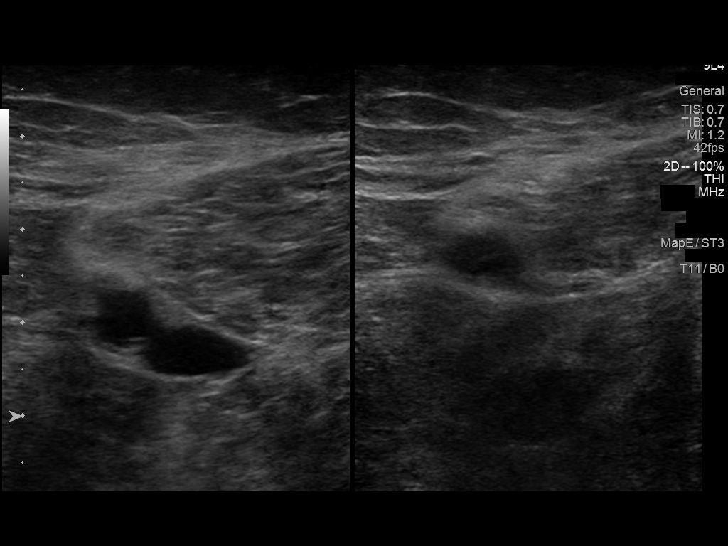
[im 16/47]
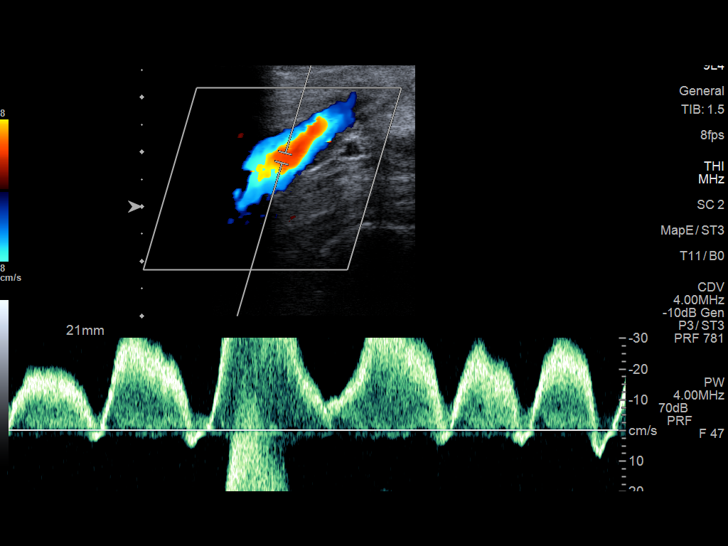
[im 24/47]
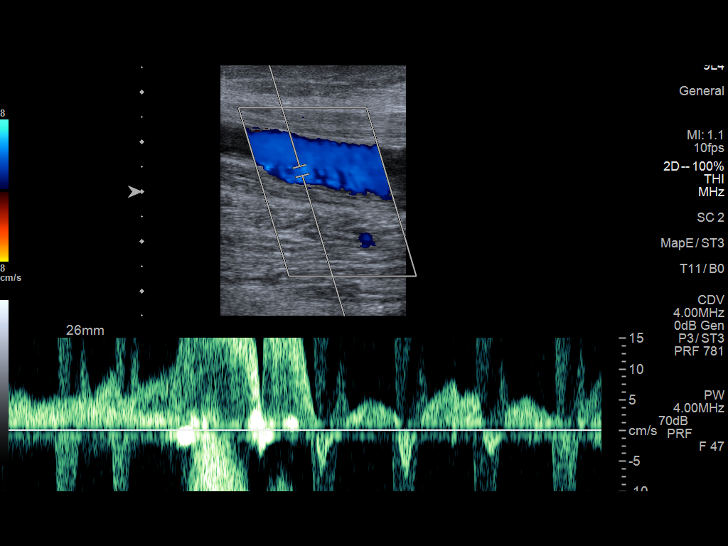
[im 31/47]
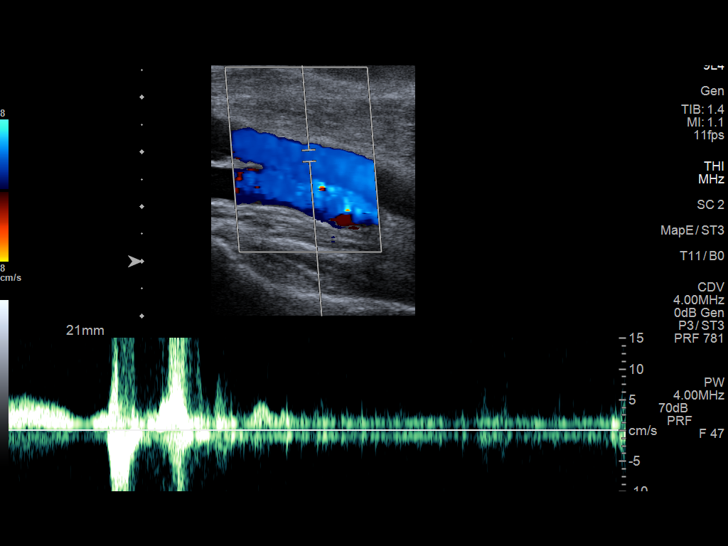
[im 39/47]
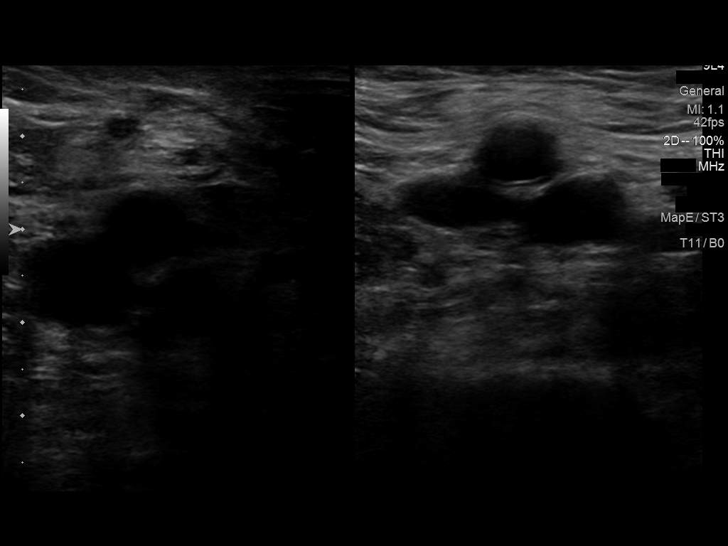
[im 47/47]
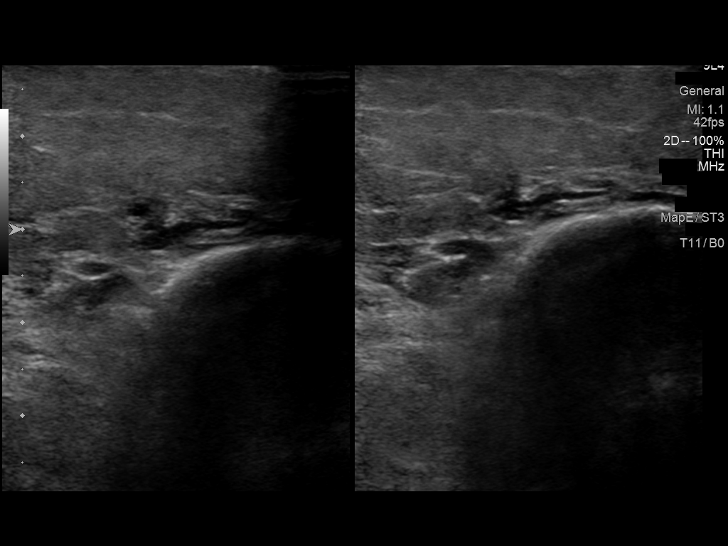

[13 of 24 positions shown; findings below may reference images not displayed]

FINDINGS: VENOUS

Normal compressibility of the common femoral, superficial femoral,
and popliteal veins, as well as the visualized calf veins. Peroneal
veins are not adequately visualized. Visualized portions of profunda
femoral vein and great saphenous vein unremarkable. No filling
defects to suggest DVT on grayscale or color Doppler imaging.
Doppler waveforms show normal direction of venous flow, normal
respiratory plasticity and response to augmentation.

OTHER

None.

Limitations: none
IMPRESSION: There is no evidence of deep venous thrombosis in both lower
extremities.

## 2022-06-24 ENCOUNTER — Ambulatory Visit: Payer: Medicare Other | Admitting: Internal Medicine

## 2022-06-24 ENCOUNTER — Other Ambulatory Visit: Payer: Medicare Other

## 2022-06-24 ENCOUNTER — Ambulatory Visit: Payer: Medicare Other

## 2022-09-10 IMAGING — CT CT CHEST SUPER D W/O CM
1 of 2 series · 14 of 32 positions shown, 18 images · non-contrast
Comparison: PET-CT 05/01/2021.  Chest CT 04/04/2021 and 01/13/2021.

CLINICAL DATA: Mass of right middle lobe. Chronic cough. History of
bilateral breast cancer with mastectomy in 9404 and 7335.

EXAM:
CT CHEST WITHOUT CONTRAST
TECHNIQUE: Multidetector CT imaging of the chest was performed using thin slice
collimation for electromagnetic bronchoscopy planning purposes,
without intravenous contrast.
RADIATION DOSE REDUCTION: This exam was performed according to the
departmental dose-optimization program which includes automated
exposure control, adjustment of the mA and/or kV according to
patient size and/or use of iterative reconstruction technique.

[Series 3: super d · axial · 0.72mm/px · z∈[-373,-61]mm · 14 of 436 slices shown, 18 images]
[im 23/436  mediastinal]
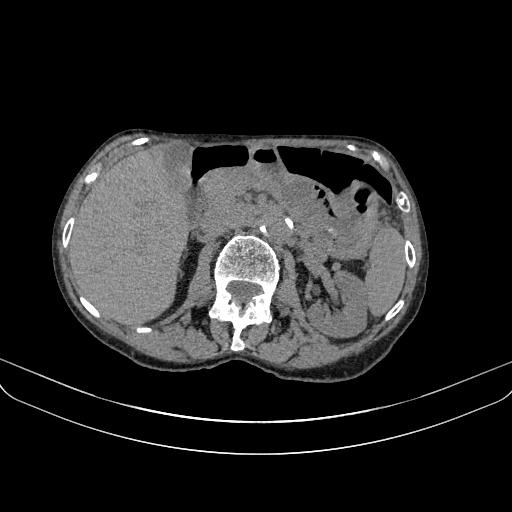
[im 23/436  lung]
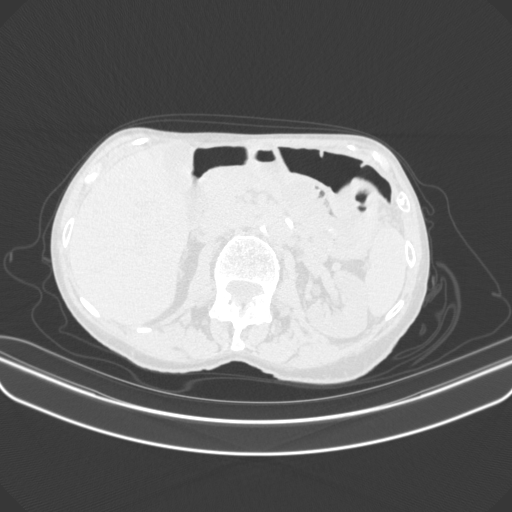
[im 69/436  lung]
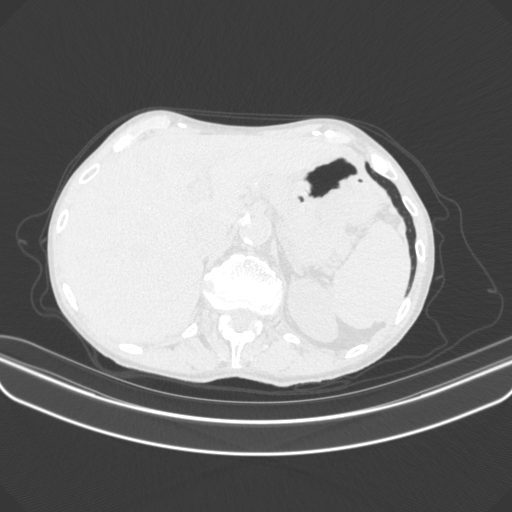
[im 92/436  lung]
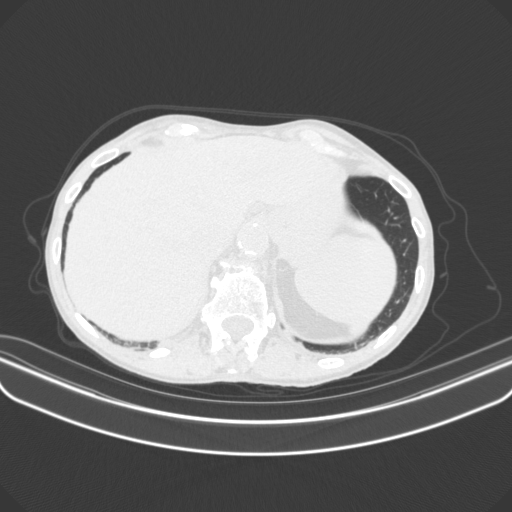
[im 138/436  lung]
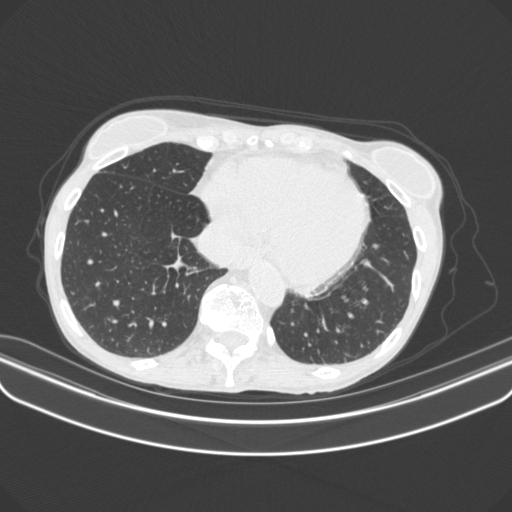
[im 146/436  mediastinal]
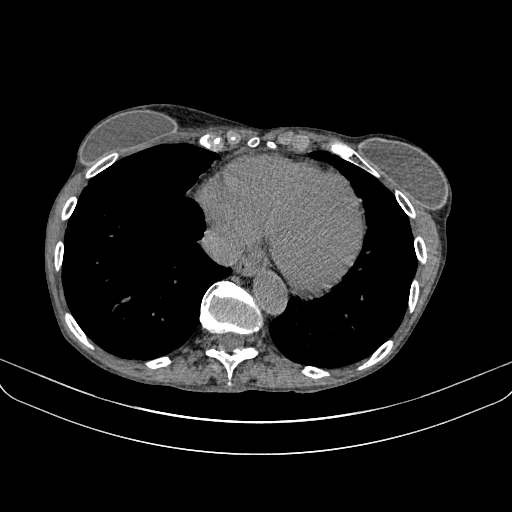
[im 146/436  lung]
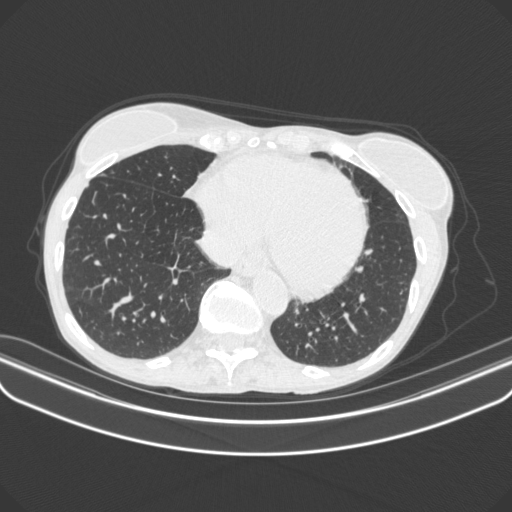
[im 184/436  lung]
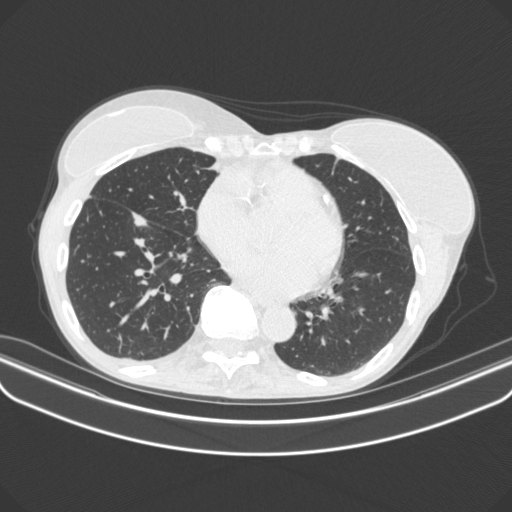
[im 206/436  lung]
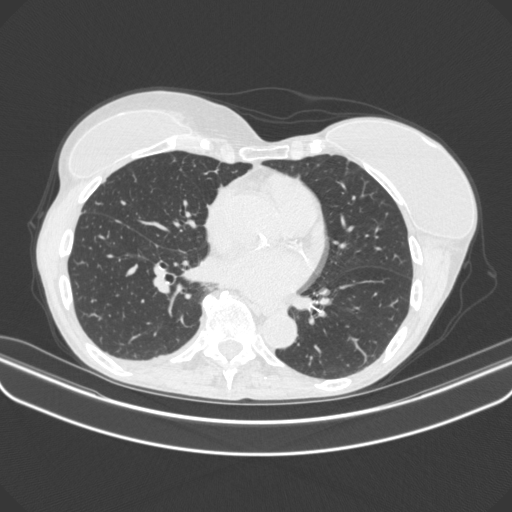
[im 229/436  lung]
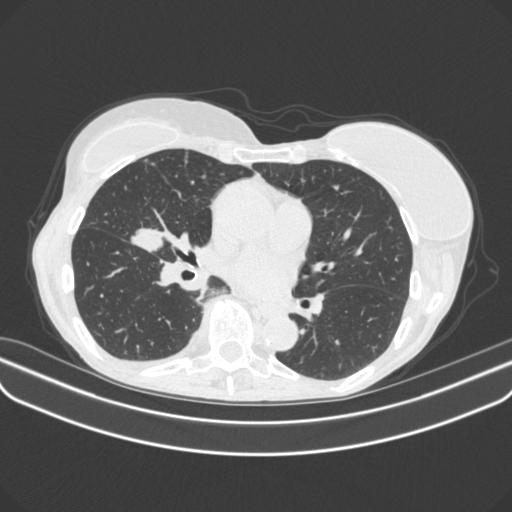
[im 252/436  mediastinal]
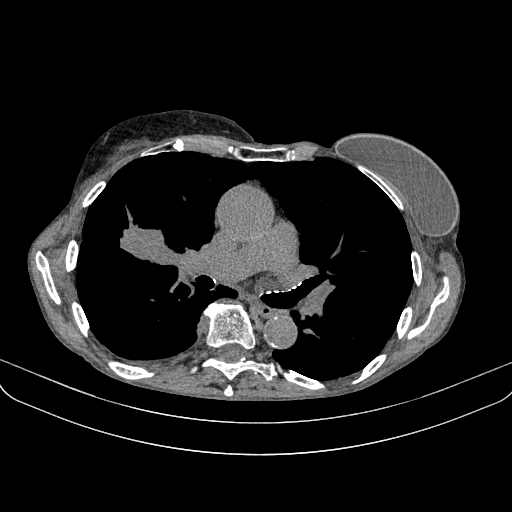
[im 252/436  lung]
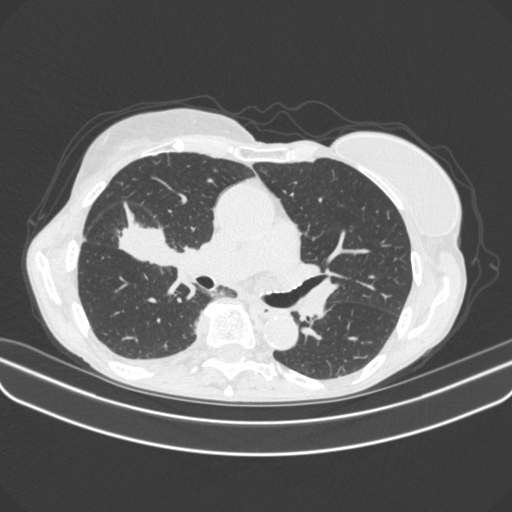
[im 291/436  lung]
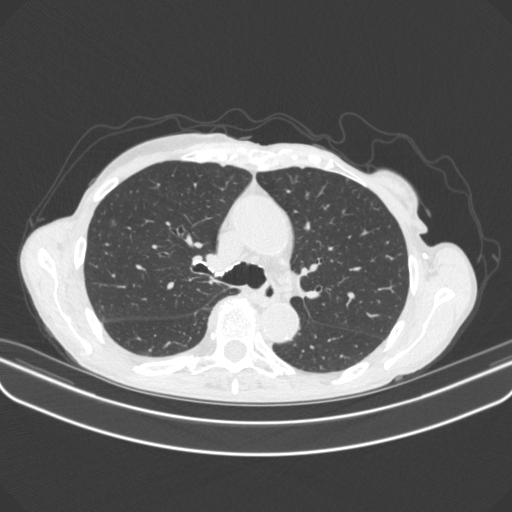
[im 298/436  lung]
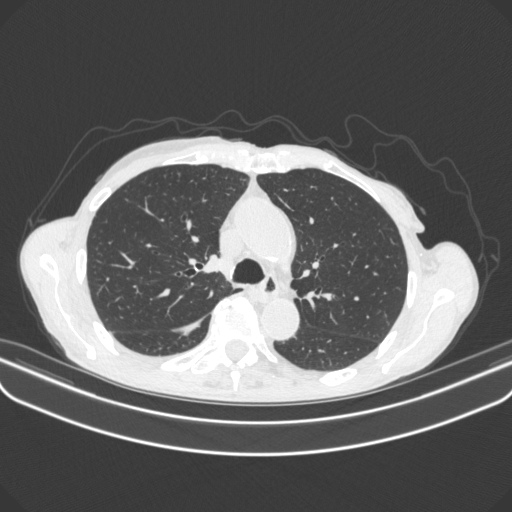
[im 344/436  lung]
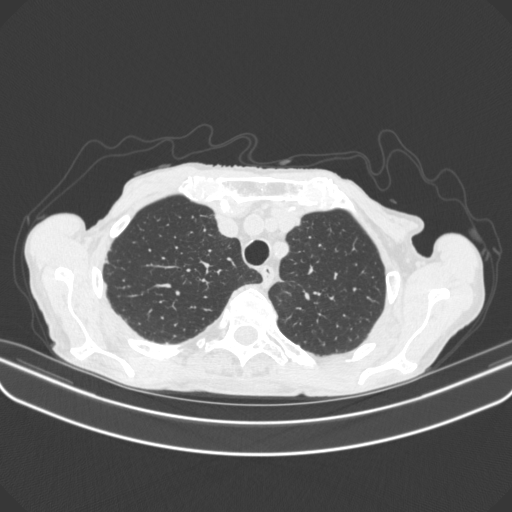
[im 367/436  mediastinal]
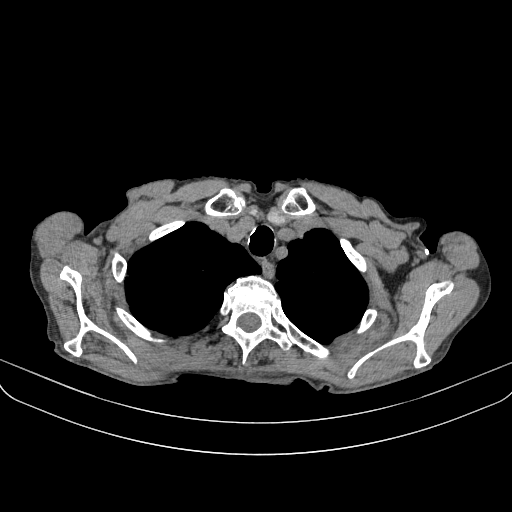
[im 367/436  lung]
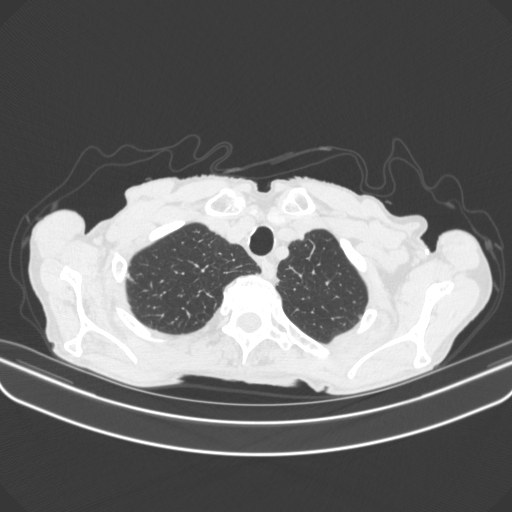
[im 413/436  lung]
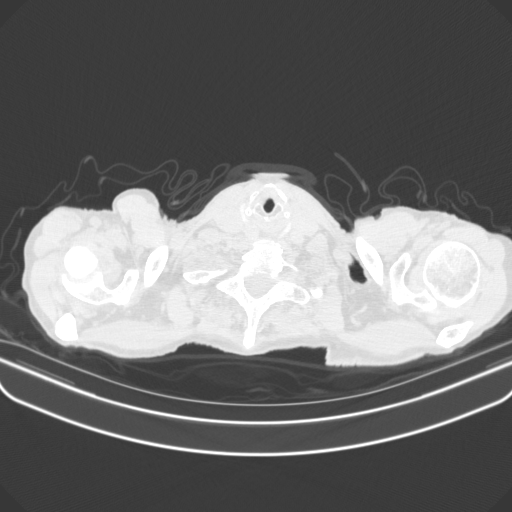

[14 of 32 positions shown; findings below may reference images not displayed]

FINDINGS: Cardiovascular: Diffuse atherosclerosis of the aorta, great vessels
and coronary arteries again noted. No acute vascular findings on
noncontrast imaging. The heart size is normal. There is no
pericardial effusion.

Mediastinum/Nodes: There are no enlarged mediastinal, hilar or
axillary lymph nodes.Hilar assessment is limited by the lack of
intravenous contrast, although the hilar contours appear unchanged.
Previous left axillary node dissection. The thyroid gland, trachea
and esophagus demonstrate no significant findings.

Lungs/Pleura: No significant pleural effusion, although there is
increased pleural thickening medially in the right hemithorax
adjacent to the midthoracic spine. The previously demonstrated mass
involving the posterior aspect of the right middle lobe and adjacent
fissures has mildly enlarged and demonstrates medial extension,
contacting the right hilum. This now measures up to 4.6 x 2.5 cm on
image 76/6 (previously 4.1 x 1.7 cm). This lesion measures
approximately 3.0 cm on sagittal image 54/5 (previously 2.3 cm). In
addition, there is increased nodularity along the inferior aspect of
the right major fissure, measuring up to 1.6 x 0.5 cm on image
101/6. 4 mm right lower lobe part solid nodule on image 115/6 and
subpleural solid right lower lobe nodule measuring 3 mm on image
109/6 are unchanged. No other new or enlarging nodules identified.

Upper abdomen: The visualized upper abdomen appears unchanged,
without suspicious findings.

Musculoskeletal/Chest wall: There is no chest wall mass or
suspicious osseous finding. Bilateral breast implants are noted.
There is mild thoracic spondylosis.
IMPRESSION: 1. Imaging for bronchoscopy planning and guidance.
2. Enlarging pleural based mass involving the right middle lobe and
adjacent fissures with associated increased fissural and paraspinal
pleural thickening, suspicious for mesothelioma or other pleural
based malignancy (potentially metastatic breast cancer).
3. Additional small right pulmonary nodules are unchanged. No
suspicious findings within the left hemithorax.
4. No evidence of thoracic adenopathy.
5. Coronary and Aortic Atherosclerosis ([id number].X).

## 2022-11-10 IMAGING — DX DG CHEST 1V PORT
1 series · 1 of 1 positions shown · non-contrast
Comparison: 11/23/2021 and previous

CLINICAL DATA: S/P bronchoscopy with biopsy [id number], right side per
- RN Tech wore surgical mask/pt had no mask

EXAM:
PORTABLE CHEST - 1 VIEW

[chest]
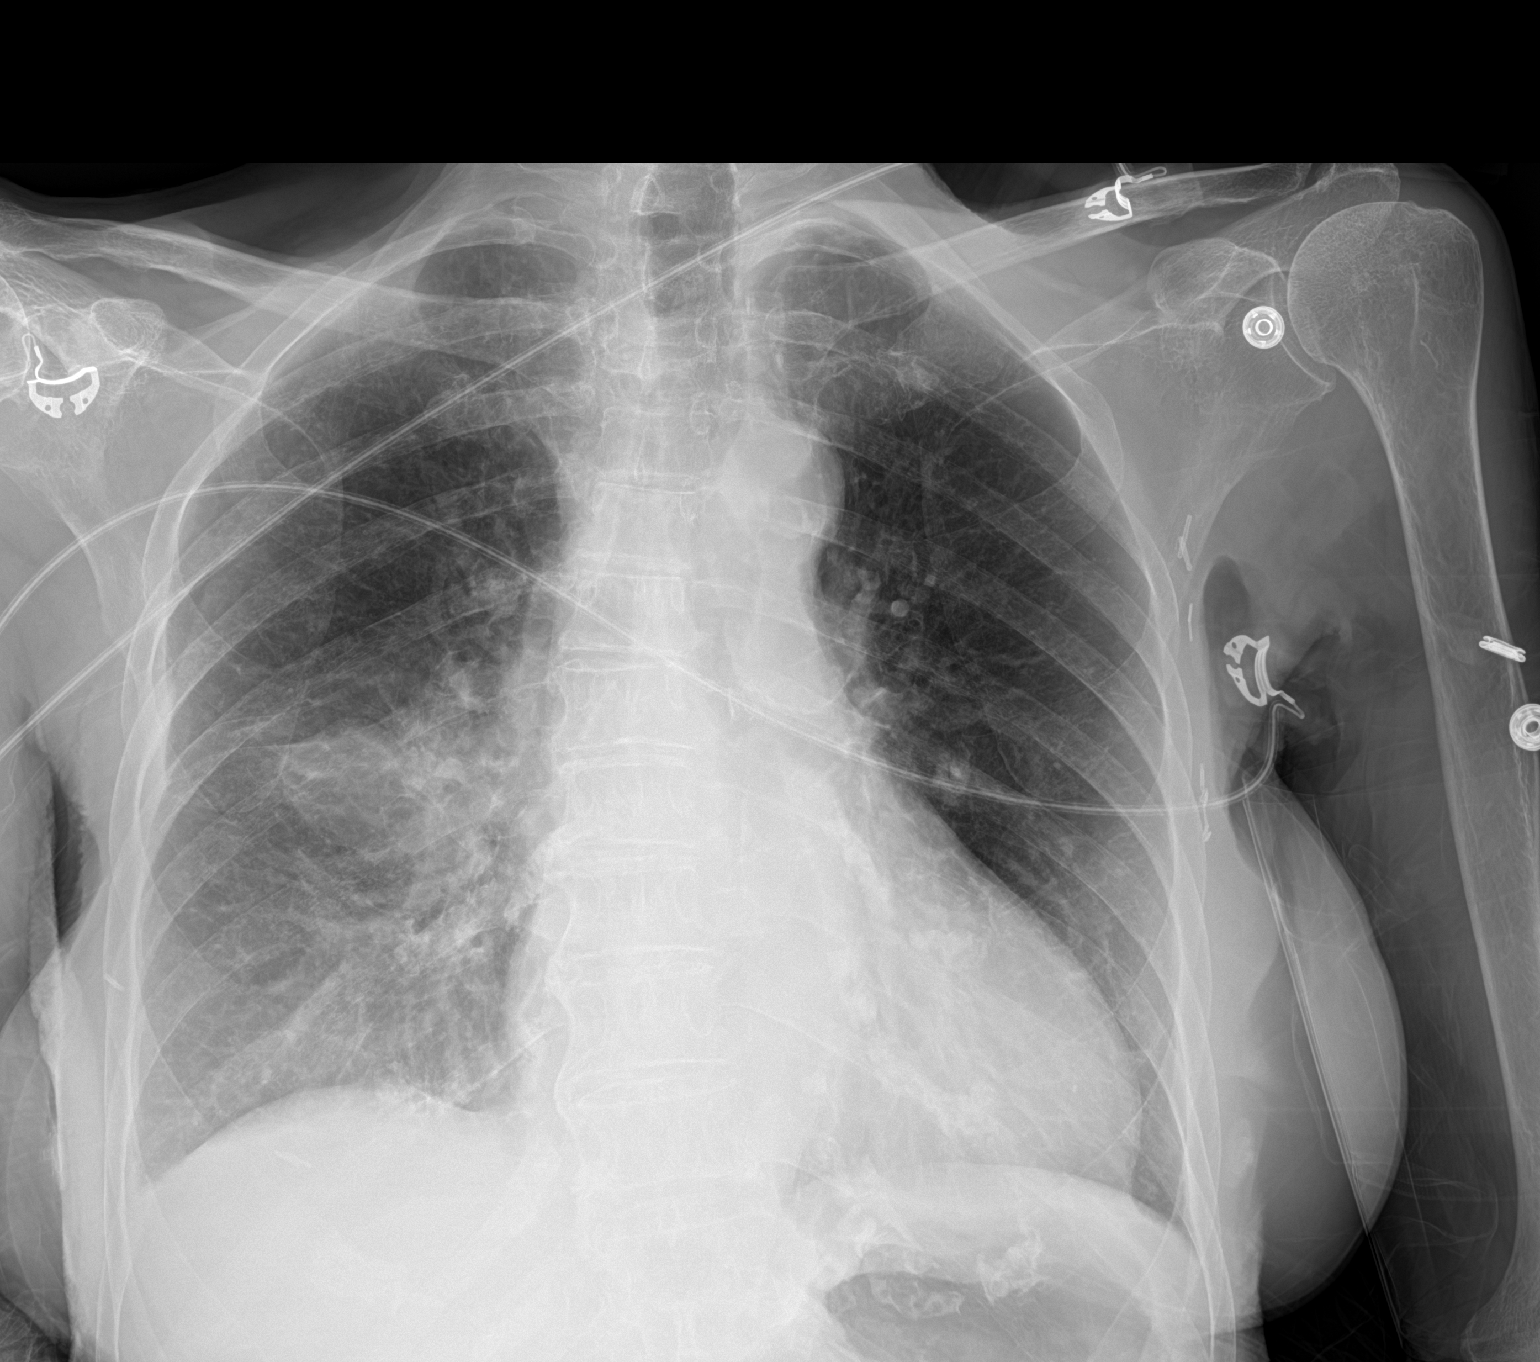

[1 of 1 positions shown; findings below may reference images not displayed]

FINDINGS: No pneumothorax. A skin fold overlies the scapula but lung markings
are seen peripherally. Right hilar mass is again noted, margins
slightly less conspicuous. There are some hazy interstitial
opacities in the mid right lung and infrahilar region conceivably
regional alveolar hemorrhage from recent biopsy. Left lung remains
clear.

Heart size and mediastinal contours are within normal limits. Aortic
Atherosclerosis ([id number]).

No effusion.

Visualized bones unremarkable.  Surgical clips left axilla.
IMPRESSION: No pneumothorax post biopsy
